# Patient Record
Sex: Female | Born: 1950 | Race: White | Hispanic: No | Marital: Married | Smoking: Former smoker
Health system: Southern US, Community
[De-identification: ages and names within clinical notes are randomized; demographics above are authoritative.]

## PROBLEM LIST (undated history)

## (undated) DIAGNOSIS — F32A Depression, unspecified: Secondary | ICD-10-CM

## (undated) DIAGNOSIS — F329 Major depressive disorder, single episode, unspecified: Secondary | ICD-10-CM

## (undated) DIAGNOSIS — I499 Cardiac arrhythmia, unspecified: Secondary | ICD-10-CM

## (undated) DIAGNOSIS — G20A1 Parkinson's disease without dyskinesia, without mention of fluctuations: Secondary | ICD-10-CM

## (undated) DIAGNOSIS — R519 Headache, unspecified: Secondary | ICD-10-CM

## (undated) DIAGNOSIS — R413 Other amnesia: Secondary | ICD-10-CM

## (undated) DIAGNOSIS — M1990 Unspecified osteoarthritis, unspecified site: Secondary | ICD-10-CM

## (undated) DIAGNOSIS — Z923 Personal history of irradiation: Secondary | ICD-10-CM

## (undated) DIAGNOSIS — Z8489 Family history of other specified conditions: Secondary | ICD-10-CM

## (undated) DIAGNOSIS — R51 Headache: Secondary | ICD-10-CM

## (undated) DIAGNOSIS — E039 Hypothyroidism, unspecified: Secondary | ICD-10-CM

## (undated) DIAGNOSIS — G20 Parkinson's disease: Secondary | ICD-10-CM

## (undated) DIAGNOSIS — I10 Essential (primary) hypertension: Secondary | ICD-10-CM

## (undated) DIAGNOSIS — C801 Malignant (primary) neoplasm, unspecified: Secondary | ICD-10-CM

## (undated) DIAGNOSIS — C50919 Malignant neoplasm of unspecified site of unspecified female breast: Secondary | ICD-10-CM

## (undated) HISTORY — DX: Major depressive disorder, single episode, unspecified: F32.9

## (undated) HISTORY — DX: Depression, unspecified: F32.A

## (undated) HISTORY — DX: Parkinson's disease without dyskinesia, without mention of fluctuations: G20.A1

## (undated) HISTORY — PX: OTHER SURGICAL HISTORY: SHX169

## (undated) HISTORY — DX: Parkinson's disease: G20

## (undated) HISTORY — PX: DILATION AND CURETTAGE OF UTERUS: SHX78

## (undated) HISTORY — DX: Unspecified osteoarthritis, unspecified site: M19.90

---

## 2004-02-22 ENCOUNTER — Ambulatory Visit: Payer: Self-pay | Admitting: Obstetrics and Gynecology

## 2005-04-02 ENCOUNTER — Ambulatory Visit: Payer: Self-pay | Admitting: Obstetrics and Gynecology

## 2006-04-08 ENCOUNTER — Ambulatory Visit: Payer: Self-pay | Admitting: Obstetrics and Gynecology

## 2007-06-02 ENCOUNTER — Ambulatory Visit: Payer: Self-pay | Admitting: Obstetrics and Gynecology

## 2008-06-04 ENCOUNTER — Ambulatory Visit: Payer: Self-pay | Admitting: Obstetrics and Gynecology

## 2009-07-12 ENCOUNTER — Ambulatory Visit: Payer: Self-pay | Admitting: Obstetrics and Gynecology

## 2009-07-15 ENCOUNTER — Emergency Department: Payer: Self-pay | Admitting: Emergency Medicine

## 2010-01-06 ENCOUNTER — Encounter: Payer: Self-pay | Admitting: Urology

## 2010-01-09 ENCOUNTER — Encounter: Payer: Self-pay | Admitting: Urology

## 2010-02-09 ENCOUNTER — Encounter: Payer: Self-pay | Admitting: Urology

## 2010-07-07 ENCOUNTER — Ambulatory Visit: Payer: Self-pay | Admitting: Obstetrics and Gynecology

## 2010-07-29 ENCOUNTER — Ambulatory Visit: Payer: Self-pay | Admitting: Obstetrics and Gynecology

## 2011-05-06 ENCOUNTER — Ambulatory Visit: Payer: Self-pay | Admitting: Obstetrics and Gynecology

## 2011-05-06 DIAGNOSIS — I10 Essential (primary) hypertension: Secondary | ICD-10-CM

## 2011-05-06 LAB — BASIC METABOLIC PANEL
Anion Gap: 11 (ref 7–16)
BUN: 19 mg/dL — ABNORMAL HIGH (ref 7–18)
Calcium, Total: 9.3 mg/dL (ref 8.5–10.1)
Co2: 26 mmol/L (ref 21–32)
Creatinine: 0.96 mg/dL (ref 0.60–1.30)
EGFR (Non-African Amer.): >60
Glucose: 85 mg/dL (ref 65–99)
Osmolality: 285 (ref 275–301)
Sodium: 142 mmol/L (ref 136–145)

## 2011-05-06 LAB — CBC
HCT: 39.5 % (ref 35.0–47.0)
HGB: 13.3 g/dL (ref 12.0–16.0)
MCH: 30.7 pg (ref 26.0–34.0)
MCHC: 33.7 g/dL (ref 32.0–36.0)
Platelet: 274 10*3/uL (ref 150–440)
WBC: 6.9 10*3/uL (ref 3.6–11.0)

## 2011-05-11 ENCOUNTER — Ambulatory Visit: Payer: Self-pay | Admitting: Obstetrics and Gynecology

## 2011-08-04 ENCOUNTER — Ambulatory Visit: Payer: Self-pay | Admitting: Obstetrics and Gynecology

## 2011-08-11 ENCOUNTER — Ambulatory Visit: Payer: Self-pay | Admitting: Obstetrics and Gynecology

## 2013-08-23 ENCOUNTER — Encounter: Payer: Self-pay | Admitting: Internal Medicine

## 2013-09-02 ENCOUNTER — Ambulatory Visit: Payer: Self-pay | Admitting: Internal Medicine

## 2013-09-07 DIAGNOSIS — M519 Unspecified thoracic, thoracolumbar and lumbosacral intervertebral disc disorder: Secondary | ICD-10-CM | POA: Insufficient documentation

## 2013-09-09 ENCOUNTER — Encounter: Payer: Self-pay | Admitting: Internal Medicine

## 2013-09-19 ENCOUNTER — Ambulatory Visit: Payer: Self-pay | Admitting: Internal Medicine

## 2013-10-02 DIAGNOSIS — E538 Deficiency of other specified B group vitamins: Secondary | ICD-10-CM | POA: Insufficient documentation

## 2013-10-11 DIAGNOSIS — M4310 Spondylolisthesis, site unspecified: Secondary | ICD-10-CM | POA: Insufficient documentation

## 2013-11-17 ENCOUNTER — Encounter: Payer: Self-pay | Admitting: Surgery

## 2013-12-10 ENCOUNTER — Encounter: Payer: Self-pay | Admitting: Surgery

## 2014-01-09 ENCOUNTER — Encounter: Payer: Self-pay | Admitting: Surgery

## 2014-02-09 DIAGNOSIS — C801 Malignant (primary) neoplasm, unspecified: Secondary | ICD-10-CM

## 2014-02-09 HISTORY — DX: Malignant (primary) neoplasm, unspecified: C80.1

## 2014-02-09 HISTORY — PX: BREAST LUMPECTOMY: SHX2

## 2014-04-02 ENCOUNTER — Encounter: Payer: Self-pay | Admitting: Neurology

## 2014-04-10 ENCOUNTER — Encounter
Admit: 2014-04-10 | Disposition: A | Discharge status: Home / Self Care | Payer: Self-pay | Attending: Neurology | Admitting: Neurology

## 2014-05-11 ENCOUNTER — Encounter
Admit: 2014-05-11 | Disposition: A | Discharge status: Home / Self Care | Payer: Self-pay | Attending: Neurology | Admitting: Neurology

## 2014-06-03 NOTE — H&P (Signed)
PATIENT NAME:  Kathy Bean, Kathy Bean MR#:  [id number] DATE OF BIRTH:  13-Mar-1950  DATE OF ADMISSION:  05/11/2011  PREOPERATIVE DIAGNOSIS: Postmenopausal bleeding.   HISTORY: Kathy Bean is a 64 year old married white female, para 2-0-0-2, using Femhrt daily for hormone replacement therapy, who presents for surgical evaluation of postmenopausal bleeding the end of February. Patient underwent ultrasound and endometrial biopsy in early March. Ultrasound on 04/20/2011 revealed uterus measuring 5.61 x 4.62 x 4.20 cm. The endometrial stripe was 5.4 mm in thickness with associated fluid within the endometrial cavity. Subsequent endometrial biopsy that had been performed on 04/08/2011 revealed benign fragments of weakly proliferative to atrophic endometrium with a pathology recommendation to have additional sampling to confirm definitive diagnosis. Patient has not had any further bleeding since that episode but she is counseled to undergo endometrial sampling through dilation and curettage.   PAST MEDICAL HISTORY:  1. Menopause.  2. Depression.  3. Hypertension.  4. Osteoarthritis of the hip.  5. Seasonal allergies.   PAST SURGICAL HISTORY: Hysteroscopy with dilation and curettage.   PAST OB HISTORY: Para 2-0-0-2, spontaneous vaginal delivery x2.   FAMILY HISTORY: Positive for depression. Positive for heart disease in mom. Positive for breast cancer in paternal aunt. No history of colon or ovary cancer in the family.   SOCIAL HISTORY: Patient does not smoke. She quit cigarettes in 2001. She does drink alcohol socially. She does not use drugs.   PAST GYN HISTORY: No history of abnormal Pap smears. No history of abnormal mammograms.   REVIEW OF SYSTEMS: Patient denies recent illness. She denies history of coagulopathy. She denies history of reactive airway disease.   MEDICATIONS: 1. Femhrt 1 tablet daily.  2. Tussionex 5 mL q.12 hours p.r.n.  3. Citalopram 20 mg daily. 4. Triamterene  hydrochlorothiazide 37.5/25, 1 tablet daily.   DRUG ALLERGIES: None.    PHYSICAL EXAMINATION:  VITAL SIGNS: Height 5 feet 8 inches, weight 183, blood pressure 128/93, heart rate 94, BMI 28.   GENERAL: Patient is a pleasant well-appearing white female with normal affect. She is alert and oriented.   HEENT: Oropharynx clear.   NECK: Supple. No thyromegaly or adenopathy.   LUNGS: Clear.   HEART: Regular rate and rhythm without murmur.   ABDOMEN: Soft, nontender. No hepatosplenomegaly. No pelvic masses. No hernias.   PELVIC: External genitalia normal. BUS normal. Vagina has decreased estrogen effect. Cervix is parous. Uterus is midplane and of normal size and shape. It is mobile. It is nontender. Adnexa are without palpable masses.   RECTAL: External rectal exam is normal.   SKIN: Without rash.   MUSCULOSKELETAL: Normal.   NEUROLOGIC: Normal.   IMPRESSION:  1. Postmenopausal bleeding.  2. Benign endometrial biopsy but limited with recommendation to obtain further sampling.  3. Slightly prominent endometrial stripe with fluid collection within the endometrial cavity of unclear significance.   PLAN: Hysteroscopy with dilation and curettage. Date of surgery 05/11/2011.    CONSENT NOTE: Kathy Bean is to undergo hysteroscopy with dilation and curettage for evaluation of postmenopausal bleeding on 05/11/2011. She is understanding of the planned procedure and is aware of and is accepting of all surgical risks which include but are not limited to bleeding, infection, uterine perforation with pelvic organ injury requiring repair, anesthesia risks, and death. All questions are answered. Informed consent is given. Patient is ready and willing to proceed with surgery as scheduled.  ____________________________ Kathy Slim Angelissa Supan, MD mad:cms D: 05/08/2011 11:20:19 ET T: 05/08/2011 12:41:07 ET JOB#: [id number]  cc: Kathy Done A.  Skyy Mcknight, MD, <Dictator> Kathy Slim Kerrick Miler  MD ELECTRONICALLY SIGNED 05/09/2011 9:23

## 2014-06-03 NOTE — Op Note (Signed)
PATIENT NAME:  Kathy Bean, BURBANO MR#:  [id number] DATE OF BIRTH:  Jul 06, 1950  DATE OF PROCEDURE:  05/11/2011  PREOPERATIVE DIAGNOSES:  1. Postmenopausal bleeding.  2. Abnormal fluid collection in the endometrial cavity on ultrasound.   POSTOPERATIVE DIAGNOSES: 1. Postmenopausal bleeding.  2. Abnormal fluid collection in the endometrial cavity on ultrasound.  3. Uterine prolapse.  4. Traction cystocele, second-degree.   OPERATIVE PROCEDURES:  1. Hysteroscopy.  2. Dilation and curettage of the endometrium.   SURGEON: Alanda Slim. Houa Nie, MD   FIRST ASSISTANT: None.   ANESTHESIA: General.   INDICATIONS: [name] is a 64 year old menopausal female who presents for surgical management of postmenopausal bleeding. Preoperative endometrial biopsy was benign but scant. Further tissue diagnosis is recommended. Ultrasound preoperatively revealed an endometrial cavity with fluid collection and an endometrial thickness of 5.4 mm.   FINDINGS AT SURGERY: The patient has a gynecoid pelvis. Uterus is prolapsed to within 2 cm of the introitus. There is a second degree traction cystocele present. The uterus sounded to 8 cm. The endometrial cavity appeared to have atrophic endometrial tissue.   DESCRIPTION OF PROCEDURE: The patient was brought to the operating room where she was placed in the supine position. General anesthesia was induced without difficulty. She was placed in the dorsal lithotomy position using the candy-cane stirrups. A Betadine perineal intravaginal prep and drape was performed in the standard fashion. Red Robinson catheter was used to drain 100 mL of urine from the bladder. A Sims retractor was placed into the vagina and a single-tooth tenaculum was placed on the anterior lip of the cervix. The cervix was sounded to 8 cm. The Hanks dilators were used up to a #20 [place] caliber to dilate the endocervical canal. The [facility] hysteroscope using lactated Ringer's as irrigant was used to  survey the endometrial cavity. Scant tissue was noted without any significant intrauterine lesions. Both smooth and serrated curettes were then used following hysteroscopy to curettage the endometrial cavity. Scant tissue was obtained. Repeat hysteroscopy revealed no significant residual tissue left behind. All instrumentation was removed from the vagina. The patient was then awakened, mobilized, and taken to the recovery room in satisfactory condition.   The patient is an excellent transvaginal hysterectomy, anterior colporrhaphy candidate if indicated.   ____________________________ Alanda Slim. Kamara Allan, MD mad:drc D: 05/11/2011 14:12:10 ET T: 05/11/2011 15:06:28 ET JOB#: [id number]  cc: Hassell Done A. Kristeena Meineke, MD, <Dictator> Alanda Slim Indi Willhite MD ELECTRONICALLY SIGNED 05/19/2011 13:32

## 2014-10-11 HISTORY — PX: BREAST BIOPSY: SHX20

## 2014-10-16 ENCOUNTER — Other Ambulatory Visit: Payer: Self-pay | Admitting: Internal Medicine

## 2014-10-16 DIAGNOSIS — Z1231 Encounter for screening mammogram for malignant neoplasm of breast: Secondary | ICD-10-CM

## 2014-10-23 ENCOUNTER — Ambulatory Visit
Admission: RE | Admit: 2014-10-23 | Discharge: 2014-10-23 | Disposition: A | Discharge status: Home / Self Care | Payer: Commercial Managed Care - PPO | Source: Ambulatory Visit | Attending: Internal Medicine | Admitting: Internal Medicine

## 2014-10-23 DIAGNOSIS — Z1231 Encounter for screening mammogram for malignant neoplasm of breast: Secondary | ICD-10-CM | POA: Insufficient documentation

## 2014-10-26 ENCOUNTER — Other Ambulatory Visit: Payer: Self-pay | Admitting: Internal Medicine

## 2014-10-26 DIAGNOSIS — R928 Other abnormal and inconclusive findings on diagnostic imaging of breast: Secondary | ICD-10-CM

## 2014-11-02 ENCOUNTER — Ambulatory Visit
Admission: RE | Admit: 2014-11-02 | Discharge: 2014-11-02 | Disposition: A | Discharge status: Home / Self Care | Payer: Commercial Managed Care - PPO | Source: Ambulatory Visit | Attending: Internal Medicine | Admitting: Internal Medicine

## 2014-11-02 DIAGNOSIS — R928 Other abnormal and inconclusive findings on diagnostic imaging of breast: Secondary | ICD-10-CM

## 2014-11-02 DIAGNOSIS — N63 Unspecified lump in breast: Secondary | ICD-10-CM | POA: Insufficient documentation

## 2014-11-06 ENCOUNTER — Other Ambulatory Visit: Payer: Self-pay | Admitting: Internal Medicine

## 2014-11-06 DIAGNOSIS — N63 Unspecified lump in unspecified breast: Secondary | ICD-10-CM

## 2014-11-08 ENCOUNTER — Ambulatory Visit
Admission: RE | Admit: 2014-11-08 | Discharge: 2014-11-08 | Disposition: A | Discharge status: Home / Self Care | Payer: Commercial Managed Care - PPO | Source: Ambulatory Visit | Attending: Internal Medicine | Admitting: Internal Medicine

## 2014-11-08 ENCOUNTER — Other Ambulatory Visit: Payer: Self-pay | Admitting: Diagnostic Radiology

## 2014-11-08 DIAGNOSIS — N63 Unspecified lump in unspecified breast: Secondary | ICD-10-CM

## 2014-11-10 DIAGNOSIS — C50919 Malignant neoplasm of unspecified site of unspecified female breast: Secondary | ICD-10-CM

## 2014-11-10 HISTORY — DX: Malignant neoplasm of unspecified site of unspecified female breast: C50.919

## 2014-11-15 LAB — SURGICAL PATHOLOGY

## 2014-11-20 ENCOUNTER — Other Ambulatory Visit: Payer: Self-pay | Admitting: Surgery

## 2014-11-20 ENCOUNTER — Inpatient Hospital Stay: Payer: Commercial Managed Care - PPO | Attending: Oncology | Admitting: Oncology

## 2014-11-20 ENCOUNTER — Encounter: Payer: Self-pay | Admitting: Oncology

## 2014-11-20 VITALS — BP 120/84 | HR 79 | Temp 96.8°F | Resp 18 | Ht 64.76 in | Wt 180.1 lb

## 2014-11-20 DIAGNOSIS — Z87891 Personal history of nicotine dependence: Secondary | ICD-10-CM | POA: Diagnosis not present

## 2014-11-20 DIAGNOSIS — N6320 Unspecified lump in the left breast, unspecified quadrant: Secondary | ICD-10-CM

## 2014-11-20 DIAGNOSIS — Z79899 Other long term (current) drug therapy: Secondary | ICD-10-CM | POA: Insufficient documentation

## 2014-11-20 DIAGNOSIS — F418 Other specified anxiety disorders: Secondary | ICD-10-CM | POA: Insufficient documentation

## 2014-11-20 DIAGNOSIS — C50312 Malignant neoplasm of lower-inner quadrant of left female breast: Secondary | ICD-10-CM | POA: Diagnosis not present

## 2014-11-20 DIAGNOSIS — M1990 Unspecified osteoarthritis, unspecified site: Secondary | ICD-10-CM | POA: Diagnosis not present

## 2014-11-20 DIAGNOSIS — C50912 Malignant neoplasm of unspecified site of left female breast: Secondary | ICD-10-CM

## 2014-11-20 DIAGNOSIS — Z803 Family history of malignant neoplasm of breast: Secondary | ICD-10-CM | POA: Insufficient documentation

## 2014-11-20 DIAGNOSIS — I10 Essential (primary) hypertension: Secondary | ICD-10-CM | POA: Insufficient documentation

## 2014-11-20 DIAGNOSIS — G20 Parkinson's disease: Secondary | ICD-10-CM

## 2014-11-20 NOTE — Progress Notes (Signed)
  Oncology Nurse Navigator Documentation    Navigator Encounter Type: Initial MedOnc;Treatment;Education (11/20/14 1600)   Treatment Phase: Treatment (11/20/14 1600) Barriers/Navigation Needs: Education (11/20/14 1600) Education: Newly Diagnosed Cancer Education (11/20/14 1600) Interventions: Education Method (11/20/14 1600)     Education Method: Teach-back;Verbal;Written (11/20/14 1600)  Support Groups/Services: Breast (11/20/14 1600)   Time Spent with Patient: 60 (11/20/14 1600)  Met with patient , husband at initial Med-Onc visit with Dr. Grayland Ormond  Coordinated pre-admit testing appointment.  Gave Breast Cancer Treatment Handbook/folder with hospital services.

## 2014-11-21 ENCOUNTER — Telehealth: Payer: Self-pay | Admitting: *Deleted

## 2014-11-21 NOTE — Telephone Encounter (Signed)
Call placed to patient to discuss Oncotype testing that was ordered yesterday. Patient states per insurance she would have high co-pay. Per discussion with Dr. Grayland Ormond test is not of necessity to guide treatment in this particular case, patient states she will place call to [facility] to let them know to halt processing.

## 2014-11-21 NOTE — Telephone Encounter (Signed)
Called to report that Dr Grayland Ormond ordered a lab test was ordered and the lab called her and told her how much it would cost her and asked if she wanted the test run. She is requesting that she be called back to explain what was ordere dand what is going on

## 2014-11-22 ENCOUNTER — Encounter
Admission: RE | Admit: 2014-11-22 | Discharge: 2014-11-22 | Disposition: A | Discharge status: Home / Self Care | Payer: Commercial Managed Care - PPO | Source: Ambulatory Visit | Attending: Surgery | Admitting: Surgery

## 2014-11-22 ENCOUNTER — Other Ambulatory Visit: Payer: Self-pay | Admitting: Surgery

## 2014-11-22 DIAGNOSIS — C50912 Malignant neoplasm of unspecified site of left female breast: Secondary | ICD-10-CM | POA: Insufficient documentation

## 2014-11-22 DIAGNOSIS — Z01818 Encounter for other preprocedural examination: Secondary | ICD-10-CM | POA: Diagnosis not present

## 2014-11-22 DIAGNOSIS — N6320 Unspecified lump in the left breast, unspecified quadrant: Secondary | ICD-10-CM

## 2014-11-22 HISTORY — DX: Cardiac arrhythmia, unspecified: I49.9

## 2014-11-22 HISTORY — DX: Family history of other specified conditions: Z84.89

## 2014-11-22 HISTORY — DX: Headache: R51

## 2014-11-22 HISTORY — DX: Essential (primary) hypertension: I10

## 2014-11-22 HISTORY — DX: Headache, unspecified: R51.9

## 2014-11-22 HISTORY — DX: Malignant (primary) neoplasm, unspecified: C80.1

## 2014-11-22 NOTE — Patient Instructions (Signed)
  Your procedure is scheduled on:  November 29 2014  Thursday Report to [facility]   Remember: Instructions that are not followed completely may result in serious medical risk, up to and including death, or upon the discretion of your surgeon and anesthesiologist your surgery may need to be rescheduled.    __x__ 1. Do not eat food or drink liquids after midnight. No gum chewing or hard candies.     ___x_ 2. No Alcohol for 24 hours before or after surgery.   ____ 3. Bring all medications with you on the day of surgery if instructed.    __x__ 4. Notify your doctor if there is any change in your medical condition     (cold, fever, infections).     Do not wear jewelry, make-up, hairpins, clips or nail polish.  Do not wear lotions, powders, or perfumes. You may wear deodorant.  Do not shave 48 hours prior to surgery. Men may shave face and neck.  Do not bring valuables to the hospital.    [facility] is not responsible for any belongings or valuables.               Contacts, dentures or bridgework may not be worn into surgery.  Leave your suitcase in the car. After surgery it may be brought to your room.  For patients admitted to the hospital, discharge time is determined by your                treatment team.   Patients discharged the day of surgery will not be allowed to drive home.     __x__ Take these medicines the morning of surgery with A SIP OF WATER:    1. sinemet    ____ Fleet Enema (as directed)   __x__ Use CHG Soap as directed  ____ Use inhalers on the day of surgery  ____ Stop metformin 2 days prior to surgery    ____ Take 1/2 of usual insulin dose the night before surgery and none on the morning of surgery.   ____ Stop Coumadin/Plavix/aspirin on  Not taking  _x__ Stop Anti-inflammatories on may take tylenol   ____ Stop supplements until after surgery.    ____ Bring C-Pap to the hospital.

## 2014-11-29 ENCOUNTER — Encounter: Payer: Self-pay | Admitting: *Deleted

## 2014-11-29 ENCOUNTER — Ambulatory Visit
Admission: RE | Admit: 2014-11-29 | Discharge: 2014-11-29 | Disposition: A | Discharge status: Home / Self Care | Payer: Commercial Managed Care - PPO | Source: Ambulatory Visit | Attending: Surgery | Admitting: Surgery

## 2014-11-29 ENCOUNTER — Ambulatory Visit: Payer: Commercial Managed Care - PPO | Admitting: Anesthesiology

## 2014-11-29 ENCOUNTER — Other Ambulatory Visit: Payer: Self-pay | Admitting: Internal Medicine

## 2014-11-29 ENCOUNTER — Ambulatory Visit
Admission: RE | Admit: 2014-11-29 | Discharge: 2014-11-29 | Disposition: A | Discharge status: Home / Self Care | Payer: Commercial Managed Care - PPO | Source: Ambulatory Visit | Attending: Internal Medicine | Admitting: Internal Medicine

## 2014-11-29 ENCOUNTER — Encounter: Payer: Self-pay | Admitting: Anesthesiology

## 2014-11-29 ENCOUNTER — Encounter
Admission: RE | Disposition: A | Discharge status: Home / Self Care | Payer: Self-pay | Source: Ambulatory Visit | Attending: Surgery

## 2014-11-29 ENCOUNTER — Encounter
Admission: RE | Admit: 2014-11-29 | Discharge: 2014-11-29 | Disposition: A | Discharge status: Home / Self Care | Payer: Commercial Managed Care - PPO | Source: Ambulatory Visit | Attending: Surgery | Admitting: Surgery

## 2014-11-29 DIAGNOSIS — Z8249 Family history of ischemic heart disease and other diseases of the circulatory system: Secondary | ICD-10-CM | POA: Insufficient documentation

## 2014-11-29 DIAGNOSIS — I10 Essential (primary) hypertension: Secondary | ICD-10-CM | POA: Insufficient documentation

## 2014-11-29 DIAGNOSIS — R51 Headache: Secondary | ICD-10-CM | POA: Diagnosis not present

## 2014-11-29 DIAGNOSIS — N6320 Unspecified lump in the left breast, unspecified quadrant: Secondary | ICD-10-CM

## 2014-11-29 DIAGNOSIS — C50312 Malignant neoplasm of lower-inner quadrant of left female breast: Secondary | ICD-10-CM | POA: Insufficient documentation

## 2014-11-29 DIAGNOSIS — Z79899 Other long term (current) drug therapy: Secondary | ICD-10-CM | POA: Diagnosis not present

## 2014-11-29 DIAGNOSIS — IMO0002 Reserved for concepts with insufficient information to code with codable children: Secondary | ICD-10-CM

## 2014-11-29 DIAGNOSIS — Z803 Family history of malignant neoplasm of breast: Secondary | ICD-10-CM | POA: Diagnosis not present

## 2014-11-29 DIAGNOSIS — F329 Major depressive disorder, single episode, unspecified: Secondary | ICD-10-CM | POA: Diagnosis not present

## 2014-11-29 DIAGNOSIS — R229 Localized swelling, mass and lump, unspecified: Principal | ICD-10-CM

## 2014-11-29 DIAGNOSIS — C50912 Malignant neoplasm of unspecified site of left female breast: Secondary | ICD-10-CM | POA: Diagnosis present

## 2014-11-29 DIAGNOSIS — M1990 Unspecified osteoarthritis, unspecified site: Secondary | ICD-10-CM | POA: Diagnosis not present

## 2014-11-29 DIAGNOSIS — G20 Parkinson's disease: Secondary | ICD-10-CM | POA: Diagnosis not present

## 2014-11-29 DIAGNOSIS — Z87891 Personal history of nicotine dependence: Secondary | ICD-10-CM | POA: Insufficient documentation

## 2014-11-29 DIAGNOSIS — R599 Enlarged lymph nodes, unspecified: Secondary | ICD-10-CM | POA: Insufficient documentation

## 2014-11-29 HISTORY — PX: SENTINEL NODE BIOPSY: SHX6608

## 2014-11-29 HISTORY — DX: Malignant neoplasm of unspecified site of unspecified female breast: C50.919

## 2014-11-29 HISTORY — PX: BREAST EXCISIONAL BIOPSY: SUR124

## 2014-11-29 HISTORY — PX: PARTIAL MASTECTOMY WITH NEEDLE LOCALIZATION: SHX6008

## 2014-11-29 SURGERY — PARTIAL MASTECTOMY WITH NEEDLE LOCALIZATION
Anesthesia: General | Laterality: Left | Wound class: Clean

## 2014-11-29 MED ORDER — OXYCODONE HCL 5 MG PO TABS: 5.0000 mg | ORAL_TABLET | Freq: Once | ORAL | Status: DC | PRN

## 2014-11-29 MED ORDER — ACETAMINOPHEN 10 MG/ML IV SOLN
INTRAVENOUS | Status: AC
  Filled 2014-11-29: qty 100

## 2014-11-29 MED ORDER — MIDAZOLAM HCL 5 MG/5ML IJ SOLN
INTRAMUSCULAR | Status: DC | PRN
  Administered 2014-11-29: 2 mg via INTRAVENOUS

## 2014-11-29 MED ORDER — FAMOTIDINE 20 MG PO TABS: 20.0000 mg | ORAL_TABLET | Freq: Once | ORAL | Status: DC

## 2014-11-29 MED ORDER — BUPIVACAINE-EPINEPHRINE 0.5% -1:200000 IJ SOLN
INTRAMUSCULAR | Status: DC | PRN
  Administered 2014-11-29: 25 mL

## 2014-11-29 MED ORDER — HYDROCODONE-ACETAMINOPHEN 5-325 MG PO TABS: 1.0000 | ORAL_TABLET | ORAL | Status: DC | PRN

## 2014-11-29 MED ORDER — EPHEDRINE SULFATE 50 MG/ML IJ SOLN
INTRAMUSCULAR | Status: DC | PRN
  Administered 2014-11-29 (×3): 10 mg via INTRAVENOUS

## 2014-11-29 MED ORDER — OXYCODONE HCL 5 MG/5ML PO SOLN: 5.0000 mg | Freq: Once | ORAL | Status: DC | PRN

## 2014-11-29 MED ORDER — LACTATED RINGERS IV SOLN: INTRAVENOUS | Status: DC

## 2014-11-29 MED ORDER — FENTANYL CITRATE (PF) 100 MCG/2ML IJ SOLN
INTRAMUSCULAR | Status: DC | PRN
  Administered 2014-11-29 (×2): 50 ug via INTRAVENOUS

## 2014-11-29 MED ORDER — TECHNETIUM TC 99M SULFUR COLLOID
1.0080 | Freq: Once | INTRAVENOUS | Status: DC | PRN
  Administered 2014-11-29: 1.008 via INTRAVENOUS
  Filled 2014-11-29: qty 1.01

## 2014-11-29 MED ORDER — BUPIVACAINE-EPINEPHRINE (PF) 0.5% -1:200000 IJ SOLN
INTRAMUSCULAR | Status: AC
  Filled 2014-11-29: qty 30

## 2014-11-29 MED ORDER — LIDOCAINE HCL (CARDIAC) 20 MG/ML IV SOLN
INTRAVENOUS | Status: DC | PRN
  Administered 2014-11-29: 80 mg via INTRAVENOUS

## 2014-11-29 MED ORDER — LACTATED RINGERS IV SOLN
INTRAVENOUS | Status: DC | PRN
  Administered 2014-11-29: 11:00:00 via INTRAVENOUS

## 2014-11-29 MED ORDER — DEXAMETHASONE SODIUM PHOSPHATE 10 MG/ML IJ SOLN
INTRAMUSCULAR | Status: DC | PRN
  Administered 2014-11-29: 8 mg via INTRAVENOUS

## 2014-11-29 MED ORDER — ACETAMINOPHEN 10 MG/ML IV SOLN
INTRAVENOUS | Status: DC | PRN
  Administered 2014-11-29: 1000 mg via INTRAVENOUS

## 2014-11-29 MED ORDER — ONDANSETRON HCL 4 MG/2ML IJ SOLN
INTRAMUSCULAR | Status: DC | PRN
  Administered 2014-11-29: 4 mg via INTRAVENOUS

## 2014-11-29 MED ORDER — FENTANYL CITRATE (PF) 100 MCG/2ML IJ SOLN: 25.0000 ug | INTRAMUSCULAR | Status: DC | PRN

## 2014-11-29 MED ORDER — PROPOFOL 10 MG/ML IV BOLUS
INTRAVENOUS | Status: DC | PRN
  Administered 2014-11-29: 150 mg via INTRAVENOUS

## 2014-11-29 SURGICAL SUPPLY — 31 items
BLADE SURG 15 STRL LF DISP TIS (BLADE) ×1 IMPLANT
BLADE SURG 15 STRL SS (BLADE) ×2
CANISTER SUCT 1200ML W/VALVE (MISCELLANEOUS) ×3 IMPLANT
CHLORAPREP W/TINT 26ML (MISCELLANEOUS) ×3 IMPLANT
CNTNR SPEC 2.5X3XGRAD LEK (MISCELLANEOUS) ×1
CONT SPEC 4OZ STER OR WHT (MISCELLANEOUS) ×2
CONTAINER SPEC 2.5X3XGRAD LEK (MISCELLANEOUS) ×1 IMPLANT
DEVICE DUBIN SPECIMEN MAMMOGRA (MISCELLANEOUS) ×3 IMPLANT
DRAPE PED LAPAROTOMY (DRAPES) ×3 IMPLANT
GLOVE BIO SURGEON STRL SZ7.5 (GLOVE) ×15 IMPLANT
GOWN STRL REUS W/ TWL LRG LVL3 (GOWN DISPOSABLE) ×3 IMPLANT
GOWN STRL REUS W/TWL LRG LVL3 (GOWN DISPOSABLE) ×6
KIT RM TURNOVER STRD PROC AR (KITS) ×3 IMPLANT
LABEL OR SOLS (LABEL) IMPLANT
LIQUID BAND (GAUZE/BANDAGES/DRESSINGS) ×3 IMPLANT
MARGIN MAP 10MM (MISCELLANEOUS) ×3 IMPLANT
NDL SAFETY 18GX1.5 (NEEDLE) ×3 IMPLANT
NDL SAFETY 22GX1.5 (NEEDLE) ×3 IMPLANT
NEEDLE HYPO 25X1 1.5 SAFETY (NEEDLE) ×3 IMPLANT
PACK BASIN MINOR ARMC (MISCELLANEOUS) ×3 IMPLANT
PAD GROUND ADULT SPLIT (MISCELLANEOUS) ×3 IMPLANT
SLEVE PROBE SENORX GAMMA FIND (MISCELLANEOUS) ×3 IMPLANT
SPONGE XRAY 4X4 16PLY STRL (MISCELLANEOUS) ×3 IMPLANT
SUT CHROMIC 4 0 RB 1X27 (SUTURE) ×3 IMPLANT
SUT ETHILON 3-0 FS-10 30 BLK (SUTURE) ×3
SUT MNCRL 4-0 (SUTURE) ×2
SUT MNCRL 4-0 27XMFL (SUTURE) ×1
SUTURE EHLN 3-0 FS-10 30 BLK (SUTURE) ×1 IMPLANT
SUTURE MNCRL 4-0 27XMF (SUTURE) ×1 IMPLANT
SYRINGE 10CC LL (SYRINGE) ×3 IMPLANT
WATER STERILE IRR 1000ML POUR (IV SOLUTION) ×3 IMPLANT

## 2014-11-29 NOTE — Transfer of Care (Signed)
Immediate Anesthesia Transfer of Care Note  Patient: Kathy Bean  Procedure(s) Performed: Procedure(s): PARTIAL MASTECTOMY WITH NEEDLE LOCALIZATION (Left) SENTINEL NODE BIOPSY (Left)  Patient Location: PACU  Anesthesia Type:General  Level of Consciousness: sedated  Airway & Oxygen Therapy: Patient Spontanous Breathing and Patient connected to face mask oxygen  Post-op Assessment: Report given to RN and Post -op Vital signs reviewed and stable  Post vital signs: Reviewed and stable  Last Vitals:  Filed Vitals:   11/29/14 1258  BP: 129/89  Pulse: 81  Temp: 36.8 C  Resp: 19    Complications: No apparent anesthesia complications

## 2014-11-29 NOTE — OR Nursing (Signed)
Dr. Smith visited. 

## 2014-11-29 NOTE — H&P (Signed)
  She reports no change in condition since the day of the office visit.  She has had preoperative injection of reactive technetium sulfur colloid. Insertion of Kopan's wire. Mammogram images reviewed.  I discussed plan for left partial mastectomy with axillary sentinel lymph node biopsy

## 2014-11-29 NOTE — Op Note (Signed)
OPERATIVE REPORT  PREOPERATIVE  DIAGNOSIS: . Carcinoma of the left breast  POSTOPERATIVE DIAGNOSIS: . Carcinoma of the left breast  PROCEDURE: . Left partial mastectomy with axillary sentinel lymph node biopsy  ANESTHESIA:  General  SURGEON: Rochel Brome  MD   INDICATIONS: . She had recent biopsy findings of a cancer of the 8:00 position of the left breast.   She had preoperative insertion of Kopan's wire. She had a injection of radioactive technetium sulfur colloid.  With the patient on the operating table in the supine position under general anesthesia of the left arm was placed on a lateral arm support. The dressing was removed from the left breast exposing the Kopan's wire which entered the breast at the 8:00 position 8 cm from the nipple. The Kopan's wire was cut 2 cm from the skin. The breast and surrounding chest wall were prepared with ChloraPrep and draped in a sterile manner.  A curvilinear incision was made from 7:00 to 10:00 position of the left breast  6 cm from the nipple. A narrow ellipse of skin was excised. The dissection was carried down using electrocautery for hemostasis and encountered the Kopan's wire. There was some firmness within the breast surrounding the Kopan's wire. A portion of tissue was excised which was approximately 4 x 4 x 3 cm. It was labeled with margin maps suturing markers to the specimen to indicate the medial lateral cranial caudal and deep margins. The Kopan's wire was left in the specimen. The specimen was submitted for specimen mammogram and pathology. The pathologist did later called back to report that the margins appeared to be satisfactory.  The gamma counter was used to probe the axilla demonstrating location of radioactivity in the inferior aspect of the left axilla. An oblique incision was made in the inferior aspect of the axilla and carried down through subcutaneous tissues. Numerous small bleeding points were cauterized. Dissection was  carried down through superficial fascia deep within the breast adjacent to the rib cage demonstrated a focal point of radioactivity. There was a palpable lymph node which was removed with some surrounding fatty tissue. The ex vivo count was in the range of 1200-1600 counts per second. The background count was less than 100 counts per second with no focal area of radioactivity. There was no remaining palpable mass. Hemostasis was intact. The axillary wound was closed with interrupted 4-0 chromic subcutaneous sutures and 4-0 Monocryl subcuticular suture.  The partial mastectomy wound was reinspected and determined hemostasis was intact. Subcutaneous tissues were approximated with 4-0 chromic and the skin was closed with running 4-0 Monocryl subcutaneous suture. Both wounds were treated with LiquiBand.  The patient tolerated the procedure satisfactorily and was prepared for transfer to the recovery room  [facility].D.

## 2014-11-29 NOTE — Anesthesia Postprocedure Evaluation (Signed)
  Anesthesia Post-op Note  Patient: Kathy Bean  Procedure(s) Performed: Procedure(s): PARTIAL MASTECTOMY WITH NEEDLE LOCALIZATION (Left) SENTINEL NODE BIOPSY (Left)  Anesthesia type:General  Patient location: PACU  Post pain: Pain level controlled  Post assessment: Post-op Vital signs reviewed, Patient's Cardiovascular Status Stable, Respiratory Function Stable, Patent Airway and No signs of Nausea or vomiting  Post vital signs: Reviewed and stable  Last Vitals:  Filed Vitals:   11/29/14 1343  BP: 126/81  Pulse: 93  Temp: 36.9 C  Resp: 12    Level of consciousness: awake, alert  and patient cooperative  Complications: No apparent anesthesia complications

## 2014-11-29 NOTE — Anesthesia Procedure Notes (Signed)
Procedure Name: LMA Insertion Date/Time: 11/29/2014 11:02 AM Performed by: Delaney Meigs Pre-anesthesia Checklist: Patient identified, Emergency Drugs available, Suction available, Patient being monitored and Timeout performed Patient Re-evaluated:Patient Re-evaluated prior to inductionOxygen Delivery Method: Circle system utilized and Simple face mask Preoxygenation: Pre-oxygenation with 100% oxygen Intubation Type: IV induction Ventilation: Mask ventilation without difficulty LMA: LMA inserted LMA Size: 3.5 Number of attempts: 1 Placement Confirmation: breath sounds checked- equal and bilateral and positive ETCO2 Tube secured with: Tape

## 2014-11-29 NOTE — Anesthesia Preprocedure Evaluation (Addendum)
Anesthesia Evaluation  Patient identified by MRN, date of birth, ID band Patient awake    Reviewed: Allergy & Precautions, H&P , NPO status , Patient's Chart, lab work & pertinent test results  History of Anesthesia Complications (+) Family history of anesthesia reaction and history of anesthetic complications  Airway Mallampati: II  TM Distance: >3 FB Neck ROM: full    Dental  (+) Poor Dentition, Chipped   Pulmonary neg shortness of breath, former smoker,    Pulmonary exam normal breath sounds clear to auscultation       Cardiovascular Exercise Tolerance: Good hypertension, (-) angina(-) Past MI and (-) DOE Normal cardiovascular exam+ dysrhythmias  Rhythm:regular Rate:Normal     Neuro/Psych  Headaches, PSYCHIATRIC DISORDERS Depression    GI/Hepatic negative GI ROS, Neg liver ROS,   Endo/Other  negative endocrine ROS  Renal/GU negative Renal ROS  negative genitourinary   Musculoskeletal negative musculoskeletal ROS (+) Arthritis ,   Abdominal   Peds negative pediatric ROS (+)  Hematology negative hematology ROS (+)   Anesthesia Other Findings Past Medical History:   Osteoarthritis                                               Depression                                                   Parkinson disease (HCC)                                      Family history of adverse reaction to anesthes*                Comment:brother had difficulty going under anesthseia   Hypertension                                                 Dysrhythmia                                                    Comment:heart racing   Headache                                                       Comment:hx of migraines   period induced   Cancer ([place])                                    2016           Comment:left breast   Breast cancer ([place])  11/2014        Comment:left  Past Surgical History:   DILATION AND  CURETTAGE OF UTERUS                              Laminectomy Posterior Lumbar Facetectomy & For*                 Comment:BILATERAL LUMBAR DECOMPRESSION L2-3, L3-4,               L4-5; Surgeon: Loni Dolly, MD;               Location: [facility]; Service: Orthopedics;               Laterality: Bilateral;   Laminectomy Posterior Cervicle Decomp W/Facete*                 Comment:[id number] x 3...Marland KitchenMarland KitchenEXCISION CENTRAL LEFT [place]               HERNIATION L 2-3; Surgeon: Loni Dolly,              MD; Location: [facility]; Service: Orthopedics;               Laterality: Left   BREAST BIOPSY                                   Left 10/2014         Comment:positive  BMI    Body Mass Index   27.45 kg/m 2    Signs and symptoms suggestive of sleep apnea    Reproductive/Obstetrics negative OB ROS                            Anesthesia Physical Anesthesia Plan  ASA: III  Anesthesia Plan: General   Post-op Pain Management:    Induction:   Airway Management Planned:   Additional Equipment:   Intra-op Plan:   Post-operative Plan:   Informed Consent: I have reviewed the patients History and Physical, chart, labs and discussed the procedure including the risks, benefits and alternatives for the proposed anesthesia with the patient or authorized representative who has indicated his/her understanding and acceptance.   Dental Advisory Given  Plan Discussed with: Anesthesiologist, CRNA and Surgeon  Anesthesia Plan Comments:         Anesthesia Quick Evaluation

## 2014-11-29 NOTE — Discharge Instructions (Signed)
AMBULATORY SURGERY  DISCHARGE INSTRUCTIONS   1) The drugs that you were given will stay in your system until tomorrow so for the next 24 hours you should not:  A) Drive an automobile B) Make any legal decisions C) Drink any alcoholic beverage   2) You may resume regular meals tomorrow.  Today it is better to start with liquids and gradually work up to solid foods.  You may eat anything you prefer, but it is better to start with liquids, then soup and crackers, and gradually work up to solid foods.   3) Please notify your doctor immediately if you have any unusual bleeding, trouble breathing, redness and pain at the surgery site, drainage, fever, or pain not relieved by medication.    4) Additional Instructions:    Take Tylenol or Vicodin if needed for pain.  May shower.  Wear brought for support.    Please contact your physician with any problems or Same Day Surgery at [phone], Monday through Friday 6 am to 4 pm, or  at [facility] number at [phone].

## 2014-12-03 LAB — SURGICAL PATHOLOGY

## 2014-12-04 ENCOUNTER — Encounter: Payer: Self-pay | Admitting: *Deleted

## 2014-12-04 DIAGNOSIS — C50312 Malignant neoplasm of lower-inner quadrant of left female breast: Secondary | ICD-10-CM | POA: Insufficient documentation

## 2014-12-04 NOTE — Progress Notes (Signed)
  Oncology Nurse Navigator Documentation    Navigator Encounter Type: Telephone (12/04/14 1100) Patient Visit Type: Follow-up (12/04/14 1100) Treatment Phase: Other (post surgery call) (12/04/14 1100) Barriers/Navigation Needs: Education (12/04/14 1100) Education: Other (wants final path results) (12/04/14 1100) Interventions: Coordination of Care (to call Dr. Tamala Julian for results) (12/04/14 1100)            Time Spent with Patient: 15 (12/04/14 1100)

## 2014-12-04 NOTE — Progress Notes (Signed)
Kathy Bean  [phone] [phone]  ID: Kathy Bean OB: [birth year]  MR#: [id number]  HAL#:[id number]  Patient Care Team: Rusty Aus, MD as PCP - General (Internal Medicine)  CHIEF COMPLAINT:  Chief Complaint  Patient presents with  . New Evaluation    breast cancer    INTERVAL HISTORY: Patient is a 64 year old female recently had an abnormal mammogram and subsequent biopsy revealed invasive breast cancer.  Currently, she is anxious but otherwise feels well. She has no neurologic complaints. She denies any recent fevers or illnesses. She denies any pain. She has good appetite and denies weight loss. She has no chest pain or shortness of breath. She denies any tenderness at her biopsy site. She has no nausea, vomiting, constipation, or diarrhea. She has no urinary complaints. Patient feels at her baseline and offers no specific complaints today.  REVIEW OF SYSTEMS:   Review of Systems  Constitutional: Negative.   Respiratory: Negative.   Cardiovascular: Negative.   Gastrointestinal: Negative.   Musculoskeletal: Negative.   Neurological: Negative.     As per HPI. Otherwise, a complete review of systems is negatve.  PAST MEDICAL HISTORY: Past Medical History  Diagnosis Date  . Osteoarthritis   . Depression   . Parkinson disease ([place])   . Family history of adverse reaction to anesthesia     brother had difficulty going under anesthseia  . Hypertension   . Dysrhythmia     heart racing  . Headache     hx of migraines   period induced  . Cancer [facility] 2016    left breast  . Breast cancer ([place]) 11/2014    left    PAST SURGICAL HISTORY: Past Surgical History  Procedure Laterality Date  . Dilation and curettage of uterus    . Laminectomy posterior lumbar facetectomy & foraminotomy w/decomp      BILATERAL LUMBAR DECOMPRESSION L2-3, L3-4, L4-5; Surgeon: Loni Dolly, MD; Location: [place]; Service: Orthopedics; Laterality:  Bilateral;  . Laminectomy posterior cervicle decomp w/facetectomy & foraminotomy      [id number] x 3...Marland KitchenMarland KitchenEXCISION CENTRAL LEFT [place] HERNIATION L 2-3; Surgeon: Loni Dolly, MD; Location: [place]; Service: Orthopedics; Laterality: Left  . Breast biopsy Left 10/2014    positive  . Partial mastectomy with needle localization Left 11/29/2014    Procedure: PARTIAL MASTECTOMY WITH NEEDLE LOCALIZATION;  Surgeon: Leonie Green, MD;  Location: [facility];  Service: General;  Laterality: Left;  . Sentinel node biopsy Left 11/29/2014    Procedure: SENTINEL NODE BIOPSY;  Surgeon: Leonie Green, MD;  Location: [facility];  Service: General;  Laterality: Left;    FAMILY HISTORY Family History  Problem Relation Age of Onset  . Breast cancer Maternal Aunt   . Breast cancer Paternal Aunt   . Coronary artery disease Mother   . Heart attack Mother   . Coronary artery disease Paternal Grandmother        ADVANCED DIRECTIVES:    HEALTH MAINTENANCE: Social History  Substance Use Topics  . Smoking status: Former Smoker    Quit date: 11/22/1999  . Smokeless tobacco: Not on file  . Alcohol Use: 8.4 oz/week    14 Glasses of wine per week     Colonoscopy:  PAP:  Bone density:  Lipid panel:  No Known Allergies  Current Outpatient Prescriptions  Medication Sig Dispense Refill  . carbidopa-levodopa (SINEMET IR) 10-100 MG tablet Take 1 tablet by mouth 3 (three) times daily.     Marland Kitchen  citalopram (CELEXA) 20 MG tablet TAKE ONE TABLET BY MOUTH DAILY. (DEPRESSION) *NO CHILDPROOF CAPS!* take evening    . Cyanocobalamin (RA VITAMIN B-12 TR) 1000 MCG TBCR Take 1,000 mcg by mouth every morning.     Marland Kitchen dextromethorphan (DELSYM) 30 MG/5ML liquid Take by mouth at bedtime as needed.     . diphenhydrAMINE (BENADRYL) 25 mg capsule Take 50 mg by mouth at bedtime.     . fexofenadine (ALLEGRA) 180 MG tablet Take 180 mg by mouth daily.     Marland Kitchen HYDROcodone-acetaminophen (NORCO/VICODIN) 5-325 MG tablet Take 1 tablet  by mouth every 4 (four) hours as needed (as needed).     Marland Kitchen ibuprofen (ADVIL,MOTRIN) 200 MG tablet Take 400 mg by mouth as needed.     Marland Kitchen oxymetazoline (AFRIN) 0.05 % nasal spray Place 1 spray into both nostrils at bedtime.     . potassium chloride (K-DUR) 10 MEQ tablet Take 10 mEq by mouth daily.     Marland Kitchen triamterene-hydrochlorothiazide (MAXZIDE-25) 37.5-25 MG tablet Take 1 tablet by mouth daily.     Marland Kitchen HYDROcodone-acetaminophen (NORCO) 5-325 MG tablet Take 1-2 tablets by mouth every 4 (four) hours as needed for moderate pain. 12 tablet 0   No current facility-administered medications for this visit.    OBJECTIVE: Filed Vitals:   11/20/14 1048  BP: 120/84  Pulse: 79  Temp: 96.8 F (36 C)  Resp: 18     Body mass index is 30.19 kg/(m^2).    ECOG FS:0 - Asymptomatic  General: Well-developed, well-nourished, no acute distress. Eyes: Pink conjunctiva, anicteric sclera. HEENT: Normocephalic, moist mucous membranes, clear oropharnyx. Breasts: Bilateral breast and axilla without lumps or masses. Lungs: Clear to auscultation bilaterally. Heart: Regular rate and rhythm. No rubs, murmurs, or gallops. Abdomen: Soft, nontender, nondistended. No organomegaly noted, normoactive bowel sounds. Musculoskeletal: No edema, cyanosis, or clubbing. Neuro: Alert, answering all questions appropriately. Cranial nerves grossly intact. Skin: No rashes or petechiae noted. Psych: Normal affect. Lymphatics: No cervical, calvicular, axillary or inguinal LAD.   LAB RESULTS:  Lab Results  Component Value Date   NA 142 05/06/2011   K 3.6 05/06/2011   CL 105 05/06/2011   CO2 26 05/06/2011   GLUCOSE 85 05/06/2011   BUN 19* 05/06/2011   CREATININE 0.96 05/06/2011   CALCIUM 9.3 05/06/2011   GFRNONAA >60 05/06/2011   GFRAA >60 05/06/2011    Lab Results  Component Value Date   WBC 6.9 05/06/2011   HGB 13.3 05/06/2011   HCT 39.5 05/06/2011   MCV 91 05/06/2011   PLT 274 05/06/2011     STUDIES: Nm  Sentinel Node Inj-no Rpt (breast)  11/29/2014  CLINICAL DATA: Left Breast Mass Sulfur colloid was injected intradermally by the nuclear medicine technologist for breast cancer sentinel node localization.   Mm Breast Surgical Specimen  11/29/2014  CLINICAL DATA:  Status post lumpectomy after needle localization. EXAM: SPECIMEN RADIOGRAPH OF THE LEFT BREAST COMPARISON:  Previous exam(s). FINDINGS: Status post excision of the left breast. The wire tip and biopsy marker clip are present and are marked for pathology. Findings discussed with the surgeon during the procedure. IMPRESSION: Specimen radiograph of the left breast. Electronically Signed   By: Franki Cabot M.D.   On: 11/29/2014 12:07   Mm Digital Diagnostic Unilat L  11/08/2014  CLINICAL DATA:  Evaluate clip placement following ultrasound-guided left breast biopsy. EXAM: DIAGNOSTIC LEFT MAMMOGRAM POST ULTRASOUND BIOPSY COMPARISON:  Previous exam(s). FINDINGS: Mammographic images were obtained following ultrasound guided biopsy of 8 mm mass at  the 8 o'clock position of the left breast. The coil shaped clip is in satisfactory position. A small amount of post biopsy hemorrhage is noted. IMPRESSION: Satisfactory clip position following ultrasound-guided left breast biopsy. Small amount of post biopsy hemorrhage. Final Assessment: Post Procedure Mammograms for Marker Placement Electronically Signed   By: Margarette Canada M.D.   On: 11/08/2014 11:34   [place] Lt Breast Bx W Loc Dev 1st Lesion Img Bx Spec US Guide  11/19/2014  ADDENDUM REPORT: 11/15/2014 17:59 ADDENDUM: Pathology of the left breast biopsy revealed INVASIVE MAMMARY CARCINOMA, NO SPECIAL TYPE. PRELIMINARY GRADE: 1 These findings were communicated to Dr. Sabra Heck on 11/13/2014. Read back procedure was performed. (by pathologist). The patient was contacted on 11/09/14 for a post biopsy check by Jetta Lout, [facility]). She stated she had done well following the biopsy with no bleeding,  bruising, or palpable hematoma. The patient was contacted again on 11/15/14. She had been given the results and referred to Dr. Rochel Brome. She is scheduled for surgery with pre-operative needle localization on Thursday, November 29, 2014 (per patient). She also has an appointment pre surgery with an oncologist. Dictation by Jetta Lout, [place] on 11/15/14. Electronically Signed   By: Margarette Canada M.D.   On: 11/15/2014 17:59  11/19/2014  CLINICAL DATA:  64 year old female for tissue sampling of 8 mm mass in the lower inner left breast. EXAM: ULTRASOUND GUIDED LEFT BREAST CORE NEEDLE BIOPSY COMPARISON:  Previous exam(s). PROCEDURE: I met with the patient and we discussed the procedure of ultrasound-guided biopsy, including benefits and alternatives. We discussed the high likelihood of a successful procedure. We discussed the risks of the procedure including infection, bleeding, tissue injury, clip migration, and inadequate sampling. Informed written consent was given. The usual time-out protocol was performed immediately prior to the procedure. Using sterile technique and 2% Lidocaine as local anesthetic, under direct ultrasound visualization, a 12 gauge vacuum-assisted device was used to perform biopsy of the 8 mm hypoechoic mass at the 8 o'clock position of the left breast 8 cm from the nipple using a medial approach. At the conclusion of the procedure, a coil shaped tissue marker clip was deployed into the biopsy cavity. Follow-up 2-view mammogram was performed and dictated separately. IMPRESSION: Ultrasound-guided biopsy of lower inner left breast mass. No apparent complications. Pathology will be followed. Electronically Signed: By: Margarette Canada M.D. On: 11/08/2014 11:36   Mm Lt Plc Breast Loc Dev   1st Lesion  Inc Mammo Guide  11/29/2014  CLINICAL DATA:  Biopsy-proven invasive mammary carcinoma within the left breast. Patient presents today for needle localization for surgical excision. EXAM: NEEDLE  LOCALIZATION OF THE LEFT BREAST WITH MAMMO GUIDANCE COMPARISON:  Previous exams. FINDINGS: Patient presents for needle localization prior to surgical excision. I met with the patient and we discussed the procedure of needle localization including benefits and alternatives. We discussed the high likelihood of a successful procedure. We discussed the risks of the procedure, including infection, bleeding, tissue injury, and further surgery. Informed, written consent was given. The usual time-out protocol was performed immediately prior to the procedure. Using mammographic guidance, sterile technique, 2% lidocaine and a 7 cm modified Kopans needle, the coil shaped biopsy clip was localized using medial approach. The images were marked for surgery. IMPRESSION: Needle localization of the left breast. No apparent complications. Electronically Signed   By: Franki Cabot M.D.   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  PLAN:    1. Breast cancer: Patient completed her lumpectomy on November 29, 2014. Final pathology as above. Oncotype was ordered, but this procedure was denied by insurance. Given the size of patient's tumor, she is likely low risk and we have not recommended adjuvant chemotherapy. Patient will return to clinic in approximately 3-4 weeks to discuss her final pathology as well as consultation with radiation oncology. Once patient completes her XRT, she will benefit from an aromatase inhibitor for 5 years.   Approximately 45 minutes was spent in discussion and consultation.  Patient expressed understanding and was in agreement with this plan. She also understands that She can call clinic at any time with any questions, concerns, or complaints.   Breast cancer, left [facility])   Staging form: Breast, AJCC 7th Edition     Pathologic stage from 12/04/2014: Stage IA (T1b, N0, cM0) - Signed by Lloyd Huger, MD on  12/04/2014   Lloyd Huger, MD   12/04/2014 10:26 PM

## 2014-12-13 ENCOUNTER — Encounter: Payer: Self-pay | Admitting: Radiation Oncology

## 2014-12-13 ENCOUNTER — Ambulatory Visit
Admission: RE | Admit: 2014-12-13 | Discharge: 2014-12-13 | Disposition: A | Discharge status: Home / Self Care | Payer: Commercial Managed Care - PPO | Source: Ambulatory Visit | Attending: Radiation Oncology | Admitting: Radiation Oncology

## 2014-12-13 ENCOUNTER — Inpatient Hospital Stay: Payer: Commercial Managed Care - PPO | Attending: Oncology | Admitting: Oncology

## 2014-12-13 VITALS — BP 105/73 | HR 97 | Temp 99.0°F | Resp 18 | Wt 179.0 lb

## 2014-12-13 DIAGNOSIS — C50512 Malignant neoplasm of lower-outer quadrant of left female breast: Secondary | ICD-10-CM | POA: Insufficient documentation

## 2014-12-13 DIAGNOSIS — C50312 Malignant neoplasm of lower-inner quadrant of left female breast: Secondary | ICD-10-CM | POA: Diagnosis not present

## 2014-12-13 DIAGNOSIS — Z87891 Personal history of nicotine dependence: Secondary | ICD-10-CM | POA: Insufficient documentation

## 2014-12-13 DIAGNOSIS — I499 Cardiac arrhythmia, unspecified: Secondary | ICD-10-CM | POA: Insufficient documentation

## 2014-12-13 DIAGNOSIS — I10 Essential (primary) hypertension: Secondary | ICD-10-CM | POA: Insufficient documentation

## 2014-12-13 DIAGNOSIS — I898 Other specified noninfective disorders of lymphatic vessels and lymph nodes: Secondary | ICD-10-CM | POA: Insufficient documentation

## 2014-12-13 DIAGNOSIS — Z79899 Other long term (current) drug therapy: Secondary | ICD-10-CM | POA: Insufficient documentation

## 2014-12-13 DIAGNOSIS — G20 Parkinson's disease: Secondary | ICD-10-CM | POA: Diagnosis not present

## 2014-12-13 DIAGNOSIS — C50912 Malignant neoplasm of unspecified site of left female breast: Secondary | ICD-10-CM

## 2014-12-13 DIAGNOSIS — M1990 Unspecified osteoarthritis, unspecified site: Secondary | ICD-10-CM | POA: Insufficient documentation

## 2014-12-13 DIAGNOSIS — F17210 Nicotine dependence, cigarettes, uncomplicated: Secondary | ICD-10-CM | POA: Insufficient documentation

## 2014-12-13 DIAGNOSIS — Z510 Encounter for antineoplastic radiation therapy: Secondary | ICD-10-CM | POA: Insufficient documentation

## 2014-12-13 DIAGNOSIS — Z803 Family history of malignant neoplasm of breast: Secondary | ICD-10-CM | POA: Insufficient documentation

## 2014-12-13 DIAGNOSIS — Z170 Estrogen receptor positive status [ER+]: Secondary | ICD-10-CM | POA: Insufficient documentation

## 2014-12-13 NOTE — Consult Note (Signed)
Except an outstanding is perfect of Radiation Oncology NEW PATIENT EVALUATION  Name: Kathy Bean  MRN: [id number]  Date:   12/13/2014     DOB: Jul 18, 1950   This 64 y.o. female patient presents to the clinic for initial evaluation of breast cancer stage IA (T1 BN 0 M0) invasive mammary carcinoma status post wide local excision and sentinel node biopsy tumor is ER/PR positive HER-2/neu not overexpressed.  REFERRING PHYSICIAN: Rusty Aus, MD  CHIEF COMPLAINT:  Chief Complaint  Patient presents with  . Breast Cancer    Pt is here for initial consultation of breast cancer.      DIAGNOSIS: The encounter diagnosis was Malignant neoplasm of left female breast, unspecified site of breast ([place]).   PREVIOUS INVESTIGATIONS:  Pathology reports reviewed Mammograms and ultrasound reviewed Clinical notes reviewed  HPI: Patient is a 64 year old female who presented with an 8 mm lesion at the 8:00 position in the left breast new from prior examination suspicious for malignancy. Ultrasound-guided biopsy was performed showing invasive mammary carcinoma ER/PR positive HER-2/neu not overexpressed. She went on to have a wide local excision showing 6 no meters of residual disease as well as 2 sentinel lymph nodes negative for metastatic disease. Margins were clear but close at 1.5 mm. No lymphovascular invasion was noted. She's been seen by medical oncology on Oncotype DX was not authorized by her insurance carrier. Medical oncology declined systemic chemotherapy. 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 She otherwise specifically denies breast tenderness cough or bone pain. She is seen today for consideration of radiation therapy.  PLANNED TREATMENT REGIMEN: Whole breast radiation  PAST MEDICAL HISTORY:  has a past medical history of Osteoarthritis; Depression; Parkinson disease ([place]); Family history of adverse reaction to anesthesia; Hypertension;  Dysrhythmia; Headache; Cancer ([place]) (2016); and Breast cancer ([place]) (11/2014).    PAST SURGICAL HISTORY:  Past Surgical History  Procedure Laterality Date  . Dilation and curettage of uterus    . Laminectomy posterior lumbar facetectomy & foraminotomy w/decomp      BILATERAL LUMBAR DECOMPRESSION L2-3, L3-4, L4-5; Surgeon: Loni Dolly, MD; Location: [place]; Service: Orthopedics; Laterality: Bilateral;  . Laminectomy posterior cervicle decomp w/facetectomy & foraminotomy      [id number] x 3...Marland KitchenMarland KitchenEXCISION CENTRAL LEFT [place] HERNIATION L 2-3; Surgeon: Loni Dolly, MD; Location: [place]; Service: Orthopedics; Laterality: Left  . Breast biopsy Left 10/2014    positive  . Partial mastectomy with needle localization Left 11/29/2014    Procedure: PARTIAL MASTECTOMY WITH NEEDLE LOCALIZATION;  Surgeon: Leonie Green, MD;  Location: [facility];  Service: General;  Laterality: Left;  . Sentinel node biopsy Left 11/29/2014    Procedure: SENTINEL NODE BIOPSY;  Surgeon: Leonie Green, MD;  Location: [facility];  Service: General;  Laterality: Left;    FAMILY HISTORY: family history includes Breast cancer in her maternal aunt and paternal aunt; Coronary artery disease in her mother and paternal grandmother; Heart attack in her mother.  SOCIAL HISTORY:  reports that she quit smoking about 15 years ago. She does not have any smokeless tobacco history on file. She reports that she drinks about 8.4 oz of alcohol per week. She reports that she does not use illicit drugs.  ALLERGIES: Review of patient's allergies indicates no known allergies.  MEDICATIONS:  Current Outpatient Prescriptions  Medication Sig Dispense Refill  . carbidopa-levodopa (SINEMET IR) 10-100 MG tablet Take 1 tablet by mouth 3 (three) times daily.     Marland Kitchen  citalopram (CELEXA) 20 MG tablet TAKE ONE TABLET BY MOUTH DAILY. (DEPRESSION) *NO CHILDPROOF CAPS!* take evening    . Cyanocobalamin (RA VITAMIN B-12 TR) 1000 MCG TBCR Take  1,000 mcg by mouth every morning.     Marland Kitchen dextromethorphan (DELSYM) 30 MG/5ML liquid Take by mouth at bedtime as needed.     . diphenhydrAMINE (BENADRYL) 25 mg capsule Take 50 mg by mouth at bedtime.     . fexofenadine (ALLEGRA) 180 MG tablet Take 180 mg by mouth daily.     Marland Kitchen HYDROcodone-acetaminophen (NORCO) 5-325 MG tablet Take 1-2 tablets by mouth every 4 (four) hours as needed for moderate pain. 12 tablet 0  . ibuprofen (ADVIL,MOTRIN) 200 MG tablet Take 400 mg by mouth as needed.     Marland Kitchen oxymetazoline (AFRIN) 0.05 % nasal spray Place 1 spray into both nostrils at bedtime.     . potassium chloride (K-DUR) 10 MEQ tablet Take 10 mEq by mouth daily.     Marland Kitchen triamterene-hydrochlorothiazide (MAXZIDE-25) 37.5-25 MG tablet Take 1 tablet by mouth daily.      No current facility-administered medications for this encounter.    ECOG PERFORMANCE STATUS:  0 - Asymptomatic  REVIEW OF SYSTEMS:  Patient denies any weight loss, fatigue, weakness, fever, chills or night sweats. Patient denies any loss of vision, blurred vision. Patient denies any ringing  of the ears or hearing loss. No irregular heartbeat. Patient denies heart murmur or history of fainting. Patient denies any chest pain or pain radiating to her upper extremities. Patient denies any shortness of breath, difficulty breathing at night, cough or hemoptysis. Patient denies any swelling in the lower legs. Patient denies any nausea vomiting, vomiting of blood, or coffee ground material in the vomitus. Patient denies any stomach pain. Patient states has had normal bowel movements no significant constipation or diarrhea. Patient denies any dysuria, hematuria or significant nocturia. Patient denies any problems walking, swelling in the joints or loss of balance. Patient denies any skin changes, loss of hair or loss of weight. Patient denies any excessive worrying or anxiety or significant depression. Patient denies any problems with insomnia. Patient denies  excessive thirst, polyuria, polydipsia. Patient denies any swollen glands, patient denies easy bruising or easy bleeding. Patient denies any recent infections, allergies or URI. Patient "s visual fields have not changed significantly in recent time.    PHYSICAL EXAM: There were no vitals taken for this visit. Well-developed female in NAD. She status was wide local excision of the left breast with incision healing well. She is a small lymphocele about 2 cm in the left axilla. No other dominant mass or nodularity is noted in either breast in 2 positions examined. Well-developed well-nourished patient in NAD. HEENT reveals PERLA, EOMI, discs not visualized.  Oral cavity is clear. No oral mucosal lesions are identified. Neck is clear without evidence of cervical or supraclavicular adenopathy. Lungs are clear to A&P. Cardiac examination is essentially unremarkable with regular rate and rhythm without murmur rub or thrill. Abdomen is benign with no organomegaly or masses noted. Motor sensory and DTR levels are equal and symmetric in the upper and lower extremities. Cranial nerves II through XII are grossly intact. Proprioception is intact. No peripheral adenopathy or edema is identified. No motor or sensory levels are noted. Crude visual fields are within normal range.  LABORATORY DATA: Pathology reports reviewed    RADIOLOGY RESULTS: Mammograms and ultrasound are reviewed   IMPRESSION: Stage I a invasive mammary carcinoma the left breast as was wide local  excision and sentinel node biopsy ER/PR positive HER-2/neu negative in 64 year old female for adjuvant whole breast radiation  PLAN: At this time I to go ahead with whole breast radiation to 5000 cGy over 5 weeks. She is rather large breasted which would make hypofractionated regiment problematic. I would also boost her scar another 1600 cGy using electrons based on the close but clear margin. Risks and benefits of treatment including skin reaction  fatigue possible inclusion of superficial lung alteration of blood counts all were discussed in detail with the patient. I have told her we can observe her lymphocele at this time should be a problem we can always have surgeon drained that down the road. Patient also would be a candidate for aromatase inhibitor therapy after completion of radiation. I have set up and ordered CT simulation on her for next week.  I would like to take this opportunity for allowing me to participate in the care of your patient.Armstead Peaks., MD

## 2014-12-19 ENCOUNTER — Ambulatory Visit: Payer: Commercial Managed Care - PPO

## 2014-12-19 DIAGNOSIS — Z79899 Other long term (current) drug therapy: Secondary | ICD-10-CM | POA: Diagnosis not present

## 2014-12-19 DIAGNOSIS — I499 Cardiac arrhythmia, unspecified: Secondary | ICD-10-CM | POA: Diagnosis not present

## 2014-12-19 DIAGNOSIS — C50512 Malignant neoplasm of lower-outer quadrant of left female breast: Secondary | ICD-10-CM | POA: Diagnosis present

## 2014-12-19 DIAGNOSIS — I10 Essential (primary) hypertension: Secondary | ICD-10-CM | POA: Diagnosis not present

## 2014-12-19 DIAGNOSIS — F17210 Nicotine dependence, cigarettes, uncomplicated: Secondary | ICD-10-CM | POA: Diagnosis not present

## 2014-12-19 DIAGNOSIS — G20 Parkinson's disease: Secondary | ICD-10-CM | POA: Diagnosis not present

## 2014-12-19 DIAGNOSIS — Z803 Family history of malignant neoplasm of breast: Secondary | ICD-10-CM | POA: Diagnosis not present

## 2014-12-19 DIAGNOSIS — I898 Other specified noninfective disorders of lymphatic vessels and lymph nodes: Secondary | ICD-10-CM | POA: Diagnosis not present

## 2014-12-19 DIAGNOSIS — Z510 Encounter for antineoplastic radiation therapy: Secondary | ICD-10-CM | POA: Diagnosis not present

## 2014-12-19 DIAGNOSIS — Z170 Estrogen receptor positive status [ER+]: Secondary | ICD-10-CM | POA: Diagnosis not present

## 2014-12-21 NOTE — Progress Notes (Signed)
[place]  [phone] [phone]  ID: Kathy Bean OB: Aug 25, 1950  MR#: [id number]  MGN#:[id number]  Patient Care Team: Rusty Aus, MD as PCP - General (Internal Medicine)  CHIEF COMPLAINT:  Chief Complaint  Patient presents with  . Breast Cancer    INTERVAL HISTORY: Patient returns to clinic today for further evaluation,, discussion of her pathology results, and treatment planning. Tolerated her surgery well without significant side effects. She currently feels well and is asymptomatic. She has no neurologic complaints. She denies any recent fevers or illnesses. She denies any pain. She has good appetite and denies weight loss. She has no chest pain or shortness of breath. She has no nausea, vomiting, constipation, or diarrhea. She has no urinary complaints. Patient offers no specific complaints today.  REVIEW OF SYSTEMS:   Review of Systems  Constitutional: Negative.   Respiratory: Negative.   Cardiovascular: Negative.   Gastrointestinal: Negative.   Musculoskeletal: Negative.   Neurological: Negative.   Psychiatric/Behavioral: The patient is nervous/anxious.     As per HPI. Otherwise, a complete review of systems is negatve.  PAST MEDICAL HISTORY: Past Medical History  Diagnosis Date  . Osteoarthritis   . Depression   . Parkinson disease ([place])   . Family history of adverse reaction to anesthesia     brother had difficulty going under anesthseia  . Hypertension   . Dysrhythmia     heart racing  . Headache     hx of migraines   period induced  . Cancer [facility]) 2016    left breast  . Breast cancer ([place]) 11/2014    left    PAST SURGICAL HISTORY: Past Surgical History  Procedure Laterality Date  . Dilation and curettage of uterus    . Laminectomy posterior lumbar facetectomy & foraminotomy w/decomp      BILATERAL LUMBAR DECOMPRESSION L2-3, L3-4, L4-5; Surgeon: Loni Dolly, MD; Location: [facility]; Service:  Orthopedics; Laterality: Bilateral;  . Laminectomy posterior cervicle decomp w/facetectomy & foraminotomy      [id number] x 3...Marland KitchenMarland KitchenEXCISION CENTRAL LEFT [place] HERNIATION L 2-3; Surgeon: Loni Dolly, MD; Location: [place]; Service: Orthopedics; Laterality: Left  . Breast biopsy Left 10/2014    positive  . Partial mastectomy with needle localization Left 11/29/2014    Procedure: PARTIAL MASTECTOMY WITH NEEDLE LOCALIZATION;  Surgeon: Leonie Green, MD;  Location: [facility];  Service: General;  Laterality: Left;  . Sentinel node biopsy Left 11/29/2014    Procedure: SENTINEL NODE BIOPSY;  Surgeon: Leonie Green, MD;  Location: [facility];  Service: General;  Laterality: Left;    FAMILY HISTORY Family History  Problem Relation Age of Onset  . Breast cancer Maternal Aunt   . Breast cancer Paternal Aunt   . Coronary artery disease Mother   . Heart attack Mother   . Coronary artery disease Paternal Grandmother        ADVANCED DIRECTIVES:    HEALTH MAINTENANCE: Social History  Substance Use Topics  . Smoking status: Former Smoker    Quit date: 11/22/1999  . Smokeless tobacco: Not on file  . Alcohol Use: 8.4 oz/week    14 Glasses of wine per week     Colonoscopy:  PAP:  Bone density:  Lipid panel:  No Known Allergies  Current Outpatient Prescriptions  Medication Sig Dispense Refill  . carbidopa-levodopa (SINEMET IR) 10-100 MG tablet Take 1 tablet by mouth 3 (three) times daily.     . citalopram (CELEXA) 20 MG tablet TAKE  ONE TABLET BY MOUTH DAILY. (DEPRESSION) *NO CHILDPROOF CAPS!* take evening    . Cyanocobalamin (RA VITAMIN B-12 TR) 1000 MCG TBCR Take 1,000 mcg by mouth every morning.     Marland Kitchen dextromethorphan (DELSYM) 30 MG/5ML liquid Take by mouth at bedtime as needed.     . diphenhydrAMINE (BENADRYL) 25 mg capsule Take 50 mg by mouth at bedtime.     . fexofenadine (ALLEGRA) 180 MG tablet Take 180 mg by mouth daily.     Marland Kitchen HYDROcodone-acetaminophen (NORCO) 5-325 MG  tablet Take 1-2 tablets by mouth every 4 (four) hours as needed for moderate pain. 12 tablet 0  . ibuprofen (ADVIL,MOTRIN) 200 MG tablet Take 400 mg by mouth as needed.     Marland Kitchen oxymetazoline (AFRIN) 0.05 % nasal spray Place 1 spray into both nostrils at bedtime.     . potassium chloride (K-DUR) 10 MEQ tablet Take 10 mEq by mouth daily.     Marland Kitchen triamterene-hydrochlorothiazide (MAXZIDE-25) 37.5-25 MG tablet Take 1 tablet by mouth daily.      No current facility-administered medications for this visit.    OBJECTIVE: Filed Vitals:   12/13/14 1051  BP: 105/73  Pulse: 97  Temp: 99 F (37.2 C)  Resp: 18     Body mass index is 27.61 kg/(m^2).    ECOG FS:0 - Asymptomatic  General: Well-developed, well-nourished, no acute distress. Eyes: Pink conjunctiva, anicteric sclera. Breasts: Bilateral breast and axilla without lumps or masses. Exam deferred today.  Lungs: Clear to auscultation bilaterally. Heart: Regular rate and rhythm. No rubs, murmurs, or gallops. Abdomen: Soft, nontender, nondistended. No organomegaly noted, normoactive bowel sounds. Musculoskeletal: No edema, cyanosis, or clubbing. Neuro: Alert, answering all questions appropriately. Cranial nerves grossly intact. Skin: No rashes or petechiae noted. Psych: Normal affect.   LAB RESULTS:  Lab Results  Component Value Date   NA 142 05/06/2011   K 3.6 05/06/2011   CL 105 05/06/2011   CO2 26 05/06/2011   GLUCOSE 85 05/06/2011   BUN 19* 05/06/2011   CREATININE 0.96 05/06/2011   CALCIUM 9.3 05/06/2011   GFRNONAA >60 05/06/2011   GFRAA >60 05/06/2011    Lab Results  Component Value Date   WBC 6.9 05/06/2011   HGB 13.3 05/06/2011   HCT 39.5 05/06/2011   MCV 91 05/06/2011   PLT 274 05/06/2011     STUDIES: Nm Sentinel Node Inj-no Rpt (breast)  11/29/2014  CLINICAL DATA: Left Breast Mass Sulfur colloid was injected intradermally by the nuclear medicine technologist for breast cancer sentinel node localization.   Mm  Breast Surgical Specimen  11/29/2014  CLINICAL DATA:  Status post lumpectomy after needle localization. EXAM: SPECIMEN RADIOGRAPH OF THE LEFT BREAST COMPARISON:  Previous exam(s). FINDINGS: Status post excision of the left breast. The wire tip and biopsy marker clip are present and are marked for pathology. Findings discussed with the surgeon during the procedure. IMPRESSION: Specimen radiograph of the left breast. Electronically Signed   By: Franki Cabot M.D.   On: 11/29/2014 12:07   Mm Lt Plc Breast Loc Dev   1st Lesion  Inc Mammo Guide  11/29/2014  CLINICAL DATA:  Biopsy-proven invasive mammary carcinoma within the left breast. Patient presents today for needle localization for surgical excision. EXAM: NEEDLE LOCALIZATION OF THE LEFT BREAST WITH MAMMO GUIDANCE COMPARISON:  Previous exams. FINDINGS: Patient presents for needle localization prior to surgical excision. I met with the patient and we discussed the procedure of needle localization including benefits and alternatives. We discussed the high likelihood of  a successful procedure. We discussed the risks of the procedure, including infection, bleeding, tissue injury, and further surgery. Informed, written consent was given. The usual time-out protocol was performed immediately prior to the procedure. Using mammographic guidance, sterile technique, 2% lidocaine and a 7 cm modified Kopans needle, the coil shaped biopsy clip was localized using medial approach. The images were marked for surgery. IMPRESSION: Needle localization of the left breast. No apparent complications. Electronically Signed   By: Franki Cabot M.D.   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  PLAN:    1. Breast cancer: Patient completed her lumpectomy on November 29, 2014. Final pathology as above. Oncotype was ordered, but this procedure was denied by insurance. Given the size of patient's tumor,  she is likely low risk and we have not recommended adjuvant chemotherapy.  Patient has an appointment with radiation oncology today and will proceed with adjuvant XRT. Return to clinic at the end of her radiation treatments to initiate an aromatase inhibitor for 5 years.    Patient expressed understanding and was in agreement with this plan. She also understands that She can call clinic at any time with any questions, concerns, or complaints.   Breast cancer, left [facility])   Staging form: Breast, AJCC 7th Edition     Pathologic stage from 12/04/2014: Stage IA (T1b, N0, cM0) - Signed by Lloyd Huger, MD on 12/04/2014   Lloyd Huger, MD   12/21/2014 1:11 PM

## 2014-12-24 ENCOUNTER — Other Ambulatory Visit: Payer: Self-pay | Admitting: *Deleted

## 2014-12-24 DIAGNOSIS — C50512 Malignant neoplasm of lower-outer quadrant of left female breast: Secondary | ICD-10-CM | POA: Diagnosis not present

## 2014-12-26 DIAGNOSIS — C50512 Malignant neoplasm of lower-outer quadrant of left female breast: Secondary | ICD-10-CM | POA: Diagnosis not present

## 2014-12-27 DIAGNOSIS — C50512 Malignant neoplasm of lower-outer quadrant of left female breast: Secondary | ICD-10-CM | POA: Diagnosis not present

## 2014-12-28 DIAGNOSIS — C50512 Malignant neoplasm of lower-outer quadrant of left female breast: Secondary | ICD-10-CM | POA: Diagnosis not present

## 2014-12-31 ENCOUNTER — Ambulatory Visit
Admission: RE | Admit: 2014-12-31 | Discharge: 2014-12-31 | Disposition: A | Discharge status: Home / Self Care | Payer: Commercial Managed Care - PPO | Source: Ambulatory Visit | Attending: Radiation Oncology | Admitting: Radiation Oncology

## 2014-12-31 DIAGNOSIS — C50512 Malignant neoplasm of lower-outer quadrant of left female breast: Secondary | ICD-10-CM | POA: Diagnosis not present

## 2015-01-01 ENCOUNTER — Ambulatory Visit
Admission: RE | Admit: 2015-01-01 | Discharge: 2015-01-01 | Disposition: A | Discharge status: Home / Self Care | Payer: Commercial Managed Care - PPO | Source: Ambulatory Visit | Attending: Radiation Oncology | Admitting: Radiation Oncology

## 2015-01-01 DIAGNOSIS — C50512 Malignant neoplasm of lower-outer quadrant of left female breast: Secondary | ICD-10-CM | POA: Diagnosis not present

## 2015-01-02 ENCOUNTER — Ambulatory Visit
Admission: RE | Admit: 2015-01-02 | Discharge: 2015-01-02 | Disposition: A | Discharge status: Home / Self Care | Payer: Commercial Managed Care - PPO | Source: Ambulatory Visit | Attending: Radiation Oncology | Admitting: Radiation Oncology

## 2015-01-02 DIAGNOSIS — C50512 Malignant neoplasm of lower-outer quadrant of left female breast: Secondary | ICD-10-CM | POA: Diagnosis not present

## 2015-01-07 ENCOUNTER — Ambulatory Visit
Admission: RE | Admit: 2015-01-07 | Discharge: 2015-01-07 | Disposition: A | Discharge status: Home / Self Care | Payer: Commercial Managed Care - PPO | Source: Ambulatory Visit | Attending: Radiation Oncology | Admitting: Radiation Oncology

## 2015-01-07 DIAGNOSIS — C50512 Malignant neoplasm of lower-outer quadrant of left female breast: Secondary | ICD-10-CM | POA: Diagnosis not present

## 2015-01-08 ENCOUNTER — Ambulatory Visit
Admission: RE | Admit: 2015-01-08 | Discharge: 2015-01-08 | Disposition: A | Discharge status: Home / Self Care | Payer: Commercial Managed Care - PPO | Source: Ambulatory Visit | Attending: Radiation Oncology | Admitting: Radiation Oncology

## 2015-01-08 DIAGNOSIS — C50512 Malignant neoplasm of lower-outer quadrant of left female breast: Secondary | ICD-10-CM | POA: Diagnosis not present

## 2015-01-09 ENCOUNTER — Ambulatory Visit
Admission: RE | Admit: 2015-01-09 | Discharge: 2015-01-09 | Disposition: A | Discharge status: Home / Self Care | Payer: Commercial Managed Care - PPO | Source: Ambulatory Visit | Attending: Radiation Oncology | Admitting: Radiation Oncology

## 2015-01-09 DIAGNOSIS — C50512 Malignant neoplasm of lower-outer quadrant of left female breast: Secondary | ICD-10-CM | POA: Diagnosis not present

## 2015-01-10 ENCOUNTER — Ambulatory Visit
Admission: RE | Admit: 2015-01-10 | Discharge: 2015-01-10 | Disposition: A | Discharge status: Home / Self Care | Payer: Commercial Managed Care - PPO | Source: Ambulatory Visit | Attending: Radiation Oncology | Admitting: Radiation Oncology

## 2015-01-10 ENCOUNTER — Inpatient Hospital Stay: Payer: Commercial Managed Care - PPO | Attending: Oncology

## 2015-01-10 DIAGNOSIS — C50512 Malignant neoplasm of lower-outer quadrant of left female breast: Secondary | ICD-10-CM | POA: Diagnosis not present

## 2015-01-10 DIAGNOSIS — C50312 Malignant neoplasm of lower-inner quadrant of left female breast: Secondary | ICD-10-CM | POA: Insufficient documentation

## 2015-01-11 ENCOUNTER — Ambulatory Visit
Admission: RE | Admit: 2015-01-11 | Discharge: 2015-01-11 | Disposition: A | Discharge status: Home / Self Care | Payer: Commercial Managed Care - PPO | Source: Ambulatory Visit | Attending: Radiation Oncology | Admitting: Radiation Oncology

## 2015-01-11 ENCOUNTER — Inpatient Hospital Stay: Payer: Commercial Managed Care - PPO

## 2015-01-11 DIAGNOSIS — C50512 Malignant neoplasm of lower-outer quadrant of left female breast: Secondary | ICD-10-CM | POA: Diagnosis not present

## 2015-01-11 DIAGNOSIS — C50312 Malignant neoplasm of lower-inner quadrant of left female breast: Secondary | ICD-10-CM | POA: Diagnosis present

## 2015-01-11 LAB — CBC
HEMATOCRIT: 44.3 % (ref 35.0–47.0)
HEMOGLOBIN: 14.9 g/dL (ref 12.0–16.0)
MCH: 31 pg (ref 26.0–34.0)
MCHC: 33.6 g/dL (ref 32.0–36.0)
MCV: 92.2 fL (ref 80.0–100.0)
Platelets: 211 10*3/uL (ref 150–440)
RBC: 4.8 MIL/uL (ref 3.80–5.20)
RDW: 13.1 % (ref 11.5–14.5)
WBC: 4.8 10*3/uL (ref 3.6–11.0)

## 2015-01-14 ENCOUNTER — Ambulatory Visit
Admission: RE | Admit: 2015-01-14 | Discharge: 2015-01-14 | Disposition: A | Discharge status: Home / Self Care | Payer: Commercial Managed Care - PPO | Source: Ambulatory Visit | Attending: Radiation Oncology | Admitting: Radiation Oncology

## 2015-01-14 DIAGNOSIS — C50512 Malignant neoplasm of lower-outer quadrant of left female breast: Secondary | ICD-10-CM | POA: Diagnosis not present

## 2015-01-15 ENCOUNTER — Ambulatory Visit
Admission: RE | Admit: 2015-01-15 | Discharge: 2015-01-15 | Disposition: A | Discharge status: Home / Self Care | Payer: Commercial Managed Care - PPO | Source: Ambulatory Visit | Attending: Radiation Oncology | Admitting: Radiation Oncology

## 2015-01-15 DIAGNOSIS — C50512 Malignant neoplasm of lower-outer quadrant of left female breast: Secondary | ICD-10-CM | POA: Diagnosis not present

## 2015-01-16 ENCOUNTER — Ambulatory Visit
Admission: RE | Admit: 2015-01-16 | Discharge: 2015-01-16 | Disposition: A | Discharge status: Home / Self Care | Payer: Commercial Managed Care - PPO | Source: Ambulatory Visit | Attending: Radiation Oncology | Admitting: Radiation Oncology

## 2015-01-16 DIAGNOSIS — C50512 Malignant neoplasm of lower-outer quadrant of left female breast: Secondary | ICD-10-CM | POA: Diagnosis not present

## 2015-01-17 ENCOUNTER — Ambulatory Visit
Admission: RE | Admit: 2015-01-17 | Discharge: 2015-01-17 | Disposition: A | Discharge status: Home / Self Care | Payer: Commercial Managed Care - PPO | Source: Ambulatory Visit | Attending: Radiation Oncology | Admitting: Radiation Oncology

## 2015-01-17 DIAGNOSIS — C50512 Malignant neoplasm of lower-outer quadrant of left female breast: Secondary | ICD-10-CM | POA: Diagnosis not present

## 2015-01-18 ENCOUNTER — Ambulatory Visit
Admission: RE | Admit: 2015-01-18 | Discharge: 2015-01-18 | Disposition: A | Discharge status: Home / Self Care | Payer: Commercial Managed Care - PPO | Source: Ambulatory Visit | Attending: Radiation Oncology | Admitting: Radiation Oncology

## 2015-01-18 DIAGNOSIS — C50512 Malignant neoplasm of lower-outer quadrant of left female breast: Secondary | ICD-10-CM | POA: Diagnosis not present

## 2015-01-21 ENCOUNTER — Ambulatory Visit
Admission: RE | Admit: 2015-01-21 | Discharge: 2015-01-21 | Disposition: A | Discharge status: Home / Self Care | Payer: Commercial Managed Care - PPO | Source: Ambulatory Visit | Attending: Radiation Oncology | Admitting: Radiation Oncology

## 2015-01-21 DIAGNOSIS — C50512 Malignant neoplasm of lower-outer quadrant of left female breast: Secondary | ICD-10-CM | POA: Diagnosis not present

## 2015-01-22 ENCOUNTER — Ambulatory Visit
Admission: RE | Admit: 2015-01-22 | Discharge: 2015-01-22 | Disposition: A | Discharge status: Home / Self Care | Payer: Commercial Managed Care - PPO | Source: Ambulatory Visit | Attending: Radiation Oncology | Admitting: Radiation Oncology

## 2015-01-22 DIAGNOSIS — C50512 Malignant neoplasm of lower-outer quadrant of left female breast: Secondary | ICD-10-CM | POA: Diagnosis not present

## 2015-01-23 ENCOUNTER — Ambulatory Visit
Admission: RE | Admit: 2015-01-23 | Discharge: 2015-01-23 | Disposition: A | Discharge status: Home / Self Care | Payer: Commercial Managed Care - PPO | Source: Ambulatory Visit | Attending: Radiation Oncology | Admitting: Radiation Oncology

## 2015-01-23 DIAGNOSIS — C50512 Malignant neoplasm of lower-outer quadrant of left female breast: Secondary | ICD-10-CM | POA: Diagnosis not present

## 2015-01-24 ENCOUNTER — Ambulatory Visit
Admission: RE | Admit: 2015-01-24 | Discharge: 2015-01-24 | Disposition: A | Discharge status: Home / Self Care | Payer: Commercial Managed Care - PPO | Source: Ambulatory Visit | Attending: Radiation Oncology | Admitting: Radiation Oncology

## 2015-01-24 ENCOUNTER — Inpatient Hospital Stay: Payer: Commercial Managed Care - PPO

## 2015-01-24 DIAGNOSIS — C50512 Malignant neoplasm of lower-outer quadrant of left female breast: Secondary | ICD-10-CM | POA: Diagnosis not present

## 2015-01-25 ENCOUNTER — Ambulatory Visit
Admission: RE | Admit: 2015-01-25 | Discharge: 2015-01-25 | Disposition: A | Discharge status: Home / Self Care | Payer: Commercial Managed Care - PPO | Source: Ambulatory Visit | Attending: Radiation Oncology | Admitting: Radiation Oncology

## 2015-01-25 DIAGNOSIS — C50512 Malignant neoplasm of lower-outer quadrant of left female breast: Secondary | ICD-10-CM | POA: Diagnosis not present

## 2015-01-28 ENCOUNTER — Ambulatory Visit
Admission: RE | Admit: 2015-01-28 | Discharge: 2015-01-28 | Disposition: A | Discharge status: Home / Self Care | Payer: Commercial Managed Care - PPO | Source: Ambulatory Visit | Attending: Radiation Oncology | Admitting: Radiation Oncology

## 2015-01-28 DIAGNOSIS — C50512 Malignant neoplasm of lower-outer quadrant of left female breast: Secondary | ICD-10-CM | POA: Diagnosis not present

## 2015-01-29 ENCOUNTER — Ambulatory Visit
Admission: RE | Admit: 2015-01-29 | Discharge: 2015-01-29 | Disposition: A | Discharge status: Home / Self Care | Payer: Commercial Managed Care - PPO | Source: Ambulatory Visit | Attending: Radiation Oncology | Admitting: Radiation Oncology

## 2015-01-29 ENCOUNTER — Ambulatory Visit: Payer: Commercial Managed Care - PPO

## 2015-01-29 DIAGNOSIS — C50512 Malignant neoplasm of lower-outer quadrant of left female breast: Secondary | ICD-10-CM | POA: Diagnosis not present

## 2015-01-30 ENCOUNTER — Ambulatory Visit
Admission: RE | Admit: 2015-01-30 | Discharge: 2015-01-30 | Disposition: A | Discharge status: Home / Self Care | Payer: Commercial Managed Care - PPO | Source: Ambulatory Visit | Attending: Radiation Oncology | Admitting: Radiation Oncology

## 2015-01-30 DIAGNOSIS — C50512 Malignant neoplasm of lower-outer quadrant of left female breast: Secondary | ICD-10-CM | POA: Diagnosis not present

## 2015-01-31 ENCOUNTER — Ambulatory Visit
Admission: RE | Admit: 2015-01-31 | Discharge: 2015-01-31 | Disposition: A | Discharge status: Home / Self Care | Payer: Commercial Managed Care - PPO | Source: Ambulatory Visit | Attending: Radiation Oncology | Admitting: Radiation Oncology

## 2015-01-31 DIAGNOSIS — C50512 Malignant neoplasm of lower-outer quadrant of left female breast: Secondary | ICD-10-CM | POA: Diagnosis not present

## 2015-02-01 ENCOUNTER — Ambulatory Visit
Admission: RE | Admit: 2015-02-01 | Discharge: 2015-02-01 | Disposition: A | Discharge status: Home / Self Care | Payer: Commercial Managed Care - PPO | Source: Ambulatory Visit | Attending: Radiation Oncology | Admitting: Radiation Oncology

## 2015-02-01 DIAGNOSIS — C50512 Malignant neoplasm of lower-outer quadrant of left female breast: Secondary | ICD-10-CM | POA: Diagnosis not present

## 2015-02-05 ENCOUNTER — Ambulatory Visit
Admission: RE | Admit: 2015-02-05 | Discharge: 2015-02-05 | Disposition: A | Discharge status: Home / Self Care | Payer: Commercial Managed Care - PPO | Source: Ambulatory Visit | Attending: Radiation Oncology | Admitting: Radiation Oncology

## 2015-02-05 DIAGNOSIS — C50512 Malignant neoplasm of lower-outer quadrant of left female breast: Secondary | ICD-10-CM | POA: Diagnosis not present

## 2015-02-06 ENCOUNTER — Ambulatory Visit
Admission: RE | Admit: 2015-02-06 | Discharge: 2015-02-06 | Disposition: A | Discharge status: Home / Self Care | Payer: Commercial Managed Care - PPO | Source: Ambulatory Visit | Attending: Radiation Oncology | Admitting: Radiation Oncology

## 2015-02-06 DIAGNOSIS — C50512 Malignant neoplasm of lower-outer quadrant of left female breast: Secondary | ICD-10-CM | POA: Diagnosis not present

## 2015-02-07 ENCOUNTER — Inpatient Hospital Stay: Payer: Commercial Managed Care - PPO

## 2015-02-07 ENCOUNTER — Ambulatory Visit
Admission: RE | Admit: 2015-02-07 | Discharge: 2015-02-07 | Disposition: A | Discharge status: Home / Self Care | Payer: Commercial Managed Care - PPO | Source: Ambulatory Visit | Attending: Radiation Oncology | Admitting: Radiation Oncology

## 2015-02-07 DIAGNOSIS — C50512 Malignant neoplasm of lower-outer quadrant of left female breast: Secondary | ICD-10-CM | POA: Diagnosis not present

## 2015-02-08 ENCOUNTER — Ambulatory Visit
Admission: RE | Admit: 2015-02-08 | Discharge: 2015-02-08 | Disposition: A | Discharge status: Home / Self Care | Payer: Commercial Managed Care - PPO | Source: Ambulatory Visit | Attending: Radiation Oncology | Admitting: Radiation Oncology

## 2015-02-08 DIAGNOSIS — C50512 Malignant neoplasm of lower-outer quadrant of left female breast: Secondary | ICD-10-CM | POA: Diagnosis not present

## 2015-02-12 ENCOUNTER — Ambulatory Visit
Admission: RE | Admit: 2015-02-12 | Discharge: 2015-02-12 | Disposition: A | Discharge status: Home / Self Care | Payer: Commercial Managed Care - PPO | Source: Ambulatory Visit | Attending: Radiation Oncology | Admitting: Radiation Oncology

## 2015-02-12 DIAGNOSIS — C50512 Malignant neoplasm of lower-outer quadrant of left female breast: Secondary | ICD-10-CM | POA: Diagnosis not present

## 2015-02-13 ENCOUNTER — Ambulatory Visit
Admission: RE | Admit: 2015-02-13 | Discharge: 2015-02-13 | Disposition: A | Discharge status: Home / Self Care | Payer: Commercial Managed Care - PPO | Source: Ambulatory Visit | Attending: Radiation Oncology | Admitting: Radiation Oncology

## 2015-02-13 DIAGNOSIS — C50512 Malignant neoplasm of lower-outer quadrant of left female breast: Secondary | ICD-10-CM | POA: Diagnosis not present

## 2015-02-14 ENCOUNTER — Ambulatory Visit
Admission: RE | Admit: 2015-02-14 | Discharge: 2015-02-14 | Disposition: A | Discharge status: Home / Self Care | Payer: Commercial Managed Care - PPO | Source: Ambulatory Visit | Attending: Radiation Oncology | Admitting: Radiation Oncology

## 2015-02-14 DIAGNOSIS — C50512 Malignant neoplasm of lower-outer quadrant of left female breast: Secondary | ICD-10-CM | POA: Diagnosis not present

## 2015-02-15 ENCOUNTER — Ambulatory Visit
Admission: RE | Admit: 2015-02-15 | Discharge: 2015-02-15 | Disposition: A | Discharge status: Home / Self Care | Payer: Commercial Managed Care - PPO | Source: Ambulatory Visit | Attending: Radiation Oncology | Admitting: Radiation Oncology

## 2015-02-15 DIAGNOSIS — C50512 Malignant neoplasm of lower-outer quadrant of left female breast: Secondary | ICD-10-CM | POA: Diagnosis not present

## 2015-02-18 ENCOUNTER — Ambulatory Visit
Admission: RE | Admit: 2015-02-18 | Discharge: 2015-02-18 | Disposition: A | Discharge status: Home / Self Care | Payer: Commercial Managed Care - PPO | Source: Ambulatory Visit | Attending: Radiation Oncology | Admitting: Radiation Oncology

## 2015-02-18 ENCOUNTER — Ambulatory Visit: Payer: Commercial Managed Care - PPO

## 2015-02-19 ENCOUNTER — Ambulatory Visit
Admission: RE | Admit: 2015-02-19 | Discharge: 2015-02-19 | Disposition: A | Discharge status: Home / Self Care | Payer: Commercial Managed Care - PPO | Source: Ambulatory Visit | Attending: Radiation Oncology | Admitting: Radiation Oncology

## 2015-02-19 DIAGNOSIS — C50512 Malignant neoplasm of lower-outer quadrant of left female breast: Secondary | ICD-10-CM | POA: Diagnosis not present

## 2015-02-20 ENCOUNTER — Ambulatory Visit: Payer: Commercial Managed Care - PPO

## 2015-02-20 DIAGNOSIS — C50512 Malignant neoplasm of lower-outer quadrant of left female breast: Secondary | ICD-10-CM | POA: Diagnosis not present

## 2015-02-21 ENCOUNTER — Ambulatory Visit: Payer: Commercial Managed Care - PPO

## 2015-02-21 DIAGNOSIS — C50512 Malignant neoplasm of lower-outer quadrant of left female breast: Secondary | ICD-10-CM | POA: Diagnosis not present

## 2015-03-05 ENCOUNTER — Ambulatory Visit: Payer: Commercial Managed Care - PPO | Admitting: Oncology

## 2015-03-05 ENCOUNTER — Inpatient Hospital Stay: Payer: Commercial Managed Care - PPO | Attending: Oncology | Admitting: Oncology

## 2015-03-05 VITALS — BP 112/70 | HR 72 | Temp 98.1°F | Resp 16 | Wt 177.0 lb

## 2015-03-05 DIAGNOSIS — G20 Parkinson's disease: Secondary | ICD-10-CM | POA: Insufficient documentation

## 2015-03-05 DIAGNOSIS — G620 Drug-induced polyneuropathy: Secondary | ICD-10-CM

## 2015-03-05 DIAGNOSIS — Z87891 Personal history of nicotine dependence: Secondary | ICD-10-CM | POA: Diagnosis not present

## 2015-03-05 DIAGNOSIS — I10 Essential (primary) hypertension: Secondary | ICD-10-CM | POA: Insufficient documentation

## 2015-03-05 DIAGNOSIS — C50912 Malignant neoplasm of unspecified site of left female breast: Secondary | ICD-10-CM

## 2015-03-05 DIAGNOSIS — Z923 Personal history of irradiation: Secondary | ICD-10-CM | POA: Insufficient documentation

## 2015-03-05 DIAGNOSIS — C50312 Malignant neoplasm of lower-inner quadrant of left female breast: Secondary | ICD-10-CM | POA: Diagnosis present

## 2015-03-05 DIAGNOSIS — Z79811 Long term (current) use of aromatase inhibitors: Secondary | ICD-10-CM | POA: Diagnosis not present

## 2015-03-05 DIAGNOSIS — Z79899 Other long term (current) drug therapy: Secondary | ICD-10-CM | POA: Diagnosis not present

## 2015-03-05 DIAGNOSIS — Z170 Estrogen receptor positive status [ER+]: Secondary | ICD-10-CM | POA: Insufficient documentation

## 2015-03-05 NOTE — Progress Notes (Signed)
Patient has finished XRT.

## 2015-03-05 NOTE — Progress Notes (Signed)
[place]  [phone]) [phone] [phone]) [phone]  ID: Kathy Bean OB: 01-16-1951  MR#: [id number]  AOZ#:[id number]  Patient Care Team: Rusty Aus, MD as PCP - General (Internal Medicine)  CHIEF COMPLAINT:  Chief Complaint  Patient presents with  . Breast Cancer    INTERVAL HISTORY: Patient returns to clinic today for further evaluation and to start aromatase inhibitor. She has now completed her XRT and tolerated it well.  She currently feels well and is asymptomatic. She has no neurologic complaints. She denies any recent fevers or illnesses. She denies any pain. She has good appetite and denies weight loss. She has no chest pain or shortness of breath. She has no nausea, vomiting, constipation, or diarrhea. She has no urinary complaints. Patient offers no specific complaints today.  REVIEW OF SYSTEMS:   Review of Systems  Constitutional: Negative.   Respiratory: Negative.   Cardiovascular: Negative.   Gastrointestinal: Negative.   Musculoskeletal: Negative.   Neurological: Negative.     As per HPI. Otherwise, a complete review of systems is negatve.  PAST MEDICAL HISTORY: Past Medical History  Diagnosis Date  . Osteoarthritis   . Depression   . Parkinson disease ([place])   . Family history of adverse reaction to anesthesia     brother had difficulty going under anesthseia  . Hypertension   . Dysrhythmia     heart racing  . Headache     hx of migraines   period induced  . Cancer [facility]) 2016    left breast  . Breast cancer ([place]) 11/2014    left    PAST SURGICAL HISTORY: Past Surgical History  Procedure Laterality Date  . Dilation and curettage of uterus    . Laminectomy posterior lumbar facetectomy & foraminotomy w/decomp      BILATERAL LUMBAR DECOMPRESSION L2-3, L3-4, L4-5; Surgeon: Loni Dolly, MD; Location: [place]; Service: Orthopedics; Laterality: Bilateral;  . Laminectomy posterior cervicle decomp w/facetectomy &  foraminotomy      [id number] x 3...Marland KitchenMarland KitchenEXCISION CENTRAL LEFT [place] HERNIATION L 2-3; Surgeon: Loni Dolly, MD; Location: [place]; Service: Orthopedics; Laterality: Left  . Breast biopsy Left 10/2014    positive  . Partial mastectomy with needle localization Left 11/29/2014    Procedure: PARTIAL MASTECTOMY WITH NEEDLE LOCALIZATION;  Surgeon: Leonie Green, MD;  Location: [facility];  Service: General;  Laterality: Left;  . Sentinel node biopsy Left 11/29/2014    Procedure: SENTINEL NODE BIOPSY;  Surgeon: Leonie Green, MD;  Location: [facility];  Service: General;  Laterality: Left;    FAMILY HISTORY Family History  Problem Relation Age of Onset  . Breast cancer Maternal Aunt   . Breast cancer Paternal Aunt   . Coronary artery disease Mother   . Heart attack Mother   . Coronary artery disease Paternal Grandmother        ADVANCED DIRECTIVES:    HEALTH MAINTENANCE: Social History  Substance Use Topics  . Smoking status: Former Smoker    Quit date: 11/22/1999  . Smokeless tobacco: Not on file  . Alcohol Use: 8.4 oz/week    14 Glasses of wine per week     Colonoscopy:  PAP:  Bone density:  Lipid panel:  No Known Allergies  Current Outpatient Prescriptions  Medication Sig Dispense Refill  . carbidopa-levodopa (SINEMET IR) 10-100 MG tablet Take 1 tablet by mouth 3 (three) times daily.     . citalopram (CELEXA) 20 MG tablet TAKE ONE TABLET BY MOUTH DAILY. (DEPRESSION) *NO  CHILDPROOF CAPS!* take evening    . Cyanocobalamin (RA VITAMIN B-12 TR) 1000 MCG TBCR Take 1,000 mcg by mouth every morning.     Marland Kitchen dextromethorphan (DELSYM) 30 MG/5ML liquid Take by mouth at bedtime as needed.     . diphenhydrAMINE (BENADRYL) 25 mg capsule Take 50 mg by mouth at bedtime.     . fexofenadine (ALLEGRA) 180 MG tablet Take 180 mg by mouth daily.     Marland Kitchen HYDROcodone-acetaminophen (NORCO) 5-325 MG tablet Take 1-2 tablets by mouth every 4 (four) hours as needed for moderate pain. 12 tablet 0  .  ibuprofen (ADVIL,MOTRIN) 200 MG tablet Take 400 mg by mouth as needed.     Marland Kitchen oxymetazoline (AFRIN) 0.05 % nasal spray Place 1 spray into both nostrils at bedtime.     . potassium chloride (K-DUR) 10 MEQ tablet Take 10 mEq by mouth daily.     Marland Kitchen triamterene-hydrochlorothiazide (MAXZIDE-25) 37.5-25 MG tablet Take 1 tablet by mouth daily.      No current facility-administered medications for this visit.    OBJECTIVE: Filed Vitals:   03/05/15 1514  BP: 112/70  Pulse: 72  Temp: 98.1 F (36.7 C)  Resp: 16     Body mass index is 27.3 kg/(m^2).    ECOG FS:0 - Asymptomatic  General: Well-developed, well-nourished, no acute distress. Eyes: Pink conjunctiva, anicteric sclera. Breasts: Exam deferred today.  Lungs: Clear to auscultation bilaterally. Heart: Regular rate and rhythm. No rubs, murmurs, or gallops. Abdomen: Soft, nontender, nondistended. No organomegaly noted, normoactive bowel sounds. Musculoskeletal: No edema, cyanosis, or clubbing. Neuro: Alert, answering all questions appropriately. Cranial nerves grossly intact. Skin: No rashes or petechiae noted. Psych: Normal affect.   LAB RESULTS:  Lab Results  Component Value Date   NA 142 05/06/2011   K 3.6 05/06/2011   CL 105 05/06/2011   CO2 26 05/06/2011   GLUCOSE 85 05/06/2011   BUN 19* 05/06/2011   CREATININE 0.96 05/06/2011   CALCIUM 9.3 05/06/2011   GFRNONAA >60 05/06/2011   GFRAA >60 05/06/2011    Lab Results  Component Value Date   WBC 4.8 01/11/2015   HGB 14.9 01/11/2015   HCT 44.3 01/11/2015   MCV 92.2 01/11/2015   PLT 211 01/11/2015     STUDIES: No results found.  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  PLAN:    1. Breast cancer: Patient completed her lumpectomy on November 29, 2014. Final pathology as above. Oncotype was ordered, but this procedure was denied by insurance. Given the size of patient's tumor, she is likely low risk and we have  not recommended adjuvant chemotherapy.  Patient finished with adjuvant XRT. She will start taking letrozole and continue for 5 years finishing January 2022. Return to clinic in 3 months for further evaluation. She will have a baseline bone density in the next week or two.    Patient expressed understanding and was in agreement with this plan. She also understands that She can call clinic at any time with any questions, concerns, or complaints.   Breast cancer, left [facility])   Staging form: Breast, AJCC 7th Edition     Pathologic stage from 12/04/2014: Stage IA (T1b, N0, cM0) - Signed by Lloyd Huger, MD on 12/04/2014  Dr. Grayland Ormond was available for consultation and review of plan of care for this patient.  Mayra Reel, NP   03/05/2015 3:42 PM   Patient seen and evaluated independently and I agree with the assessment and  plan as dictated above.  Lloyd Huger, MD 03/06/2015 2:01 PM

## 2015-03-06 MED ORDER — LETROZOLE 2.5 MG PO TABS: 2.5000 mg | ORAL_TABLET | Freq: Every day | ORAL | Status: DC

## 2015-03-19 ENCOUNTER — Ambulatory Visit
Admission: RE | Admit: 2015-03-19 | Discharge: 2015-03-19 | Disposition: A | Discharge status: Home / Self Care | Payer: Commercial Managed Care - PPO | Source: Ambulatory Visit | Attending: Oncology | Admitting: Oncology

## 2015-03-19 DIAGNOSIS — C50912 Malignant neoplasm of unspecified site of left female breast: Secondary | ICD-10-CM

## 2015-03-19 DIAGNOSIS — M8580 Other specified disorders of bone density and structure, unspecified site: Secondary | ICD-10-CM | POA: Diagnosis not present

## 2015-03-19 DIAGNOSIS — Z13820 Encounter for screening for osteoporosis: Secondary | ICD-10-CM | POA: Diagnosis present

## 2015-03-27 ENCOUNTER — Ambulatory Visit
Admission: RE | Admit: 2015-03-27 | Discharge: 2015-03-27 | Disposition: A | Discharge status: Home / Self Care | Payer: Commercial Managed Care - PPO | Source: Ambulatory Visit | Attending: Radiation Oncology | Admitting: Radiation Oncology

## 2015-03-27 ENCOUNTER — Encounter: Payer: Self-pay | Admitting: Radiation Oncology

## 2015-03-27 VITALS — BP 126/83 | HR 86 | Temp 97.9°F | Resp 20 | Wt 177.5 lb

## 2015-03-27 DIAGNOSIS — C50912 Malignant neoplasm of unspecified site of left female breast: Secondary | ICD-10-CM

## 2015-03-27 NOTE — Progress Notes (Signed)
Radiation Oncology Follow up Note  Name: Kathy Bean   Date:   03/27/2015 MRN:  [id number] DOB: [birth year]    This 65 y.o. female presents to the clinic today for follow-up for stage IA invasive mammary carcinoma of the left breast status post wide local excision and sentinel node biopsy and adjuvant whole breast radiation now out 1 month.  REFERRING PROVIDER: Rusty Aus, MD  HPI: Patient is a 65 year old female now out 1 month having completed whole breast radiation to her left breast for a T1 be N0 M0 invasive mammary carcinoma ER/PR positive HER-2/neu negative. She is seen today in routine follow-up and is doing well.. She seen today in routine follow-up and is doing well. She started letrozole tolerating that without side effect. She specifically denies breast tenderness cough or bone pain.  COMPLICATIONS OF TREATMENT: none  FOLLOW UP COMPLIANCE: keeps appointments   PHYSICAL EXAM:  BP 126/83 mmHg  Pulse 86  Temp(Src) 97.9 F (36.6 C)  Resp 20  Wt 177 lb 7.5 oz (80.5 kg) Lungs are clear to A&P cardiac examination essentially unremarkable with regular rate and rhythm. No dominant mass or nodularity is noted in either breast in 2 positions examined. Incision is well-healed. No axillary or supraclavicular adenopathy is appreciated. Cosmetic result is excellent. Well-developed well-nourished patient in NAD. HEENT reveals PERLA, EOMI, discs not visualized.  Oral cavity is clear. No oral mucosal lesions are identified. Neck is clear without evidence of cervical or supraclavicular adenopathy. Lungs are clear to A&P. Cardiac examination is essentially unremarkable with regular rate and rhythm without murmur rub or thrill. Abdomen is benign with no organomegaly or masses noted. Motor sensory and DTR levels are equal and symmetric in the upper and lower extremities. Cranial nerves II through XII are grossly intact. Proprioception is intact. No peripheral adenopathy or edema is identified.  No motor or sensory levels are noted. Crude visual fields are within normal range.  RADIOLOGY RESULTS: No current films for review  PLAN: Present time she is doing well with no evidence of disease 1 month out. I am please were overall progress. She continues on letrozole without side effect. I have asked to see her back in 4-5 months for follow-up. Patient knows to call sooner with any concerns.  I would like to take this opportunity for allowing me to participate in the care of your patient.Armstead Peaks., MD

## 2015-04-16 DIAGNOSIS — C50112 Malignant neoplasm of central portion of left female breast: Secondary | ICD-10-CM | POA: Insufficient documentation

## 2015-06-03 ENCOUNTER — Inpatient Hospital Stay: Payer: Commercial Managed Care - PPO | Attending: Oncology | Admitting: Oncology

## 2015-06-03 VITALS — BP 98/66 | HR 82 | Temp 98.3°F | Wt 173.9 lb

## 2015-06-03 DIAGNOSIS — Z79899 Other long term (current) drug therapy: Secondary | ICD-10-CM | POA: Diagnosis not present

## 2015-06-03 DIAGNOSIS — G620 Drug-induced polyneuropathy: Secondary | ICD-10-CM

## 2015-06-03 DIAGNOSIS — Z923 Personal history of irradiation: Secondary | ICD-10-CM | POA: Diagnosis not present

## 2015-06-03 DIAGNOSIS — G20 Parkinson's disease: Secondary | ICD-10-CM | POA: Diagnosis not present

## 2015-06-03 DIAGNOSIS — Z803 Family history of malignant neoplasm of breast: Secondary | ICD-10-CM | POA: Diagnosis not present

## 2015-06-03 DIAGNOSIS — Z87891 Personal history of nicotine dependence: Secondary | ICD-10-CM | POA: Diagnosis not present

## 2015-06-03 DIAGNOSIS — Z79811 Long term (current) use of aromatase inhibitors: Secondary | ICD-10-CM | POA: Diagnosis not present

## 2015-06-03 DIAGNOSIS — M1990 Unspecified osteoarthritis, unspecified site: Secondary | ICD-10-CM | POA: Insufficient documentation

## 2015-06-03 DIAGNOSIS — C50312 Malignant neoplasm of lower-inner quadrant of left female breast: Secondary | ICD-10-CM | POA: Diagnosis not present

## 2015-06-03 DIAGNOSIS — Z170 Estrogen receptor positive status [ER+]: Secondary | ICD-10-CM | POA: Insufficient documentation

## 2015-06-03 DIAGNOSIS — M8580 Other specified disorders of bone density and structure, unspecified site: Secondary | ICD-10-CM | POA: Diagnosis not present

## 2015-06-03 DIAGNOSIS — C50912 Malignant neoplasm of unspecified site of left female breast: Secondary | ICD-10-CM

## 2015-06-03 DIAGNOSIS — I10 Essential (primary) hypertension: Secondary | ICD-10-CM | POA: Diagnosis not present

## 2015-06-03 NOTE — Progress Notes (Signed)
Patient is tolerating Letrozole. 

## 2015-06-03 NOTE — Progress Notes (Signed)
[place]  [phone] Fax:(336) [phone]  ID: Kathy Bean OB: 04/29/1950  MR#: [id number]  TOI#:[id number]  Patient Care Team: Rusty Aus, MD as PCP - General (Internal Medicine)  CHIEF COMPLAINT:  Chief Complaint  Patient presents with  . Breast Cancer    INTERVAL HISTORY: Patient returns to clinic today for further evaluation. She continues to tolerate letrozole well without side effects.  She currently feels well and is asymptomatic. She has no neurologic complaints. She denies any recent fevers or illnesses. She denies any pain. She has good appetite and denies weight loss. She has no chest pain or shortness of breath. She has no nausea, vomiting, constipation, or diarrhea. She has no urinary complaints. Patient offers no specific complaints today.  REVIEW OF SYSTEMS:   Review of Systems  Constitutional: Negative.   Respiratory: Negative.   Cardiovascular: Negative.   Gastrointestinal: Negative.   Musculoskeletal: Negative.   Neurological: Negative.     As per HPI. Otherwise, a complete review of systems is negatve.  PAST MEDICAL HISTORY: Past Medical History  Diagnosis Date  . Osteoarthritis   . Depression   . Parkinson disease ([place])   . Family history of adverse reaction to anesthesia     brother had difficulty going under anesthseia  . Hypertension   . Dysrhythmia     heart racing  . Headache     hx of migraines   period induced  . Cancer [facility]) 2016    left breast  . Breast cancer ([place]) 11/2014    left    PAST SURGICAL HISTORY: Past Surgical History  Procedure Laterality Date  . Dilation and curettage of uterus    . Laminectomy posterior lumbar facetectomy & foraminotomy w/decomp      BILATERAL LUMBAR DECOMPRESSION L2-3, L3-4, L4-5; Surgeon: Loni Dolly, MD; Location: [place]; Service: Orthopedics; Laterality: Bilateral;  . Laminectomy posterior cervicle decomp w/facetectomy & foraminotomy      [id number] x  3...Marland KitchenMarland KitchenEXCISION CENTRAL LEFT [place] HERNIATION L 2-3; Surgeon: Loni Dolly, MD; Location: [place]; Service: Orthopedics; Laterality: Left  . Breast biopsy Left 10/2014    positive  . Partial mastectomy with needle localization Left 11/29/2014    Procedure: PARTIAL MASTECTOMY WITH NEEDLE LOCALIZATION;  Surgeon: Leonie Green, MD;  Location: [facility];  Service: General;  Laterality: Left;  . Sentinel node biopsy Left 11/29/2014    Procedure: SENTINEL NODE BIOPSY;  Surgeon: Leonie Green, MD;  Location: [facility];  Service: General;  Laterality: Left;    FAMILY HISTORY Family History  Problem Relation Age of Onset  . Breast cancer Maternal Aunt   . Breast cancer Paternal Aunt   . Coronary artery disease Mother   . Heart attack Mother   . Coronary artery disease Paternal Grandmother        ADVANCED DIRECTIVES:    HEALTH MAINTENANCE: Social History  Substance Use Topics  . Smoking status: Former Smoker    Quit date: 11/22/1999  . Smokeless tobacco: Not on file  . Alcohol Use: 8.4 oz/week    14 Glasses of wine per week     No Known Allergies  Current Outpatient Prescriptions  Medication Sig Dispense Refill  . carbidopa-levodopa (SINEMET IR) 10-100 MG tablet Take 1 tablet by mouth 3 (three) times daily.     . citalopram (CELEXA) 20 MG tablet TAKE ONE TABLET BY MOUTH DAILY. (DEPRESSION) *NO CHILDPROOF CAPS!* take evening    . Cyanocobalamin (RA VITAMIN B-12 TR) 1000 MCG TBCR  Take 1,000 mcg by mouth every morning.     Marland Kitchen dextromethorphan (DELSYM) 30 MG/5ML liquid Take by mouth at bedtime as needed.     . diphenhydrAMINE (BENADRYL) 25 mg capsule Take 50 mg by mouth at bedtime.     . fexofenadine (ALLEGRA) 180 MG tablet Take 180 mg by mouth daily.     Marland Kitchen HYDROcodone-acetaminophen (NORCO) 5-325 MG tablet Take 1-2 tablets by mouth every 4 (four) hours as needed for moderate pain. 12 tablet 0  . ibuprofen (ADVIL,MOTRIN) 200 MG tablet Take 400 mg by mouth as needed.     Marland Kitchen  letrozole (FEMARA) 2.5 MG tablet Take 1 tablet (2.5 mg total) by mouth daily. 30 tablet 11  . oxymetazoline (AFRIN) 0.05 % nasal spray Place 1 spray into both nostrils at bedtime.     . potassium chloride (K-DUR) 10 MEQ tablet Take 10 mEq by mouth daily.     Marland Kitchen triamterene-hydrochlorothiazide (MAXZIDE-25) 37.5-25 MG tablet Take 1 tablet by mouth daily.      No current facility-administered medications for this visit.    OBJECTIVE: Filed Vitals:   06/03/15 1518  BP: 98/66  Pulse: 82  Temp: 98.3 F (36.8 C)     Body mass index is 26.83 kg/(m^2).    ECOG FS:0 - Asymptomatic  General: Well-developed, well-nourished, no acute distress. Eyes: Pink conjunctiva, anicteric sclera. Breasts: Exam deferred today.  Lungs: Clear to auscultation bilaterally. Heart: Regular rate and rhythm. No rubs, murmurs, or gallops. Abdomen: Soft, nontender, nondistended. No organomegaly noted, normoactive bowel sounds. Musculoskeletal: No edema, cyanosis, or clubbing. Neuro: Alert, answering all questions appropriately. Cranial nerves grossly intact. Skin: No rashes or petechiae noted. Psych: Normal affect.   LAB RESULTS:  Lab Results  Component Value Date   NA 142 05/06/2011   K 3.6 05/06/2011   CL 105 05/06/2011   CO2 26 05/06/2011   GLUCOSE 85 05/06/2011   BUN 19* 05/06/2011   CREATININE 0.96 05/06/2011   CALCIUM 9.3 05/06/2011   GFRNONAA >60 05/06/2011   GFRAA >60 05/06/2011    Lab Results  Component Value Date   WBC 4.8 01/11/2015   HGB 14.9 01/11/2015   HCT 44.3 01/11/2015   MCV 92.2 01/11/2015   PLT 211 01/11/2015     STUDIES: No results found.  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  PLAN:    1. Breast cancer: Patient completed her lumpectomy on November 29, 2014. Oncotype was ordered, but this procedure was denied by insurance. Given the size of patient's tumor, she is likely low risk and we have not recommended  adjuvant chemotherapy.  Patient finished with adjuvant XRT. Continue letrozole for 5 years finishing January 2022. Mammogram due October 2017. Return to clinic in 6 months for further evaluation.  2. Osteopenia: Bone density was done 03/19/15 with results -1.2, repeat in one year. Patient will start Calcium/Vit D twice daily.   Patient expressed understanding and was in agreement with this plan. She also understands that She can call clinic at any time with any questions, concerns, or complaints.   Breast cancer, left [facility])   Staging form: Breast, AJCC 7th Edition     Pathologic stage from 12/04/2014: Stage IA (T1b, N0, cM0) - Signed by Lloyd Huger, MD on 12/04/2014  Dr. Grayland Ormond was available for consultation and review of plan of care for this patient.  Mayra Reel, NP   06/03/2015 3:35 PM   Patient was seen and evaluated independently and I agree  with the assessment and plan as dictated above.  Lloyd Huger, MD 06/04/2015 10:04 AM

## 2015-08-28 ENCOUNTER — Ambulatory Visit
Admission: RE | Admit: 2015-08-28 | Discharge: 2015-08-28 | Disposition: A | Discharge status: Home / Self Care | Payer: Commercial Managed Care - PPO | Source: Ambulatory Visit | Attending: Radiation Oncology | Admitting: Radiation Oncology

## 2015-08-28 ENCOUNTER — Other Ambulatory Visit: Payer: Self-pay | Admitting: *Deleted

## 2015-08-28 ENCOUNTER — Encounter: Payer: Self-pay | Admitting: Radiation Oncology

## 2015-08-28 VITALS — BP 128/82 | HR 87 | Temp 97.7°F | Resp 18 | Wt 171.3 lb

## 2015-08-28 DIAGNOSIS — C50912 Malignant neoplasm of unspecified site of left female breast: Secondary | ICD-10-CM | POA: Diagnosis present

## 2015-08-28 DIAGNOSIS — Z79811 Long term (current) use of aromatase inhibitors: Secondary | ICD-10-CM | POA: Insufficient documentation

## 2015-08-28 DIAGNOSIS — Z923 Personal history of irradiation: Secondary | ICD-10-CM | POA: Diagnosis not present

## 2015-08-28 DIAGNOSIS — Z170 Estrogen receptor positive status [ER+]: Secondary | ICD-10-CM | POA: Insufficient documentation

## 2015-08-28 NOTE — Progress Notes (Signed)
Radiation Oncology Follow up Note  Name: Kathy Bean   Date:   08/28/2015 MRN:  [id number] DOB: 07/01/1950    This 65 y.o. female presents to the clinic today for six-month follow-up for stage I invasive mammary carcinoma of the left breast status post whole breast radiation.  REFERRING PROVIDER: Rusty Aus, MD  HPI: Patient is a 65 year old female now out 6 months having completed radiation therapy to her left breast for stage TI N0 M0 invasive carcinoma ER/PR positive HER-2/neu not overexpressed.. She seen today in routine follow-up is doing well. She specifically denies breast tenderness cough or bone pain. Currently on letrozole tolerating that well without side effect has not yet had a follow-up mammogram.  COMPLICATIONS OF TREATMENT: none  FOLLOW UP COMPLIANCE: keeps appointments   PHYSICAL EXAM:  BP 128/82 mmHg  Pulse 87  Temp(Src) 97.7 F (36.5 C) (Tympanic)  Resp 18  Wt 171 lb 4.8 oz (77.7 kg)  SpO2 96% Lungs are clear to A&P cardiac examination essentially unremarkable with regular rate and rhythm. No dominant mass or nodularity is noted in either breast in 2 positions examined. Incision is well-healed. No axillary or supraclavicular adenopathy is appreciated. Cosmetic result is excellent. Well-developed well-nourished patient in NAD. HEENT reveals PERLA, EOMI, discs not visualized.  Oral cavity is clear. No oral mucosal lesions are identified. Neck is clear without evidence of cervical or supraclavicular adenopathy. Lungs are clear to A&P. Cardiac examination is essentially unremarkable with regular rate and rhythm without murmur rub or thrill. Abdomen is benign with no organomegaly or masses noted. Motor sensory and DTR levels are equal and symmetric in the upper and lower extremities. Cranial nerves II through XII are grossly intact. Proprioception is intact. No peripheral adenopathy or edema is identified. No motor or sensory levels are noted. Crude visual fields are  within normal range.  RADIOLOGY RESULTS: Bilateral mammograms ordered  PLAN: Present time she continues to do well with no evidence of disease. I have ordered bilateral diagnostic mammograms. I've asked to see her back in 6 months for follow-up. She continues on letrozole without side effect. Patient knows to call sooner with any concerns. I've also asked her to make a follow-up appointment with her surgeon.  I would like to take this opportunity to thank you for allowing me to participate in the care of your patient.Armstead Peaks., MD

## 2015-09-03 ENCOUNTER — Other Ambulatory Visit: Payer: Self-pay | Admitting: *Deleted

## 2015-09-09 ENCOUNTER — Telehealth: Payer: Self-pay

## 2015-10-24 ENCOUNTER — Ambulatory Visit
Admission: RE | Admit: 2015-10-24 | Discharge: 2015-10-24 | Disposition: A | Discharge status: Home / Self Care | Payer: Commercial Managed Care - PPO | Source: Ambulatory Visit | Attending: Radiation Oncology | Admitting: Radiation Oncology

## 2015-10-24 ENCOUNTER — Other Ambulatory Visit: Payer: Self-pay | Admitting: Radiation Oncology

## 2015-10-24 DIAGNOSIS — C50912 Malignant neoplasm of unspecified site of left female breast: Secondary | ICD-10-CM

## 2015-12-02 NOTE — Progress Notes (Signed)
[place]  Telephone:(336) [phone] Fax:(336) [phone]  ID: Kathy Bean OB: 30-Mar-1950  MR#: [id number]  CZY#:[id number]  Patient Care Team: Rusty Aus, MD as PCP - General (Internal Medicine)  CHIEF COMPLAINT: Pathologic stage Ia ER/PR+, HER-2 not overexpressing adenocarcinoma of the lower inner quadrant of the left breast.  INTERVAL HISTORY: Patient returns to clinic today for routine 6 month evaluation. She continues to tolerate letrozole well without side effects.  She currently feels well and is asymptomatic. She has no neurologic complaints. She denies any recent fevers or illnesses. She denies any pain. She has a good appetite and denies weight loss. She has no chest pain or shortness of breath. She has no nausea, vomiting, constipation, or diarrhea. She has no urinary complaints. Patient offers no specific complaints today.  REVIEW OF SYSTEMS:   Review of Systems  Constitutional: Negative.  Negative for fever, malaise/fatigue and weight loss.  Respiratory: Negative.  Negative for cough and shortness of breath.   Cardiovascular: Negative.  Negative for chest pain and leg swelling.  Gastrointestinal: Negative.  Negative for abdominal pain.  Genitourinary: Negative.   Musculoskeletal: Negative.   Neurological: Negative.  Negative for weakness.  Psychiatric/Behavioral: Negative.  The patient is not nervous/anxious.    As per HPI. Otherwise, a complete review of systems is negative.   PAST MEDICAL HISTORY: Past Medical History:  Diagnosis Date  . Breast cancer ([place]) 11/2014   left  . Cancer [facility]) 2016   left breast  . Depression   . Dysrhythmia    heart racing  . Family history of adverse reaction to anesthesia    brother had difficulty going under anesthseia  . Headache    hx of migraines   period induced  . Hypertension   . Osteoarthritis   . Parkinson disease ([place])     PAST SURGICAL HISTORY: Past Surgical History:  Procedure Laterality  Date  . BREAST BIOPSY Left 10/2014   positive  . BREAST EXCISIONAL BIOPSY Left 11/29/2014   invasive mam ca +  . DILATION AND CURETTAGE OF UTERUS    . Laminectomy Posterior Cervicle Decomp W/Facetectomy & Foraminotomy     [id number] x 3...Marland KitchenMarland KitchenEXCISION CENTRAL LEFT [place] HERNIATION L 2-3; Surgeon: Loni Dolly, MD; Location: [place]; Service: Orthopedics; Laterality: Left  . Laminectomy Posterior Lumbar Facetectomy & Foraminotomy W/Decomp     BILATERAL LUMBAR DECOMPRESSION L2-3, L3-4, L4-5; Surgeon: Loni Dolly, MD; Location: [place]; Service: Orthopedics; Laterality: Bilateral;  . PARTIAL MASTECTOMY WITH NEEDLE LOCALIZATION Left 11/29/2014   Procedure: PARTIAL MASTECTOMY WITH NEEDLE LOCALIZATION;  Surgeon: Leonie Green, MD;  Location: [facility];  Service: General;  Laterality: Left;  . SENTINEL NODE BIOPSY Left 11/29/2014   Procedure: SENTINEL NODE BIOPSY;  Surgeon: Leonie Green, MD;  Location: [facility];  Service: General;  Laterality: Left;    FAMILY HISTORY Family History  Problem Relation Age of Onset  . Breast cancer Maternal Aunt   . Breast cancer Paternal Aunt   . Coronary artery disease Mother   . Heart attack Mother   . Coronary artery disease Paternal Grandmother        ADVANCED DIRECTIVES:    HEALTH MAINTENANCE: Social History  Substance Use Topics  . Smoking status: Former Smoker    Quit date: 11/22/1999  . Smokeless tobacco: Not on file  . Alcohol use 8.4 oz/week    14 Glasses of wine per week     No Known Allergies  Current Outpatient Prescriptions  Medication Sig  Dispense Refill  . carbidopa-levodopa (SINEMET IR) 10-100 MG tablet Take 1 tablet by mouth 3 (three) times daily.     . citalopram (CELEXA) 20 MG tablet TAKE ONE TABLET BY MOUTH DAILY. (DEPRESSION) *NO CHILDPROOF CAPS!* take evening    . Cyanocobalamin (RA VITAMIN B-12 TR) 1000 MCG TBCR Take 1,000 mcg by mouth every morning.     Marland Kitchen dextromethorphan (DELSYM) 30 MG/5ML liquid Take  by mouth at bedtime as needed.     . diphenhydrAMINE (BENADRYL) 25 mg capsule Take 50 mg by mouth at bedtime.     . fexofenadine (ALLEGRA) 180 MG tablet Take 180 mg by mouth daily.     Marland Kitchen ibuprofen (ADVIL,MOTRIN) 200 MG tablet Take 400 mg by mouth as needed.     Marland Kitchen letrozole (FEMARA) 2.5 MG tablet Take 1 tablet (2.5 mg total) by mouth daily. 30 tablet 11  . oxymetazoline (AFRIN) 0.05 % nasal spray Place 1 spray into both nostrils at bedtime.     . potassium chloride (K-DUR) 10 MEQ tablet Take 10 mEq by mouth daily.     Marland Kitchen triamterene-hydrochlorothiazide (MAXZIDE-25) 37.5-25 MG tablet Take 1 tablet by mouth daily.      No current facility-administered medications for this visit.     OBJECTIVE: Vitals:   12/03/15 1457  BP: (!) 128/93  Pulse: 82  Resp: 18  Temp: 98.3 F (36.8 C)     Body mass index is 26.79 kg/m.    ECOG FS:0 - Asymptomatic  General: Well-developed, well-nourished, no acute distress. Eyes: Pink conjunctiva, anicteric sclera. Breasts: Bilateral breast and axilla without lumps or masses.  Lungs: Clear to auscultation bilaterally. Heart: Regular rate and rhythm. No rubs, murmurs, or gallops. Abdomen: Soft, nontender, nondistended. No organomegaly noted, normoactive bowel sounds. Musculoskeletal: No edema, cyanosis, or clubbing. Neuro: Alert, answering all questions appropriately. Cranial nerves grossly intact. Skin: No rashes or petechiae noted. Psych: Normal affect.   LAB RESULTS:  Lab Results  Component Value Date   NA 142 05/06/2011   K 3.6 05/06/2011   CL 105 05/06/2011   CO2 26 05/06/2011   GLUCOSE 85 05/06/2011   BUN 19 (H) 05/06/2011   CREATININE 0.96 05/06/2011   CALCIUM 9.3 05/06/2011   GFRNONAA >60 05/06/2011   GFRAA >60 05/06/2011    Lab Results  Component Value Date   WBC 4.8 01/11/2015   HGB 14.9 01/11/2015   HCT 44.3 01/11/2015   MCV 92.2 01/11/2015   PLT 211 01/11/2015     STUDIES: No results found.  ASSESSMENT: Pathologic stage  Ia ER/PR+, HER-2 not overexpressing adenocarcinoma of the lower inner quadrant of the left breast.  PLAN:    1. 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 Oncotype was ordered, but this procedure was denied by insurance. Given the size of patient's tumor, she is likely low risk and she did not receive adjuvant chemotherapy.  Patient finished with adjuvant XRT. Continue letrozole for 5 years finishing January 2022. Her most recent mammogram on October 24, 2015 was reported as BI-RADS 2, repeat in September 2018. Return to clinic in 6 months for further evaluation.  2. Osteopenia: Bone density was done 03/19/15 with results -1.2, repeat in there were 2018. Patient has been instructed to continue with calcium and vitamin D supplementation.   Patient expressed understanding and was in agreement with this plan. She also understands that She can call clinic at any time  with any questions, concerns, or complaints.   Breast cancer, left [facility])   Staging form: Breast, AJCC 7th Edition     Pathologic stage from 12/04/2014: Stage IA (T1b, N0, cM0) - Signed by Lloyd Huger, MD on 12/04/2014   Lloyd Huger, MD   12/08/2015 9:10 AM

## 2015-12-03 ENCOUNTER — Inpatient Hospital Stay: Payer: Commercial Managed Care - PPO | Attending: Oncology | Admitting: Oncology

## 2015-12-03 VITALS — BP 128/93 | HR 82 | Temp 98.3°F | Resp 18 | Wt 173.6 lb

## 2015-12-03 DIAGNOSIS — C50312 Malignant neoplasm of lower-inner quadrant of left female breast: Secondary | ICD-10-CM | POA: Diagnosis present

## 2015-12-03 DIAGNOSIS — Z79899 Other long term (current) drug therapy: Secondary | ICD-10-CM | POA: Diagnosis not present

## 2015-12-03 DIAGNOSIS — F329 Major depressive disorder, single episode, unspecified: Secondary | ICD-10-CM | POA: Diagnosis not present

## 2015-12-03 DIAGNOSIS — Z170 Estrogen receptor positive status [ER+]: Secondary | ICD-10-CM | POA: Diagnosis not present

## 2015-12-03 DIAGNOSIS — Z8669 Personal history of other diseases of the nervous system and sense organs: Secondary | ICD-10-CM

## 2015-12-03 DIAGNOSIS — Z87891 Personal history of nicotine dependence: Secondary | ICD-10-CM | POA: Diagnosis not present

## 2015-12-03 DIAGNOSIS — I499 Cardiac arrhythmia, unspecified: Secondary | ICD-10-CM | POA: Insufficient documentation

## 2015-12-03 DIAGNOSIS — M1990 Unspecified osteoarthritis, unspecified site: Secondary | ICD-10-CM | POA: Diagnosis not present

## 2015-12-03 DIAGNOSIS — Z79811 Long term (current) use of aromatase inhibitors: Secondary | ICD-10-CM | POA: Insufficient documentation

## 2015-12-03 DIAGNOSIS — Z803 Family history of malignant neoplasm of breast: Secondary | ICD-10-CM | POA: Diagnosis not present

## 2015-12-03 DIAGNOSIS — G20 Parkinson's disease: Secondary | ICD-10-CM

## 2015-12-03 DIAGNOSIS — I10 Essential (primary) hypertension: Secondary | ICD-10-CM | POA: Insufficient documentation

## 2015-12-03 DIAGNOSIS — M8580 Other specified disorders of bone density and structure, unspecified site: Secondary | ICD-10-CM | POA: Insufficient documentation

## 2015-12-03 NOTE — Progress Notes (Signed)
States is feeling well. Offers no complaints. Questions if can cancel appts with surgeon and Dr. Baruch Gouty since is keeping routine appts with Dr. Grayland Ormond.

## 2016-02-12 ENCOUNTER — Other Ambulatory Visit: Payer: Self-pay | Admitting: *Deleted

## 2016-02-12 MED ORDER — LETROZOLE 2.5 MG PO TABS: 2.5000 mg | ORAL_TABLET | Freq: Every day | ORAL | 3 refills | Status: DC

## 2016-02-20 ENCOUNTER — Ambulatory Visit: Payer: Commercial Managed Care - PPO | Admitting: Radiation Oncology

## 2016-03-18 ENCOUNTER — Ambulatory Visit
Admission: RE | Admit: 2016-03-18 | Discharge: 2016-03-18 | Disposition: A | Discharge status: Home / Self Care | Payer: Commercial Managed Care - PPO | Source: Ambulatory Visit | Attending: Oncology | Admitting: Oncology

## 2016-03-18 DIAGNOSIS — M85851 Other specified disorders of bone density and structure, right thigh: Secondary | ICD-10-CM | POA: Diagnosis not present

## 2016-03-18 DIAGNOSIS — Z780 Asymptomatic menopausal state: Secondary | ICD-10-CM | POA: Insufficient documentation

## 2016-03-18 DIAGNOSIS — C50312 Malignant neoplasm of lower-inner quadrant of left female breast: Secondary | ICD-10-CM | POA: Diagnosis not present

## 2016-06-01 NOTE — Progress Notes (Signed)
[place]  Telephone:(336) [phone] Fax:(336) [phone]  ID: Kathy Bean OB: 04-Oct-1950  MR#: [id number]  DHR#:[id number]  Patient Care Team: Rusty Aus, MD as PCP - General (Internal Medicine)  CHIEF COMPLAINT: Pathologic stage Ia ER/PR+, HER-2 not overexpressing adenocarcinoma of the lower inner quadrant of the left breast.  INTERVAL HISTORY: Patient returns to clinic today for routine 6 month evaluation. She continues to tolerate letrozole well without side effects.  She currently feels well and is asymptomatic. She has no neurologic complaints. She denies any recent fevers or illnesses. She denies any pain. She has a good appetite and denies weight loss. She has no chest pain or shortness of breath. She has no nausea, vomiting, constipation, or diarrhea. She has no urinary complaints. Patient offers no specific complaints today.  REVIEW OF SYSTEMS:   Review of Systems  Constitutional: Negative.  Negative for fever, malaise/fatigue and weight loss.  Respiratory: Negative.  Negative for cough and shortness of breath.   Cardiovascular: Negative.  Negative for chest pain and leg swelling.  Gastrointestinal: Negative.  Negative for abdominal pain.  Genitourinary: Negative.   Musculoskeletal: Negative.   Skin: Negative.  Negative for rash.  Neurological: Negative.  Negative for sensory change and weakness.  Psychiatric/Behavioral: Negative.  The patient is not nervous/anxious.    As per HPI. Otherwise, a complete review of systems is negative.   PAST MEDICAL HISTORY: Past Medical History:  Diagnosis Date  . Breast cancer ([place]) 11/2014   left  . Cancer [facility]) 2016   left breast  . Depression   . Dysrhythmia    heart racing  . Family history of adverse reaction to anesthesia    brother had difficulty going under anesthseia  . Headache    hx of migraines   period induced  . Hypertension   . Osteoarthritis   . Parkinson disease ([place])     PAST  SURGICAL HISTORY: Past Surgical History:  Procedure Laterality Date  . BREAST BIOPSY Left 10/2014   positive  . BREAST EXCISIONAL BIOPSY Left 11/29/2014   invasive mam ca +  . DILATION AND CURETTAGE OF UTERUS    . Laminectomy Posterior Cervicle Decomp W/Facetectomy & Foraminotomy     [id number] x 3...Marland KitchenMarland KitchenEXCISION CENTRAL LEFT [place] HERNIATION L 2-3; Surgeon: Loni Dolly, MD; Location: [place]; Service: Orthopedics; Laterality: Left  . Laminectomy Posterior Lumbar Facetectomy & Foraminotomy W/Decomp     BILATERAL LUMBAR DECOMPRESSION L2-3, L3-4, L4-5; Surgeon: Loni Dolly, MD; Location: [place]; Service: Orthopedics; Laterality: Bilateral;  . PARTIAL MASTECTOMY WITH NEEDLE LOCALIZATION Left 11/29/2014   Procedure: PARTIAL MASTECTOMY WITH NEEDLE LOCALIZATION;  Surgeon: Leonie Green, MD;  Location: [facility];  Service: General;  Laterality: Left;  . SENTINEL NODE BIOPSY Left 11/29/2014   Procedure: SENTINEL NODE BIOPSY;  Surgeon: Leonie Green, MD;  Location: [facility];  Service: General;  Laterality: Left;    FAMILY HISTORY Family History  Problem Relation Age of Onset  . Breast cancer Maternal Aunt   . Breast cancer Paternal Aunt   . Coronary artery disease Mother   . Heart attack Mother   . Coronary artery disease Paternal Grandmother        ADVANCED DIRECTIVES:    HEALTH MAINTENANCE: Social History  Substance Use Topics  . Smoking status: Former Smoker    Quit date: 11/22/1999  . Smokeless tobacco: Not on file  . Alcohol use 8.4 oz/week    14 Glasses of wine per week  No Known Allergies  Current Outpatient Prescriptions  Medication Sig Dispense Refill  . carbidopa-levodopa (SINEMET IR) 10-100 MG tablet Take 1 tablet by mouth 3 (three) times daily.     . citalopram (CELEXA) 20 MG tablet TAKE ONE TABLET BY MOUTH DAILY. (DEPRESSION) *NO CHILDPROOF CAPS!* take evening    . Cyanocobalamin (RA VITAMIN B-12 TR) 1000 MCG TBCR Take 1,000 mcg by mouth every  morning.     . fexofenadine (ALLEGRA) 180 MG tablet Take 180 mg by mouth daily.     Marland Kitchen ibuprofen (ADVIL,MOTRIN) 200 MG tablet Take 600 mg by mouth daily.     Marland Kitchen letrozole (FEMARA) 2.5 MG tablet Take 1 tablet (2.5 mg total) by mouth daily. 90 tablet 3  . oxymetazoline (AFRIN) 0.05 % nasal spray Place 1 spray into both nostrils at bedtime.     . potassium chloride (K-DUR) 10 MEQ tablet Take 10 mEq by mouth daily.     Marland Kitchen triamterene-hydrochlorothiazide (MAXZIDE-25) 37.5-25 MG tablet Take 1 tablet by mouth daily.     . Biotin 1000 MCG tablet Take by mouth.    . dextromethorphan (DELSYM) 30 MG/5ML liquid Take by mouth at bedtime as needed.     . diphenhydrAMINE (BENADRYL) 25 mg capsule Take 50 mg by mouth at bedtime.      No current facility-administered medications for this visit.     OBJECTIVE: Vitals:   06/02/16 1458  BP: 108/74  Pulse: 80  Resp: 18  Temp: 98.5 F (36.9 C)     Body mass index is 26.82 kg/m.    ECOG FS:0 - Asymptomatic  General: Well-developed, well-nourished, no acute distress. Eyes: Pink conjunctiva, anicteric sclera. Breasts: Bilateral breast and axilla without lumps or masses.  Lungs: Clear to auscultation bilaterally. Heart: Regular rate and rhythm. No rubs, murmurs, or gallops. Abdomen: Soft, nontender, nondistended. No organomegaly noted, normoactive bowel sounds. Musculoskeletal: No edema, cyanosis, or clubbing. Neuro: Alert, answering all questions appropriately. Cranial nerves grossly intact. Skin: No rashes or petechiae noted. Psych: Normal affect.   LAB RESULTS:  Lab Results  Component Value Date   NA 142 05/06/2011   K 3.6 05/06/2011   CL 105 05/06/2011   CO2 26 05/06/2011   GLUCOSE 85 05/06/2011   BUN 19 (H) 05/06/2011   CREATININE 0.96 05/06/2011   CALCIUM 9.3 05/06/2011   GFRNONAA >60 05/06/2011   GFRAA >60 05/06/2011    Lab Results  Component Value Date   WBC 4.8 01/11/2015   HGB 14.9 01/11/2015   HCT 44.3 01/11/2015   MCV 92.2  01/11/2015   PLT 211 01/11/2015     STUDIES: No results found.  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  PLAN:    1. 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 Oncotype was ordered, but was denied by insurance. Given the size of patient's tumor, she is likely low risk and she did not receive adjuvant chemotherapy.  Patient finished with adjuvant XRT. Continue letrozole for 5 years completing in January 2022. Her most recent mammogram on October 24, 2015 was reported as BI-RADS 2, repeat in September 2018. Return to clinic in 6 months for further evaluation.  2. Osteopenia: Bone density completed on March 18, 2016 reported a T score of -1.1. This is essentially unchanged from one year prior.  Patient has been instructed to continue with calcium  and vitamin D supplementation.   Patient expressed understanding and was in agreement with this plan. She also understands that She can call clinic at any time with any questions, concerns, or complaints.   Breast cancer, left [facility])   Staging form: Breast, AJCC 7th Edition     Pathologic stage from 12/04/2014: Stage IA (T1b, N0, cM0) - Signed by Lloyd Huger, MD on 12/04/2014   Lloyd Huger, MD   06/05/2016 3:26 PM

## 2016-06-02 ENCOUNTER — Inpatient Hospital Stay: Payer: Medicare Other | Attending: Oncology | Admitting: Oncology

## 2016-06-02 VITALS — BP 108/74 | HR 80 | Temp 98.5°F | Resp 18 | Wt 173.8 lb

## 2016-06-02 DIAGNOSIS — Z87891 Personal history of nicotine dependence: Secondary | ICD-10-CM | POA: Insufficient documentation

## 2016-06-02 DIAGNOSIS — M1990 Unspecified osteoarthritis, unspecified site: Secondary | ICD-10-CM | POA: Insufficient documentation

## 2016-06-02 DIAGNOSIS — F329 Major depressive disorder, single episode, unspecified: Secondary | ICD-10-CM | POA: Insufficient documentation

## 2016-06-02 DIAGNOSIS — M8580 Other specified disorders of bone density and structure, unspecified site: Secondary | ICD-10-CM | POA: Diagnosis not present

## 2016-06-02 DIAGNOSIS — I499 Cardiac arrhythmia, unspecified: Secondary | ICD-10-CM | POA: Diagnosis not present

## 2016-06-02 DIAGNOSIS — C50312 Malignant neoplasm of lower-inner quadrant of left female breast: Secondary | ICD-10-CM

## 2016-06-02 DIAGNOSIS — G20 Parkinson's disease: Secondary | ICD-10-CM | POA: Insufficient documentation

## 2016-06-02 DIAGNOSIS — Z170 Estrogen receptor positive status [ER+]: Secondary | ICD-10-CM | POA: Diagnosis not present

## 2016-06-02 DIAGNOSIS — Z79899 Other long term (current) drug therapy: Secondary | ICD-10-CM | POA: Diagnosis not present

## 2016-06-02 DIAGNOSIS — I10 Essential (primary) hypertension: Secondary | ICD-10-CM | POA: Diagnosis not present

## 2016-06-02 DIAGNOSIS — Z79811 Long term (current) use of aromatase inhibitors: Secondary | ICD-10-CM | POA: Diagnosis not present

## 2016-06-02 DIAGNOSIS — Z803 Family history of malignant neoplasm of breast: Secondary | ICD-10-CM | POA: Insufficient documentation

## 2016-06-02 DIAGNOSIS — Z8669 Personal history of other diseases of the nervous system and sense organs: Secondary | ICD-10-CM | POA: Insufficient documentation

## 2016-06-11 DIAGNOSIS — I10 Essential (primary) hypertension: Secondary | ICD-10-CM | POA: Insufficient documentation

## 2016-10-27 ENCOUNTER — Ambulatory Visit: Payer: Medicare Other

## 2016-10-27 DIAGNOSIS — E782 Mixed hyperlipidemia: Secondary | ICD-10-CM | POA: Insufficient documentation

## 2016-10-30 ENCOUNTER — Ambulatory Visit
Admission: RE | Admit: 2016-10-30 | Discharge: 2016-10-30 | Disposition: A | Discharge status: Home / Self Care | Payer: Medicare Other | Source: Ambulatory Visit | Attending: Oncology | Admitting: Oncology

## 2016-10-30 DIAGNOSIS — C50312 Malignant neoplasm of lower-inner quadrant of left female breast: Secondary | ICD-10-CM | POA: Diagnosis not present

## 2016-12-02 NOTE — Progress Notes (Signed)
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  INTERVAL HISTORY: Patient returns to clinic today for routine 6 month evaluation. She continues to tolerate letrozole well without significant side effects. She currently feels well and is asymptomatic. She has no neurologic complaints. She denies any recent fevers or illnesses. She denies any pain. She has a good appetite and denies weight loss. She has no chest pain or shortness of breath. She has no nausea, vomiting, constipation, or diarrhea. She has no urinary complaints. Patient offers no specific complaints today.  REVIEW OF SYSTEMS:   Review of Systems  Constitutional: Negative.  Negative for fever, malaise/fatigue and weight loss.  Respiratory: Negative.  Negative for cough and shortness of breath.   Cardiovascular: Negative.  Negative for chest pain and leg swelling.  Gastrointestinal: Negative.  Negative for abdominal pain.  Genitourinary: Negative.   Musculoskeletal: Negative.   Skin: Negative.  Negative for rash.  Neurological: Negative.  Negative for sensory change and weakness.  Psychiatric/Behavioral: Negative.  The patient is not nervous/anxious.    As per HPI. Otherwise, a complete review of systems is negative.   PAST MEDICAL HISTORY: Past Medical History:  Diagnosis Date  . Breast cancer ([place]) 11/2014   left  . Cancer [facility]) 2016   left breast  . Depression   . Dysrhythmia    heart racing  . Family history of adverse reaction to anesthesia    brother had difficulty going under anesthseia  . Headache    hx of migraines   period induced  . Hypertension   . Osteoarthritis   . Parkinson disease ([place])      PAST SURGICAL HISTORY: Past Surgical History:  Procedure Laterality Date  . BREAST BIOPSY Left 10/2014   positive  . BREAST EXCISIONAL BIOPSY Left 11/29/2014   invasive mam ca +  . DILATION AND CURETTAGE OF UTERUS    . Laminectomy Posterior Cervicle Decomp W/Facetectomy & Foraminotomy     [id number] x 3...Marland KitchenMarland KitchenEXCISION CENTRAL LEFT [place] HERNIATION L 2-3; Surgeon: Loni Dolly, MD; Location: [place]; Service: Orthopedics; Laterality: Left  . Laminectomy Posterior Lumbar Facetectomy & Foraminotomy W/Decomp     BILATERAL LUMBAR DECOMPRESSION L2-3, L3-4, L4-5; Surgeon: Loni Dolly, MD; Location: [place]; Service: Orthopedics; Laterality: Bilateral;  . PARTIAL MASTECTOMY WITH NEEDLE LOCALIZATION Left 11/29/2014   Procedure: PARTIAL MASTECTOMY WITH NEEDLE LOCALIZATION;  Surgeon: Leonie Green, MD;  Location: [facility];  Service: General;  Laterality: Left;  . SENTINEL NODE BIOPSY Left 11/29/2014   Procedure: SENTINEL NODE BIOPSY;  Surgeon: Leonie Green, MD;  Location: [facility];  Service: General;  Laterality: Left;    FAMILY HISTORY Family History  Problem Relation Age of Onset  . Breast cancer Maternal Aunt   . Breast cancer Paternal Aunt   . Coronary artery disease Mother   . Heart attack Mother   . Coronary artery disease Paternal Grandmother        ADVANCED DIRECTIVES:    HEALTH MAINTENANCE: Social History  Substance Use Topics  . Smoking status: Former Smoker    Quit date: 11/22/1999  . Smokeless tobacco: Not on file  . Alcohol use 8.4 oz/week    14 Glasses of wine per week  No Known Allergies  Current Outpatient Prescriptions  Medication Sig Dispense Refill  . Biotin 1000 MCG tablet Take by mouth.    . carbidopa-levodopa (SINEMET IR) 10-100 MG tablet Take 1 tablet by mouth 3 (three) times daily.     . citalopram (CELEXA) 20 MG tablet TAKE ONE TABLET BY MOUTH DAILY. (DEPRESSION) *NO CHILDPROOF CAPS!* take evening    . Cyanocobalamin (RA  VITAMIN B-12 TR) 1000 MCG TBCR Take 1,000 mcg by mouth every morning.     Marland Kitchen dextromethorphan (DELSYM) 30 MG/5ML liquid Take by mouth at bedtime as needed.     . diphenhydrAMINE (BENADRYL) 25 mg capsule Take 50 mg by mouth at bedtime.     . fexofenadine (ALLEGRA) 180 MG tablet Take 180 mg by mouth daily.     Marland Kitchen HYDROcodone-acetaminophen (NORCO/VICODIN) 5-325 MG tablet Take by mouth.    Marland Kitchen ibuprofen (ADVIL,MOTRIN) 200 MG tablet Take 600 mg by mouth daily.     Marland Kitchen letrozole (FEMARA) 2.5 MG tablet Take 1 tablet (2.5 mg total) by mouth daily. 90 tablet 3  . oxymetazoline (AFRIN) 0.05 % nasal spray Place 1 spray into both nostrils at bedtime.     . potassium chloride (K-DUR) 10 MEQ tablet Take 10 mEq by mouth daily.     Marland Kitchen triamterene-hydrochlorothiazide (MAXZIDE-25) 37.5-25 MG tablet Take 1 tablet by mouth daily.      No current facility-administered medications for this visit.     OBJECTIVE: Vitals:   12/03/16 1424  BP: 117/85  Pulse: 79  Resp: 20  Temp: 97.8 F (36.6 C)     Body mass index is 26.79 kg/m.    ECOG FS:0 - Asymptomatic  General: Well-developed, well-nourished, no acute distress. Eyes: Pink conjunctiva, anicteric sclera. Breasts: Bilateral breast and axilla without lumps or masses.  Lungs: Clear to auscultation bilaterally. Heart: Regular rate and rhythm. No rubs, murmurs, or gallops. Abdomen: Soft, nontender, nondistended. No organomegaly noted, normoactive bowel sounds. Musculoskeletal: No edema, cyanosis, or clubbing. Neuro: Alert, answering all questions appropriately. Cranial nerves grossly intact. Skin: No rashes or petechiae noted. Psych: Normal affect.   LAB RESULTS:  Lab Results  Component Value Date   NA 142 05/06/2011   K 3.6 05/06/2011   CL 105 05/06/2011   CO2 26 05/06/2011   GLUCOSE 85 05/06/2011   BUN 19 (H) 05/06/2011   CREATININE 0.96 05/06/2011   CALCIUM 9.3 05/06/2011   GFRNONAA >60 05/06/2011   GFRAA >60 05/06/2011    Lab Results   Component Value Date   WBC 4.8 01/11/2015   HGB 14.9 01/11/2015   HCT 44.3 01/11/2015   MCV 92.2 01/11/2015   PLT 211 01/11/2015     STUDIES: No results found.  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  PLAN:    1. 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 Oncotype was ordered, but was denied by insurance. Given the size of patient's tumor, she is likely low risk and she did not receive adjuvant chemotherapy. Patient completed adjuvant XRT. Continue letrozole for 5 years completing in January 2022. Her most recent mammogram on October 30, 2016 was reported as BI-RADS 2, repeat in September 2019. Return to clinic in 6 months for further evaluation.  2. Osteopenia: Bone density completed on March 18, 2016 reported a T score of -1.1. This is essentially unchanged from one year  prior.  Patient has been instructed to continue with calcium and vitamin D supplementation. Repeat in February 2019.  Patient expressed understanding and was in agreement with this plan. She also understands that She can call clinic at any time with any questions, concerns, or complaints.   Breast cancer, left [facility])   Staging form: Breast, AJCC 7th Edition     Pathologic stage from 12/04/2014: Stage IA (T1b, N0, cM0) - Signed by Lloyd Huger, MD on 12/04/2014   Lloyd Huger, MD   12/04/2016 10:28 AM

## 2016-12-03 ENCOUNTER — Inpatient Hospital Stay: Payer: Medicare Other | Attending: Oncology | Admitting: Oncology

## 2016-12-03 VITALS — BP 117/85 | HR 79 | Temp 97.8°F | Resp 20 | Wt 173.6 lb

## 2016-12-03 DIAGNOSIS — Z79899 Other long term (current) drug therapy: Secondary | ICD-10-CM | POA: Diagnosis not present

## 2016-12-03 DIAGNOSIS — Z923 Personal history of irradiation: Secondary | ICD-10-CM | POA: Diagnosis not present

## 2016-12-03 DIAGNOSIS — M1990 Unspecified osteoarthritis, unspecified site: Secondary | ICD-10-CM | POA: Diagnosis not present

## 2016-12-03 DIAGNOSIS — I10 Essential (primary) hypertension: Secondary | ICD-10-CM | POA: Diagnosis not present

## 2016-12-03 DIAGNOSIS — Z87891 Personal history of nicotine dependence: Secondary | ICD-10-CM | POA: Insufficient documentation

## 2016-12-03 DIAGNOSIS — C50312 Malignant neoplasm of lower-inner quadrant of left female breast: Secondary | ICD-10-CM | POA: Diagnosis not present

## 2016-12-03 DIAGNOSIS — G20 Parkinson's disease: Secondary | ICD-10-CM | POA: Diagnosis not present

## 2016-12-03 DIAGNOSIS — Z170 Estrogen receptor positive status [ER+]: Secondary | ICD-10-CM | POA: Insufficient documentation

## 2016-12-03 DIAGNOSIS — Z803 Family history of malignant neoplasm of breast: Secondary | ICD-10-CM | POA: Diagnosis not present

## 2016-12-03 DIAGNOSIS — Z79811 Long term (current) use of aromatase inhibitors: Secondary | ICD-10-CM | POA: Insufficient documentation

## 2016-12-03 DIAGNOSIS — M8580 Other specified disorders of bone density and structure, unspecified site: Secondary | ICD-10-CM | POA: Insufficient documentation

## 2016-12-03 NOTE — Progress Notes (Signed)
Patient denies any concerns today.  

## 2017-01-06 ENCOUNTER — Encounter: Payer: Self-pay | Admitting: Oncology

## 2017-02-04 ENCOUNTER — Encounter: Payer: Self-pay | Admitting: Oncology

## 2017-02-04 ENCOUNTER — Other Ambulatory Visit: Payer: Self-pay | Admitting: *Deleted

## 2017-02-04 MED ORDER — ANASTROZOLE 1 MG PO TABS
1.0000 mg | ORAL_TABLET | Freq: Every day | ORAL | 3 refills | Status: DC
Start: 2017-02-04 — End: 2017-05-03

## 2017-02-04 NOTE — Progress Notes (Signed)
NA

## 2017-02-27 IMAGING — MG MM DIGITAL DIAGNOSTIC UNILAT*L*
2 series · 2 of 2 positions shown · non-contrast
Comparison: Previous exam(s).

CLINICAL DATA: Evaluate clip placement following ultrasound-guided
left breast biopsy.

EXAM:
DIAGNOSTIC LEFT MAMMOGRAM POST ULTRASOUND BIOPSY

[L ML]
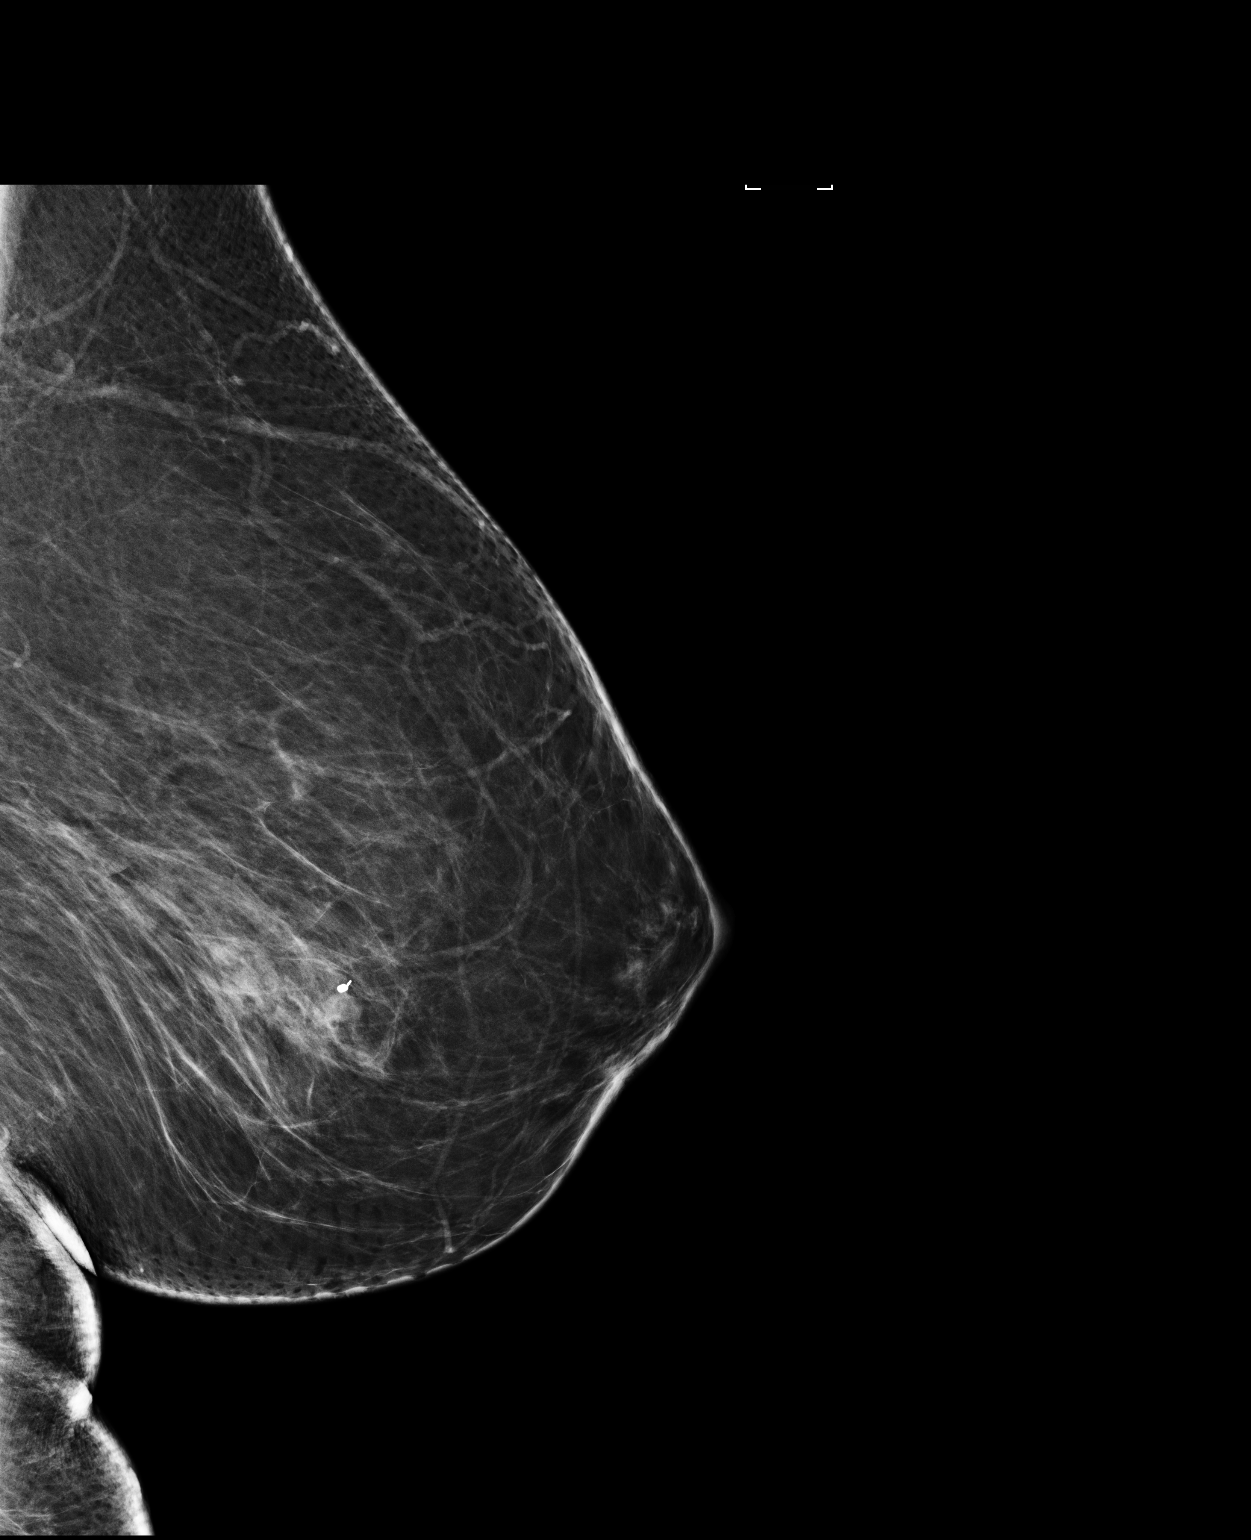

[L CC]
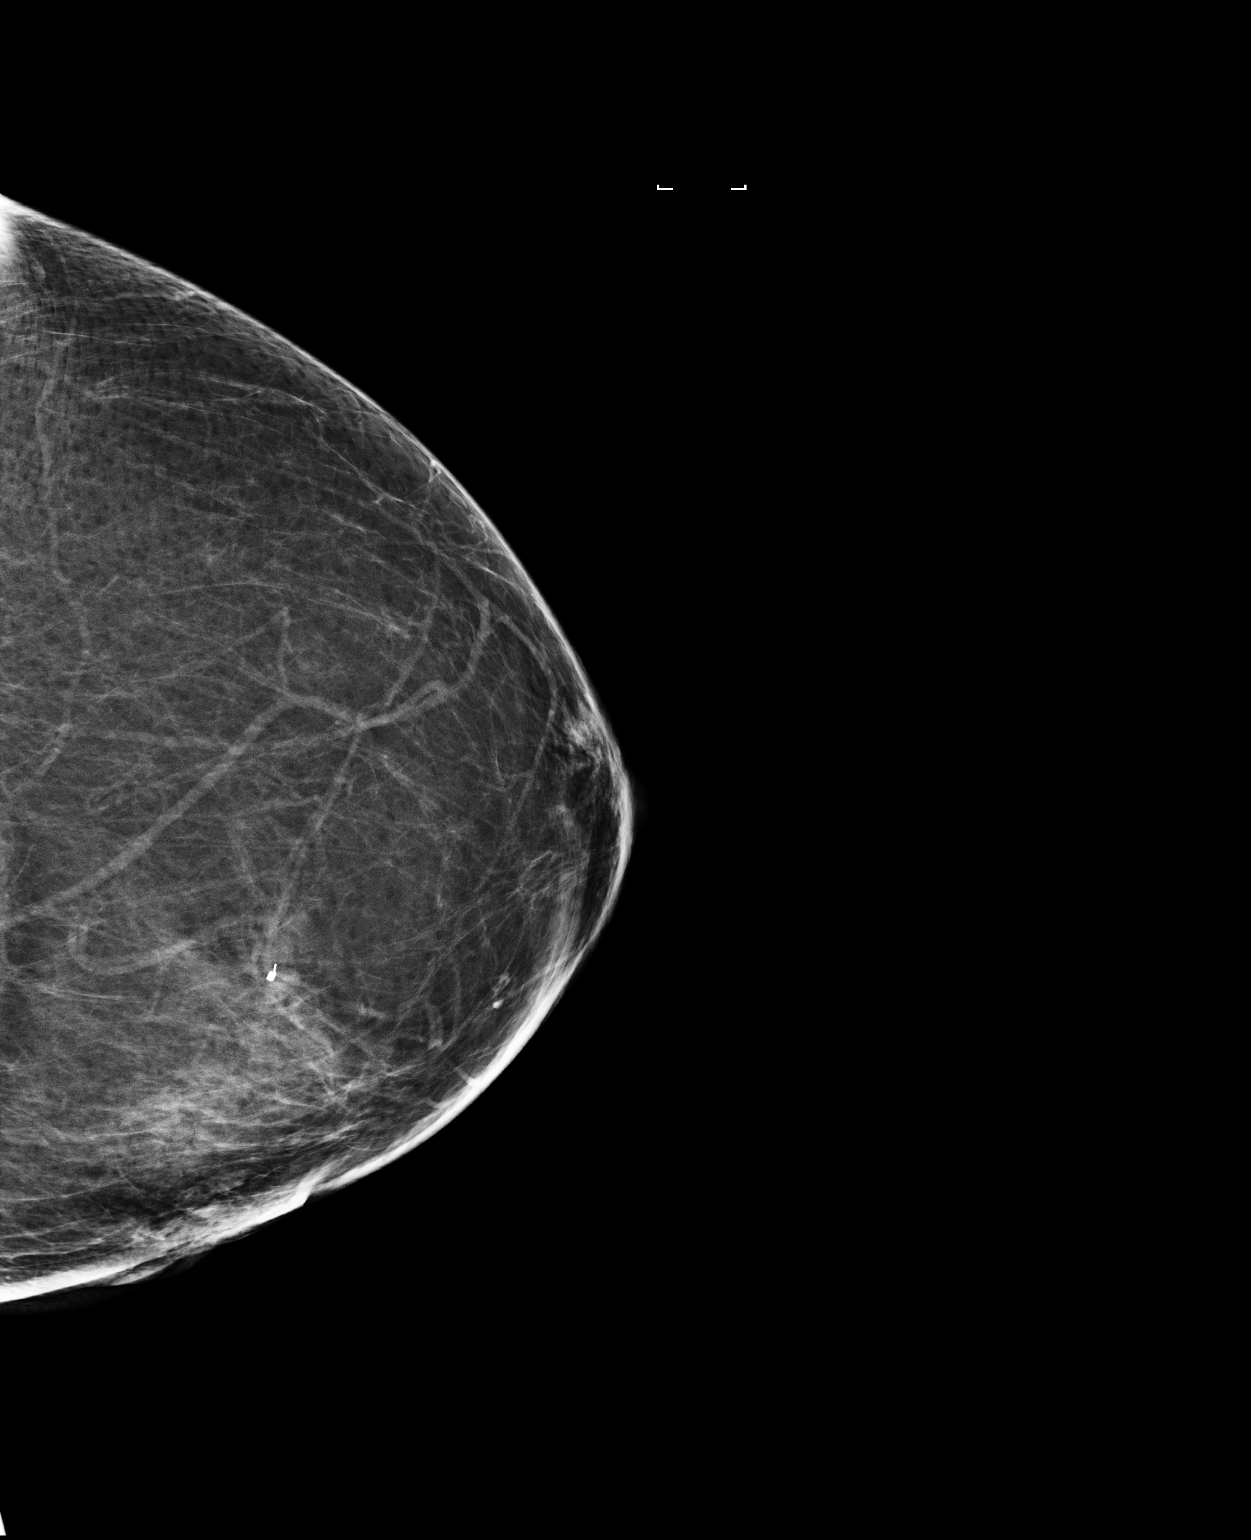

[2 of 2 positions shown; findings below may reference images not displayed]

FINDINGS: Mammographic images were obtained following ultrasound guided biopsy
of 8 mm mass at the 8 o'clock position of the left breast..

The coil shaped clip is in satisfactory position. A small amount of
post biopsy hemorrhage is noted.
IMPRESSION: Satisfactory clip position following ultrasound-guided left breast
biopsy.

Small amount of post biopsy hemorrhage.

Final Assessment: Post Procedure Mammograms for Marker Placement

## 2017-02-27 IMAGING — US US BREAST BX W LOC DEV 1ST LESION IMG BX SPEC US GUIDE*L*
1 series · 10 of 10 positions shown · non-contrast
Comparison: Previous exam(s).

ADDENDUM:
Pathology of the left breast biopsy revealed INVASIVE MAMMARY
CARCINOMA, NO SPECIAL TYPE. PRELIMINARY GRADE: 1 These findings were
communicated to Dr. Sabatha on 11/13/2014. Read back procedure was
performed. (by pathologist).

The patient was contacted on 11/09/14 for a post biopsy check by
Peredniene, Dfghfjftj ([REDACTED]). She stated she had done
well following the biopsy with no bleeding, bruising, or palpable
hematoma. The patient was contacted again on 11/15/14. She had been
given the results and referred to Dr. Jabok Gasing. She is scheduled
for surgery with pre-operative needle localization on [REDACTED],
November 29, 2014 (per patient). She also has an appointment pre
surgery with an oncologist.
Dictation by Peredniene, Dfghfjftj on 11/15/14.
CLINICAL DATA: 63-year-old female for tissue sampling of 8 mm mass
in the lower inner left breast.
EXAM:
ULTRASOUND GUIDED LEFT BREAST CORE NEEDLE BIOPSY

[Series 1: us breast bx w loc dev 1st lesion img bx spec us g · 0.07mm/px · 10 of 10 slices shown]
[im 1/10]
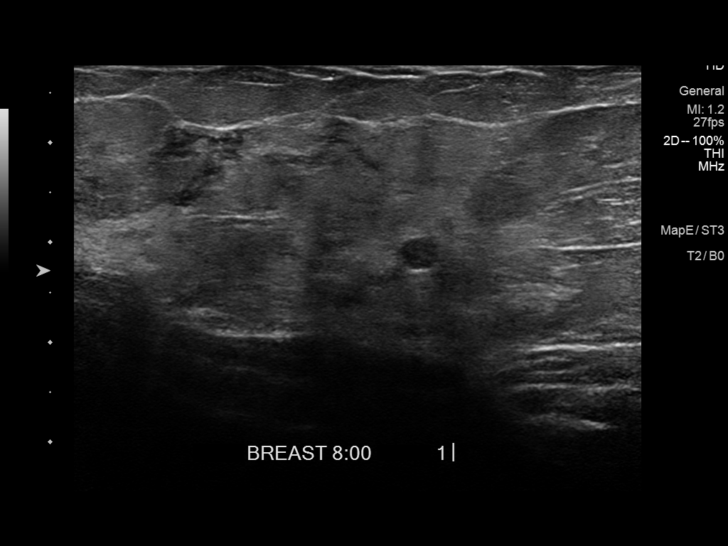
[im 2/10]
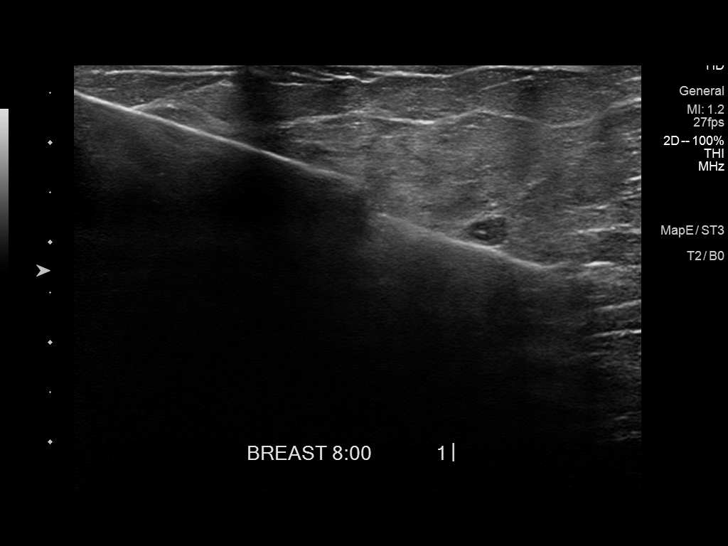
[im 3/10]
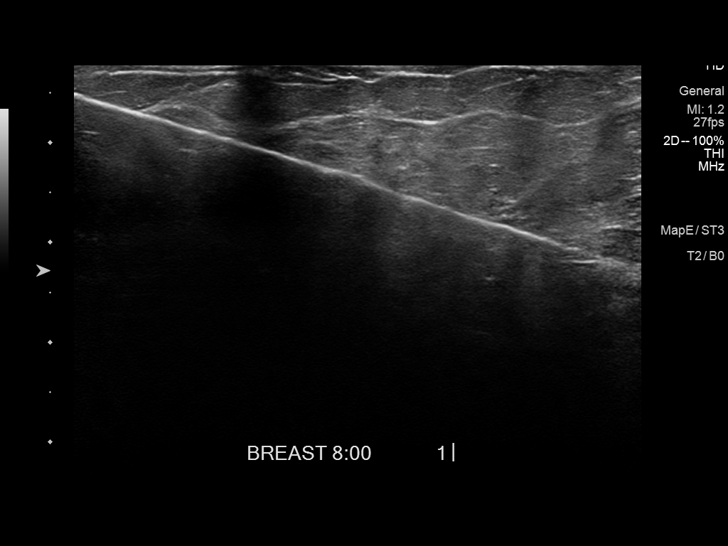
[im 4/10]
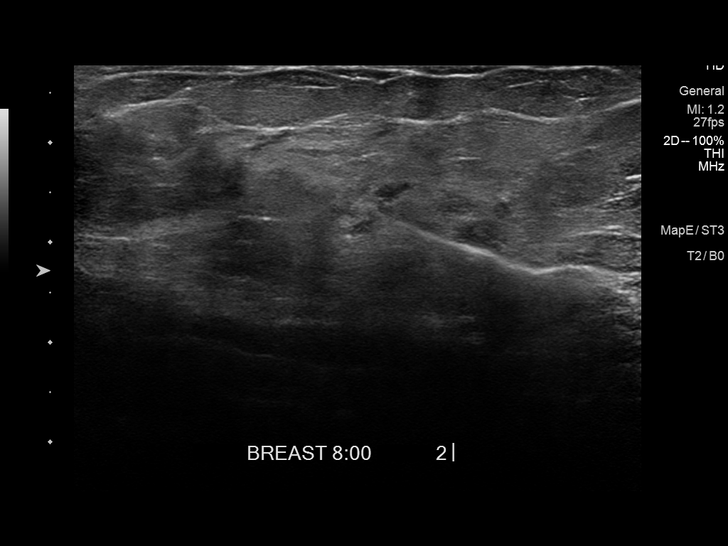
[im 5/10]
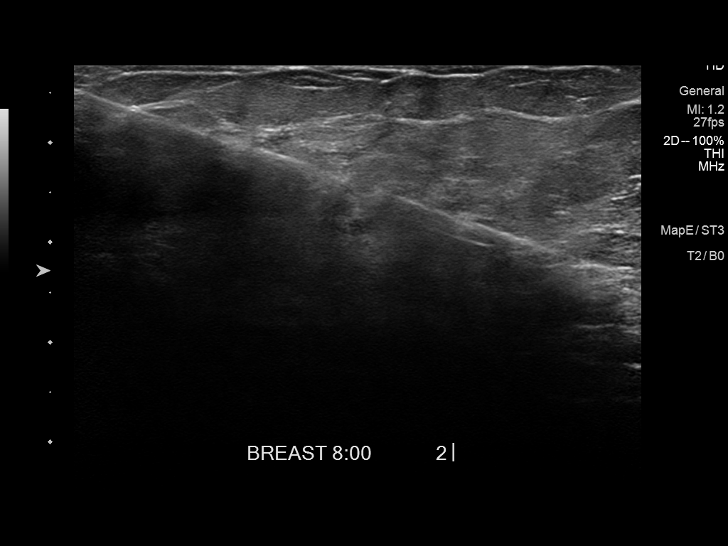
[im 6/10]
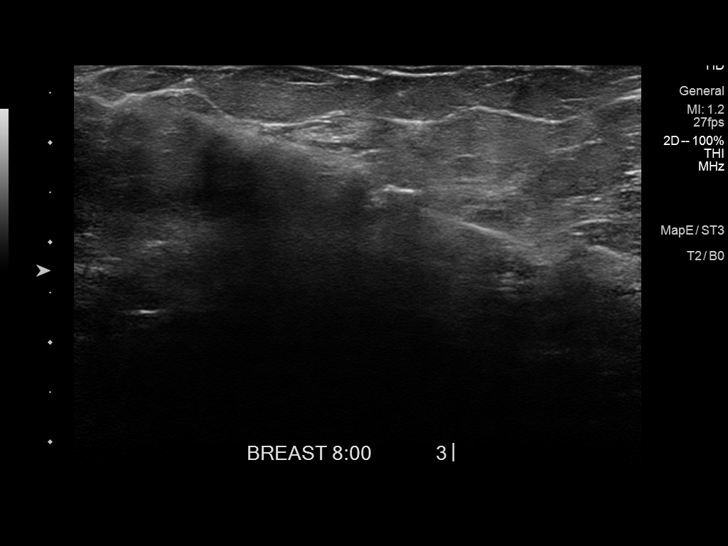
[im 7/10]
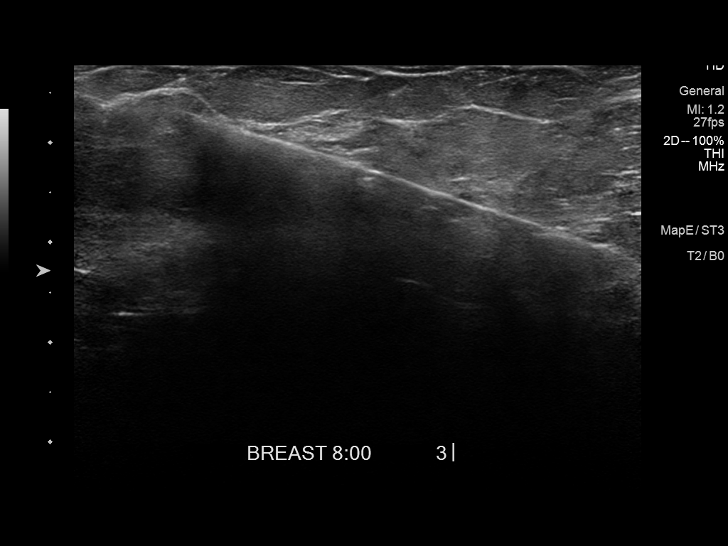
[im 8/10]
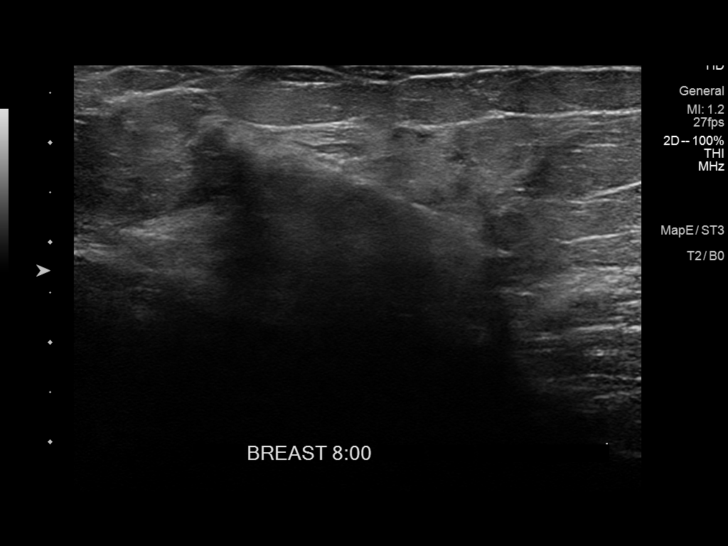
[im 9/10]
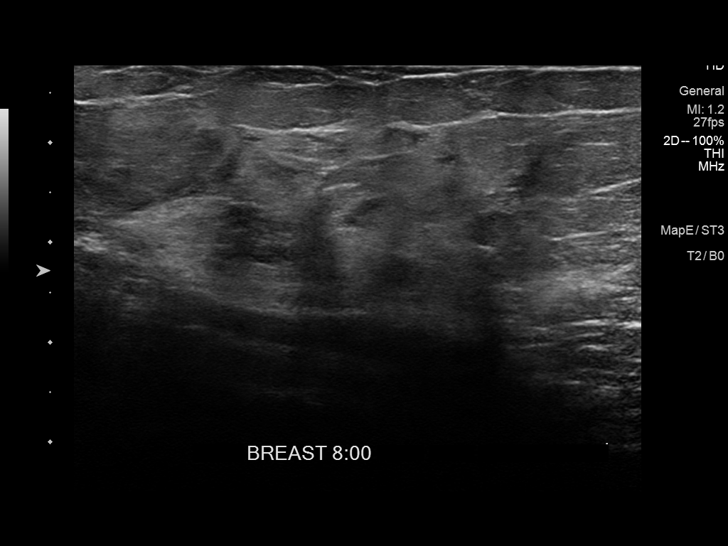
[im 10/10]
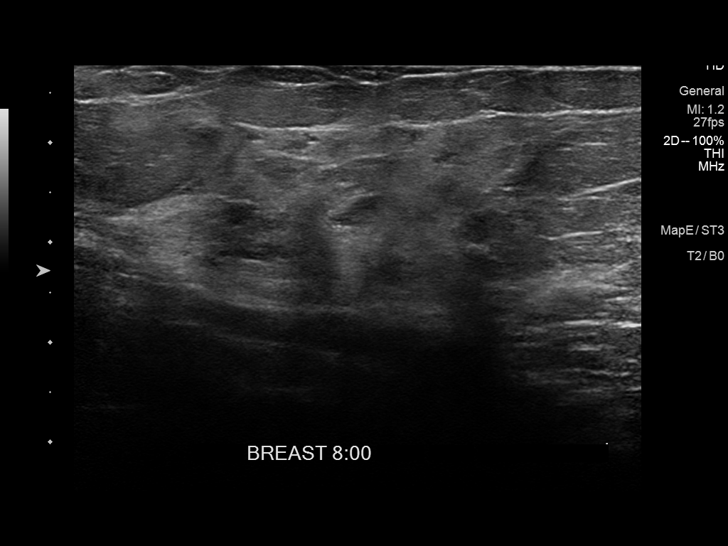

[10 of 10 positions shown; findings below may reference images not displayed]

PROCEDURE:
I met with the patient and we discussed the procedure of
ultrasound-guided biopsy, including benefits and alternatives. We
discussed the high likelihood of a successful procedure. We
discussed the risks of the procedure including infection, bleeding,
tissue injury, clip migration, and inadequate sampling. Informed
written consent was given. The usual time-out protocol was performed
immediately prior to the procedure.

Using sterile technique and 2% Lidocaine as local anesthetic, under
direct ultrasound visualization, a 12 gauge vacuum-assisted device
was used to perform biopsy of the 8 mm hypoechoic mass at the 8
o'clock position of the left breast 8 cm from the nipple using a
medial approach. At the conclusion of the procedure, a coil shaped
tissue marker clip was deployed into the biopsy cavity. Follow-up
2-view mammogram was performed and dictated separately.
IMPRESSION: Ultrasound-guided biopsy of lower inner left breast mass. No
apparent complications.

Pathology will be followed.

## 2017-03-20 IMAGING — MG MM LT PLC BREAST LOC DEV   1ST LESION  INC MAMMO GUIDE
5 series · 5 of 5 positions shown · non-contrast
Comparison: Previous exams.

CLINICAL DATA: Biopsy-proven invasive mammary carcinoma within the
left breast. Patient presents today for needle localization for
surgical excision.

EXAM:
NEEDLE LOCALIZATION OF THE LEFT BREAST WITH MAMMO GUIDANCE

[L CC (1 of 2)]
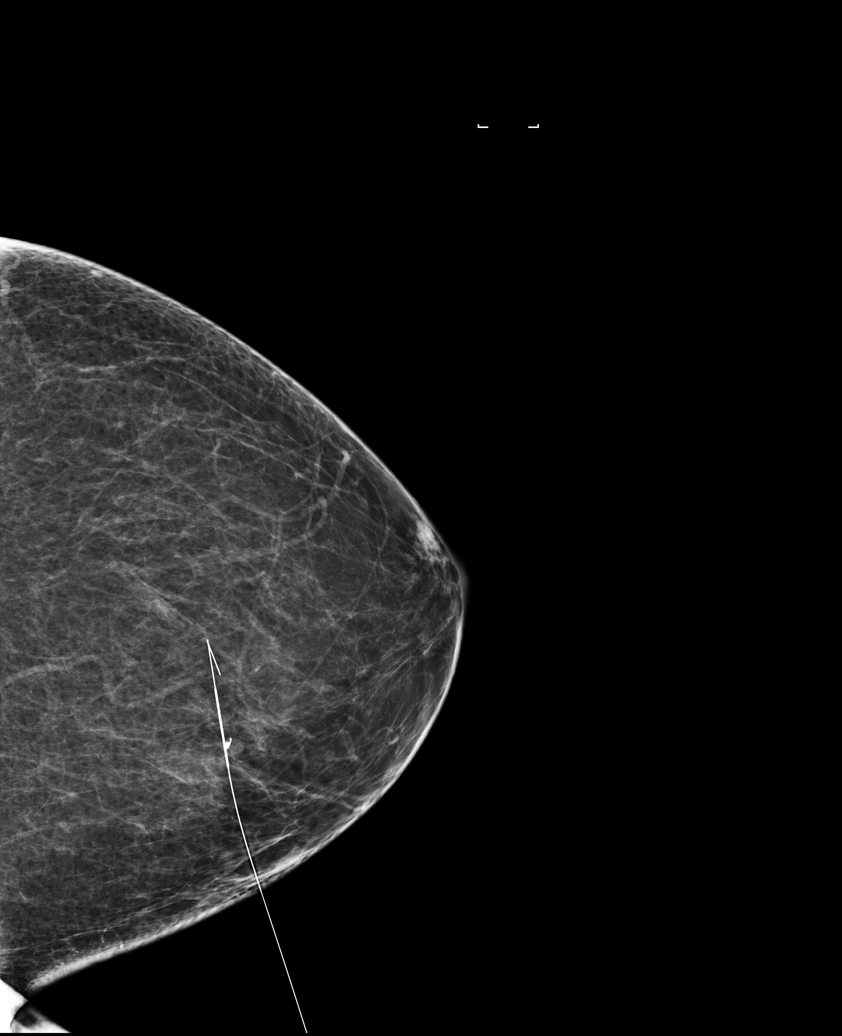

[L ML (1 of 3)]
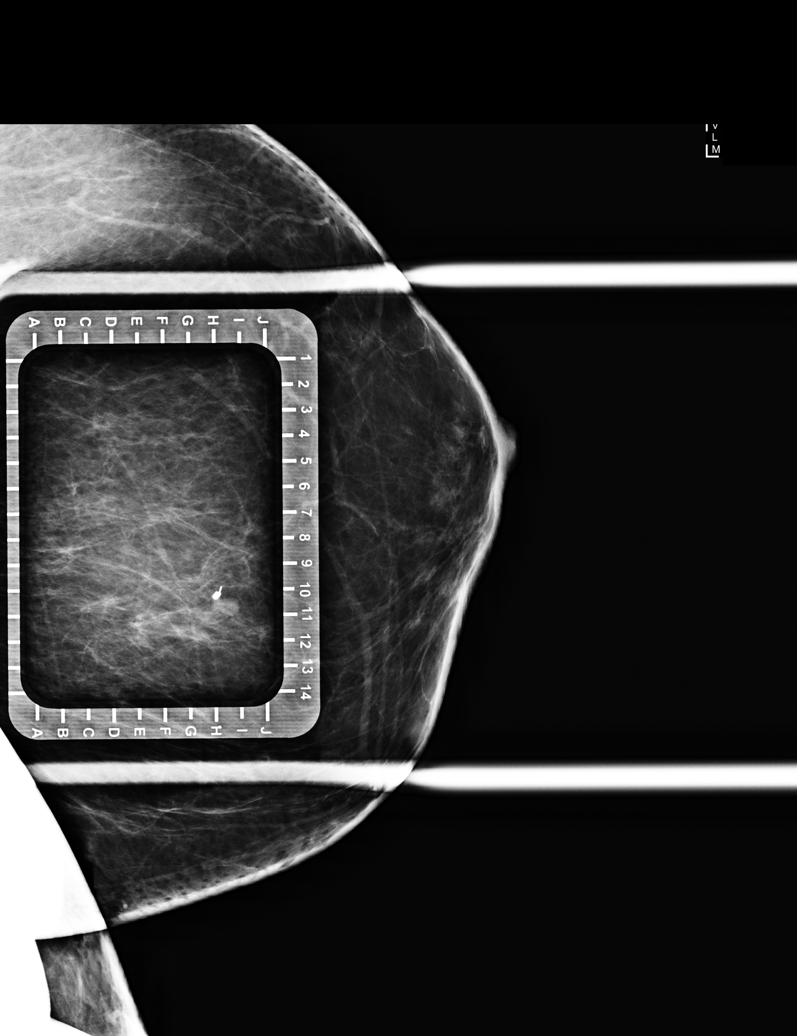

[L ML (2 of 3)]
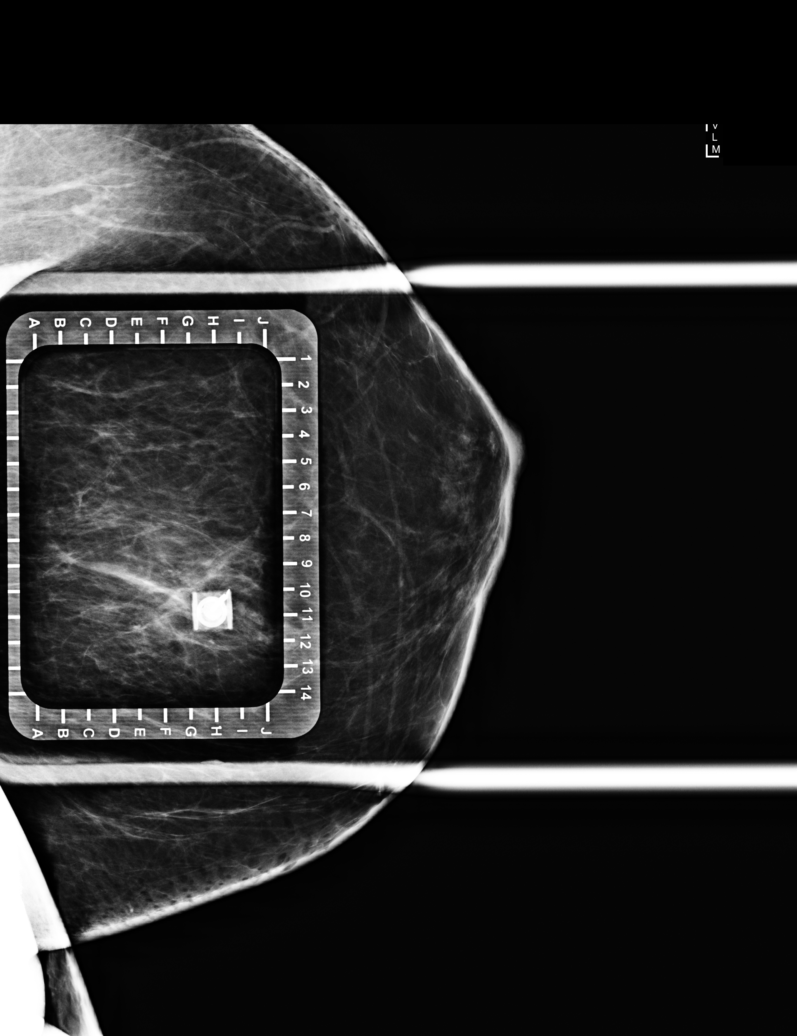

[L CC (2 of 2)]
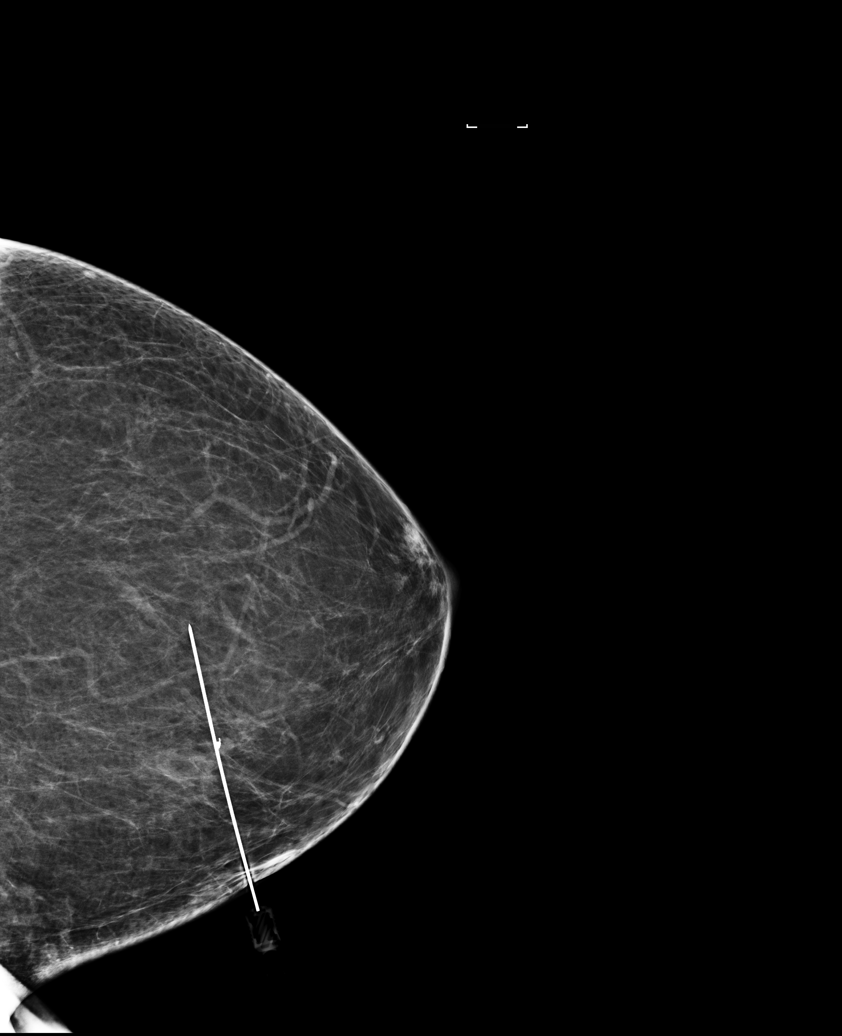

[L ML (3 of 3)]
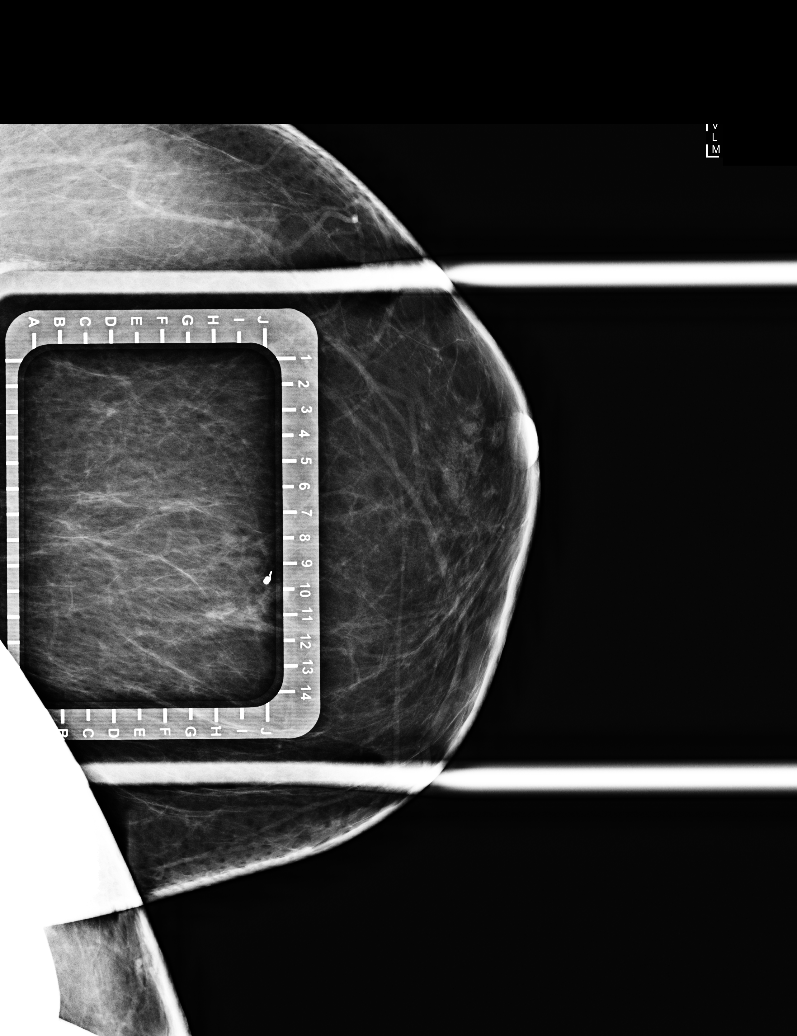

[5 of 5 positions shown; findings below may reference images not displayed]

FINDINGS: Patient presents for needle localization prior to surgical excision.
I met with the patient and we discussed the procedure of needle
localization including benefits and alternatives. We discussed the
high likelihood of a successful procedure. We discussed the risks of
the procedure, including infection, bleeding, tissue injury, and
further surgery. Informed, written consent was given. The usual
time-out protocol was performed immediately prior to the procedure.

Using mammographic guidance, sterile technique, 2% lidocaine and a 7
cm modified Kopans needle, the coil shaped biopsy clip was localized
using medial approach. The images were marked for surgery.
IMPRESSION: Needle localization of the left breast. No apparent complications.

## 2017-03-23 ENCOUNTER — Ambulatory Visit
Admission: RE | Admit: 2017-03-23 | Discharge: 2017-03-23 | Disposition: A | Discharge status: Home / Self Care | Payer: Medicare Other | Source: Ambulatory Visit | Attending: Oncology | Admitting: Oncology

## 2017-03-23 DIAGNOSIS — M85852 Other specified disorders of bone density and structure, left thigh: Secondary | ICD-10-CM | POA: Insufficient documentation

## 2017-03-23 DIAGNOSIS — C50312 Malignant neoplasm of lower-inner quadrant of left female breast: Secondary | ICD-10-CM | POA: Insufficient documentation

## 2017-03-23 DIAGNOSIS — Z79899 Other long term (current) drug therapy: Secondary | ICD-10-CM | POA: Diagnosis present

## 2017-05-03 ENCOUNTER — Other Ambulatory Visit: Payer: Self-pay | Admitting: *Deleted

## 2017-05-03 MED ORDER — ANASTROZOLE 1 MG PO TABS: 1.0000 mg | ORAL_TABLET | Freq: Every day | ORAL | 1 refills | Status: DC

## 2017-05-03 NOTE — Addendum Note (Signed)
Addended by: Betti Cruz on: 05/03/2017 04:55 PM   Modules accepted: Orders

## 2017-06-06 NOTE — Progress Notes (Signed)
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  INTERVAL HISTORY: Patient returns to clinic today for routine six-month evaluation.  She currently feels well and is asymptomatic.  She continues to tolerate letrozole well without significant side effects.  She has no neurologic complaints. She denies any recent fevers or illnesses. She denies any pain. She has a good appetite and denies weight loss. She has no chest pain or shortness of breath. She has no nausea, vomiting, constipation, or diarrhea. She has no urinary complaints.  Patient feels at her baseline offers no specific complaints today.  REVIEW OF SYSTEMS:   Review of Systems  Constitutional: Negative.  Negative for fever, malaise/fatigue and weight loss.  Respiratory: Negative.  Negative for cough and shortness of breath.   Cardiovascular: Negative.  Negative for chest pain and leg swelling.  Gastrointestinal: Negative.  Negative for abdominal pain and constipation.  Genitourinary: Negative.   Musculoskeletal: Negative.  Negative for back pain.  Skin: Negative.  Negative for rash.  Neurological: Negative.  Negative for sensory change, focal weakness and weakness.  Psychiatric/Behavioral: Negative.  The patient is not nervous/anxious.    As per HPI. Otherwise, a complete review of systems is negative.   PAST MEDICAL HISTORY: Past Medical History:  Diagnosis Date  . Breast cancer ([place]) 11/2014   left  . Cancer [facility]) 2016   left breast  . Depression   . Dysrhythmia    heart racing  . Family history of adverse reaction to anesthesia    brother had difficulty going under anesthseia  . Headache    hx of migraines    period induced  . Hypertension   . Osteoarthritis   . Parkinson disease ([place])     PAST SURGICAL HISTORY: Past Surgical History:  Procedure Laterality Date  . BREAST BIOPSY Left 10/2014   positive  . BREAST EXCISIONAL BIOPSY Left 11/29/2014   invasive mam ca +  . DILATION AND CURETTAGE OF UTERUS    . Laminectomy Posterior Cervicle Decomp W/Facetectomy & Foraminotomy     [id number] x 3...Marland KitchenMarland KitchenEXCISION CENTRAL LEFT [place] HERNIATION L 2-3; Surgeon: Loni Dolly, MD; Location: [place]; Service: Orthopedics; Laterality: Left  . Laminectomy Posterior Lumbar Facetectomy & Foraminotomy W/Decomp     BILATERAL LUMBAR DECOMPRESSION L2-3, L3-4, L4-5; Surgeon: Loni Dolly, MD; Location: [place]; Service: Orthopedics; Laterality: Bilateral;  . PARTIAL MASTECTOMY WITH NEEDLE LOCALIZATION Left 11/29/2014   Procedure: PARTIAL MASTECTOMY WITH NEEDLE LOCALIZATION;  Surgeon: Leonie Green, MD;  Location: [facility];  Service: General;  Laterality: Left;  . SENTINEL NODE BIOPSY Left 11/29/2014   Procedure: SENTINEL NODE BIOPSY;  Surgeon: Leonie Green, MD;  Location: [facility];  Service: General;  Laterality: Left;    FAMILY HISTORY Family History  Problem Relation Age of Onset  . Breast cancer Maternal Aunt   . Breast cancer Paternal Aunt   . Coronary artery disease Mother   . Heart attack Mother   . Coronary artery disease Paternal Grandmother        ADVANCED DIRECTIVES:    HEALTH MAINTENANCE: Social History   Tobacco Use  . Smoking status: Former Smoker    Last attempt to quit: 11/22/1999    Years since quitting: 17.5  Substance  Use Topics  . Alcohol use: Yes    Alcohol/week: 8.4 oz    Types: 14 Glasses of wine per week  . Drug use: No     No Known Allergies  Current Outpatient Medications  Medication Sig Dispense Refill  . anastrozole (ARIMIDEX) 1 MG tablet Take 1 tablet (1 mg total) by mouth daily. 90 tablet 1  . Biotin 1000 MCG tablet Take by mouth.    .  carbidopa-levodopa (SINEMET IR) 10-100 MG tablet Take 1 tablet by mouth 3 (three) times daily.     . citalopram (CELEXA) 20 MG tablet TAKE ONE TABLET BY MOUTH DAILY. (DEPRESSION) *NO CHILDPROOF CAPS!* take evening    . Cyanocobalamin (RA VITAMIN B-12 TR) 1000 MCG TBCR Take 1,000 mcg by mouth every morning.     . fexofenadine (ALLEGRA) 180 MG tablet Take 180 mg by mouth daily.     Marland Kitchen ibuprofen (ADVIL,MOTRIN) 200 MG tablet Take 600 mg by mouth daily.     . meloxicam (MOBIC) 15 MG tablet Take by mouth.    . Misc Natural Products (BLACK CHERRY CONCENTRATE) LIQD Take by mouth.    Marland Kitchen oxymetazoline (AFRIN) 0.05 % nasal spray Place 1 spray into both nostrils at bedtime.     . potassium chloride (K-DUR) 10 MEQ tablet Take 10 mEq by mouth daily.     Marland Kitchen triamterene-hydrochlorothiazide (MAXZIDE-25) 37.5-25 MG tablet Take 1 tablet by mouth daily.     Marland Kitchen dextromethorphan (DELSYM) 30 MG/5ML liquid Take by mouth at bedtime as needed.     . diphenhydrAMINE (BENADRYL) 25 mg capsule Take 50 mg by mouth at bedtime.     Marland Kitchen HYDROcodone-acetaminophen (NORCO/VICODIN) 5-325 MG tablet Take by mouth.     No current facility-administered medications for this visit.     OBJECTIVE: Vitals:   06/10/17 1417  BP: (!) 131/95  Pulse: 79  Resp: 18  Temp: (!) 96.2 F (35.7 C)     Body mass index is 26.73 kg/m.    ECOG FS:0 - Asymptomatic  General: Well-developed, well-nourished, no acute distress. Eyes: Pink conjunctiva, anicteric sclera. Bilateral breast and axilla without lumps or masses. Lungs: Clear to auscultation bilaterally. Heart: Regular rate and rhythm. No rubs, murmurs, or gallops. Abdomen: Soft, nontender, nondistended. No organomegaly noted, normoactive bowel sounds. Musculoskeletal: No edema, cyanosis, or clubbing. Neuro: Alert, answering all questions appropriately. Cranial nerves grossly intact. Skin: No rashes or petechiae noted. Psych: Normal affect.  LAB RESULTS:  Lab Results  Component Value Date    NA 142 05/06/2011   K 3.6 05/06/2011   CL 105 05/06/2011   CO2 26 05/06/2011   GLUCOSE 85 05/06/2011   BUN 19 (H) 05/06/2011   CREATININE 0.96 05/06/2011   CALCIUM 9.3 05/06/2011   GFRNONAA >60 05/06/2011   GFRAA >60 05/06/2011    Lab Results  Component Value Date   WBC 4.8 01/11/2015   HGB 14.9 01/11/2015   HCT 44.3 01/11/2015   MCV 92.2 01/11/2015   PLT 211 01/11/2015     STUDIES: No results found.  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  PLAN:    1. 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 Oncotype was ordered, but was denied by insurance. Given the size of patient's tumor, she is likely low risk and she did not receive adjuvant chemotherapy.  Patient completed adjuvant XRT  and was given a prescription for letrozole which she will complete 5 years of treatment in January 2022.  Her most recent mammogram on October 30, 2016 was reported as BI-RADS 2, repeat in September 2019. Return to clinic in 6 months for further evaluation.  2. Osteopenia: Patient's most recent bone mineral density on March 23, 2017 reported T score of -1.4.  This is slightly worse than one year prior when her T score was -1.1.  No intervention is needed at this time.  Continue calcium and vitamin D supplementation.  Repeat in February 2020.  Approximately 20 minutes was spent in discussion of which greater than 50% was consultation.   Patient expressed understanding and was in agreement with this plan. She also understands that She can call clinic at any time with any questions, concerns, or complaints.   Breast cancer, left [facility])   Staging form: Breast, AJCC 7th Edition     Pathologic stage from 12/04/2014: Stage IA (T1b, N0, cM0) - Signed by Lloyd Huger, MD on  12/04/2014   Lloyd Huger, MD   06/11/2017 12:49 PM

## 2017-06-10 ENCOUNTER — Other Ambulatory Visit: Payer: Self-pay

## 2017-06-10 ENCOUNTER — Inpatient Hospital Stay: Payer: Medicare Other | Attending: Oncology | Admitting: Oncology

## 2017-06-10 VITALS — BP 131/95 | HR 79 | Temp 96.2°F | Resp 18 | Wt 173.2 lb

## 2017-06-10 DIAGNOSIS — Z79811 Long term (current) use of aromatase inhibitors: Secondary | ICD-10-CM | POA: Insufficient documentation

## 2017-06-10 DIAGNOSIS — Z87891 Personal history of nicotine dependence: Secondary | ICD-10-CM | POA: Insufficient documentation

## 2017-06-10 DIAGNOSIS — C50312 Malignant neoplasm of lower-inner quadrant of left female breast: Secondary | ICD-10-CM | POA: Diagnosis not present

## 2017-06-10 DIAGNOSIS — G20 Parkinson's disease: Secondary | ICD-10-CM | POA: Diagnosis not present

## 2017-06-10 DIAGNOSIS — I10 Essential (primary) hypertension: Secondary | ICD-10-CM | POA: Diagnosis not present

## 2017-06-10 DIAGNOSIS — Z79899 Other long term (current) drug therapy: Secondary | ICD-10-CM | POA: Diagnosis not present

## 2017-06-10 DIAGNOSIS — M1990 Unspecified osteoarthritis, unspecified site: Secondary | ICD-10-CM | POA: Insufficient documentation

## 2017-06-10 DIAGNOSIS — Z803 Family history of malignant neoplasm of breast: Secondary | ICD-10-CM | POA: Diagnosis not present

## 2017-06-10 DIAGNOSIS — Z170 Estrogen receptor positive status [ER+]: Secondary | ICD-10-CM | POA: Diagnosis not present

## 2017-06-10 NOTE — Progress Notes (Signed)
Here for follow up. Overall stated feeling fine.

## 2017-09-14 ENCOUNTER — Ambulatory Visit: Payer: Medicare Other | Attending: Neurology | Admitting: Physical Therapy

## 2017-09-14 ENCOUNTER — Encounter: Payer: Self-pay | Admitting: Physical Therapy

## 2017-09-14 DIAGNOSIS — M6281 Muscle weakness (generalized): Secondary | ICD-10-CM | POA: Insufficient documentation

## 2017-09-14 DIAGNOSIS — M5442 Lumbago with sciatica, left side: Secondary | ICD-10-CM | POA: Diagnosis present

## 2017-09-14 DIAGNOSIS — G8929 Other chronic pain: Secondary | ICD-10-CM | POA: Diagnosis present

## 2017-09-14 DIAGNOSIS — M5441 Lumbago with sciatica, right side: Secondary | ICD-10-CM | POA: Insufficient documentation

## 2017-09-14 NOTE — Therapy (Signed)
[place] [facility] [address], [place], [address] Phone: [phone]   Fax:  [phone]  Physical Therapy Treatment  Patient Details  Name: Kathy Bean MRN: [id number] Date of Birth: 29-Aug-1950 Referring Provider: Manuella Ghazi   Encounter Date: 09/14/2017  PT End of Session - 09/14/17 1619    Visit Number  1    Number of Visits  17    Date for PT Re-Evaluation  11/09/17    PT Start Time  0400    PT Stop Time  0500    PT Time Calculation (min)  60 min    Activity Tolerance  Patient tolerated treatment well    Behavior During Therapy  [facility] for tasks assessed/performed       Past Medical History:  Diagnosis Date  . Breast cancer ([place]) 11/2014   left  . Cancer [facility]) 2016   left breast  . Depression   . Dysrhythmia    heart racing  . Family history of adverse reaction to anesthesia    brother had difficulty going under anesthseia  . Headache    hx of migraines   period induced  . Hypertension   . Osteoarthritis   . Parkinson disease [facility])     Past Surgical History:  Procedure Laterality Date  . BREAST BIOPSY Left 10/2014   positive  . BREAST EXCISIONAL BIOPSY Left 11/29/2014   invasive mam ca +  . DILATION AND CURETTAGE OF UTERUS    . Laminectomy Posterior Cervicle Decomp W/Facetectomy & Foraminotomy     [id number] x 3...Marland KitchenMarland KitchenEXCISION CENTRAL LEFT [place] HERNIATION L 2-3; Surgeon: Loni Dolly, MD; Location: [place]; Service: Orthopedics; Laterality: Left  . Laminectomy Posterior Lumbar Facetectomy & Foraminotomy W/Decomp     BILATERAL LUMBAR DECOMPRESSION L2-3, L3-4, L4-5; Surgeon: Loni Dolly, MD; Location: [place]; Service: Orthopedics; Laterality: Bilateral;  . PARTIAL MASTECTOMY WITH NEEDLE LOCALIZATION Left 11/29/2014   Procedure: PARTIAL MASTECTOMY WITH NEEDLE LOCALIZATION;  Surgeon: Leonie Green, MD;  Location: [facility];  Service: General;  Laterality: Left;  . SENTINEL NODE BIOPSY Left  11/29/2014   Procedure: SENTINEL NODE BIOPSY;  Surgeon: Leonie Green, MD;  Location: [facility];  Service: General;  Laterality: Left;    There were no vitals filed for this visit.  Subjective Assessment - 09/14/17 1607    Subjective  LBP    Patient is accompained by:  Family member    Pertinent History  Patient is a 67 year old female with chronic hx of LBP with R > L sided sciatica. Patient reports recent exacerbation in June after going to the beach and walking in the sand. Patient reports after returning to the beach she had a cortisone shot. Patient reports she has had cortisone shots in the past with good pain relief. Patient reports she has had minimal R sided LBP after having coritsone shot. Patient reports she is beginning to experience some L side LBP now 1/10 at worst. Patient reports she is having some trouble with balance, but has not had any falls "in a long time". Pt denies N/V, unexplained weight fluctuation, saddle paresthesia, fever, night sweats, or unrelenting night pain at this time.    Limitations  Walking;Standing;Lifting;House hold activities    How long can you stand comfortably?  2 hours    How long can you walk comfortably?  2 hours    Patient Stated Goals  Clean her home, walking without pain    Currently in Pain?  Yes    Pain Score  1     Pain Location  Back    Pain Orientation  Left;Right;Lower;Posterior    Pain Descriptors / Indicators  Sharp    Pain Type  Chronic pain    Pain Radiating Towards  Patient reports R sided LBP with radiation down posterior RLE that has subsided since cortisone shot    Pain Onset  More than a month ago    Pain Frequency  Intermittent    Aggravating Factors   walking, bending forward to clean    Pain Relieving Factors  pain medication, cortisone shots    Effect of Pain on Daily Activities  Unable to complete house cleaning        ROM Lumbar flex: WNL with some tightness at bilat hamstrings Lumbar ext slightly  limited Lumbar rotation wnl bilat Lumbar lateral bending 25% limited to L with concordant sign of L LBP; wnl to R FADIR and hip flex + knee ext restricted by soft tissue in bilat hips; bilat hip IR restricted 25%  Strength Hip flex: 4+/5 bilat Hip abd L:4/5 bilat Hip add: 4+/5 bilat Hip Ext in prone: 3/5 bilat Hip IR: 4/5 bilat Hip ER: 4/5 bila5 Knee flex 5/5 bilat Knee ext 4+/5 bilat Ankle DF: 4+/5 bilat Ankle PF:  10 bilat heel raises; unable SL to complete rep with full ROM    Special Tests/ Other Reflexes 2+ bilat patella and achilles relflex (-) slump bilat (-) SLR (-) Crossed SLR (+) Elys on L for hip flexor tightness (+) 90/90 bilat (-) FABER bilat (+) FADIR bilat (+) Obers bilat (-) Scour bilat Pain with grade I mobilization to S1-5; noted tension at bilat glutes/piriformis 5xSTS 16sec SLS 3sec each LE BERG 48/56 6MWT 1220ft with decreased toe push off bilat with eversion compensation, and decreased trunk rotation   Ther-Ex -Seated piriformis stretch 30sec hold (HEP) - Seated hamstring stretch 30sec hold (HEP) - Bridge exercise x10 (HEP) - SLS at counter (HEP) -Education on PT role and there-ex role on pain and strengthening muscles surrounding the spine to increase stability and decrease pain  [facility] PT Assessment - 09/14/17 0001      Assessment   Medical Diagnosis  LBP; parkinsonism    Referring Provider  Manuella Ghazi    Onset Date/Surgical Date  07/15/17    Hand Dominance  Right    Next MD Visit  no    Prior Therapy  Yes      Home Environment   Living Environment  Private residence    Living Arrangements  Spouse/significant other    Available Help at Discharge  Family    Type of [place] to enter    Entrance Stairs-Number of Steps  4    Entrance Stairs-Rails  Right    Home Layout  Two level    Alternate Level Stairs-Rails  Left      Sensation   Light Touch  Appears Intact                                   PT Education - 09/14/17 1615    Education Details  Patient was educated on diagnosis, anatomy and pathology involved, prognosis, role of PT, and was given an HEP, demonstrating exercise with proper form following verbal and tactile cues, and was given a paper hand out to continue exercise at home. Pt was educated on  and agreed to plan of care.    Person(s) Educated  Patient    Methods  Explanation;Demonstration;Tactile cues;Verbal cues;Handout    Comprehension  Verbalized understanding;Returned demonstration;Verbal cues required;Tactile cues required       PT Short Term Goals - 09/14/17 1652      PT SHORT TERM GOAL #1   Title  Pt will be independent with HEP in order to improve strength and balance in order to decrease fall risk and improve function at home and work.    Time  4    Period  Weeks    Status  New        PT Long Term Goals - 09/14/17 1658      PT LONG TERM GOAL #1   Title  Pt will decrease 5TSTS by at least 3 seconds in order to demonstrate clinically significant improvement in LE strength    Baseline  09/14/17 16sec    Time  8    Period  Weeks    Status  New      PT LONG TERM GOAL #2   Title  Pt will improve BERG by at least 3 points in order to demonstrate clinically significant improvement in balance.    Baseline  09/14/17 48/56    Time  8    Period  Weeks    Status  New      PT LONG TERM GOAL #3   Title  Pt will increase 6MWT by at least 88m (114ft) in order to demonstrate clinically significant improvement in muscular endurance and community ambulation    Baseline  09/14/17 1236ft    Time  8    Period  Weeks    Status  New            Plan - 09/14/17 1703    Clinical Impression Statement  Patien tis a 67 year old female with hx of chronic LBP w/ R sciatica (R>L) with lumbar spine surgical hx, and hx of parkinsonism. Patient presenting with impairments in gait, LE strength, balance, and myofasical ROM restrictions.  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 Patient will benefit from skilled PT to address impairments to return patient to PLOF.     History and Personal Factors relevant to plan of care:  Posterior laminectomy, facetectomy, and foraminotomy w/ decompression L2-5, parkinsonism, HTN     Clinical Presentation  Evolving    Clinical Presentation due to:  2 personal factors/comorbidities, 3 body systems/activity limitations/participation restrictions     Clinical Decision Making  Moderate    Rehab Potential  Good    Clinical Impairments Affecting Rehab Potential  (-) age, other comorbidities, chronicity of pain (+) motivation, support system    PT Frequency  2x / week    PT Duration  8 weeks    PT Treatment/Interventions  Moist Heat;Traction;Cryotherapy;Therapeutic exercise;Taping;Patient/family education;Therapeutic activities;Functional mobility training;Manual techniques;Dry needling;Passive range of motion;Balance training;Gait training;Stair training;Aquatic Therapy    PT Next Visit Plan  FOTO; HEP and goal review    PT Home Exercise Plan  hamstring and piriformis stretch, bridge, SLS    Consulted and Agree with Plan of Care  Patient       Patient will benefit from skilled therapeutic intervention in order to improve the following deficits and impairments:  Abnormal gait, Increased fascial restricitons, Improper body mechanics, Pain, Postural dysfunction, Impaired tone, Decreased activity tolerance, Decreased endurance, Decreased range of motion, Decreased  strength, Decreased balance, Decreased safety awareness, Difficulty walking, Impaired flexibility  Visit Diagnosis: Chronic bilateral low back pain with bilateral sciatica     Problem List Patient Active Problem List   Diagnosis Date Noted  . Primary cancer of lower-inner quadrant of left female  breast ([place]) 12/04/2014   Shelton Silvas PT, DPT Shelton Silvas 09/14/2017, 5:11 PM  [facility] [address] [place], [address] Phone: [phone]   Fax:  [phone]  Name: SHARLIE SHREFFLER MRN: [id number] Date of Birth: 09-29-1950

## 2017-09-16 ENCOUNTER — Ambulatory Visit: Payer: Medicare Other | Admitting: Physical Therapy

## 2017-09-20 ENCOUNTER — Ambulatory Visit: Payer: Medicare Other | Admitting: Physical Therapy

## 2017-09-20 ENCOUNTER — Encounter: Payer: Self-pay | Admitting: Physical Therapy

## 2017-09-20 DIAGNOSIS — M5442 Lumbago with sciatica, left side: Secondary | ICD-10-CM | POA: Diagnosis not present

## 2017-09-20 DIAGNOSIS — M5441 Lumbago with sciatica, right side: Principal | ICD-10-CM

## 2017-09-20 DIAGNOSIS — G8929 Other chronic pain: Secondary | ICD-10-CM

## 2017-09-20 NOTE — Therapy (Signed)
[place] [facility] [address], [place], [address] Phone: [phone]   Fax:  [phone]  Physical Therapy Treatment  Patient Details  Name: Kathy Bean MRN: [id number] Date of Birth: 10-27-1950 Referring Provider: Manuella Ghazi   Encounter Date: 09/20/2017  PT End of Session - 09/20/17 1350    Visit Number  2    Number of Visits  17    Date for PT Re-Evaluation  11/09/17    PT Start Time  0145    PT Stop Time  0230    PT Time Calculation (min)  45 min    Activity Tolerance  Patient tolerated treatment well    Behavior During Therapy  [facility] for tasks assessed/performed       Past Medical History:  Diagnosis Date  . Breast cancer ([place]) 11/2014   left  . Cancer [facility]) 2016   left breast  . Depression   . Dysrhythmia    heart racing  . Family history of adverse reaction to anesthesia    brother had difficulty going under anesthseia  . Headache    hx of migraines   period induced  . Hypertension   . Osteoarthritis   . Parkinson disease [facility])     Past Surgical History:  Procedure Laterality Date  . BREAST BIOPSY Left 10/2014   positive  . BREAST EXCISIONAL BIOPSY Left 11/29/2014   invasive mam ca +  . DILATION AND CURETTAGE OF UTERUS    . Laminectomy Posterior Cervicle Decomp W/Facetectomy & Foraminotomy     [id number] x 3...Marland KitchenMarland KitchenEXCISION CENTRAL LEFT [place] HERNIATION L 2-3; Surgeon: Loni Dolly, MD; Location: [place]; Service: Orthopedics; Laterality: Left  . Laminectomy Posterior Lumbar Facetectomy & Foraminotomy W/Decomp     BILATERAL LUMBAR DECOMPRESSION L2-3, L3-4, L4-5; Surgeon: Loni Dolly, MD; Location: [place]; Service: Orthopedics; Laterality: Bilateral;  . PARTIAL MASTECTOMY WITH NEEDLE LOCALIZATION Left 11/29/2014   Procedure: PARTIAL MASTECTOMY WITH NEEDLE LOCALIZATION;  Surgeon: Leonie Green, MD;  Location: [facility];  Service: General;  Laterality: Left;  . SENTINEL NODE BIOPSY  Left 11/29/2014   Procedure: SENTINEL NODE BIOPSY;  Surgeon: Leonie Green, MD;  Location: [facility];  Service: General;  Laterality: Left;    There were no vitals filed for this visit.  Subjective Assessment - 09/20/17 1345    Subjective  Patient reports she is having some LBP in the am that subsides after 3mins of "getting moving". Patient reports minimal pain this afternoon. Patient reports compliance with her HEP with no questions or concerns.    Patient is accompained by:  Family member    Pertinent History  Patient is a 67 year old female with chronic hx of LBP with R > L sided sciatica. Patient reports recent exacerbation in June after going to the beach and walking in the sand. Patient reports after returning to the beach she had a cortisone shot. Patient reports she has had cortisone shots in the past with good pain relief. Patient reports she has had minimal R sided LBP after having coritsone shot. Patient reports she is beginning to experience some L side LBP now 1/10 at worst. Patient reports she is having some trouble with balance, but has not had any falls "in a long time". Pt denies N/V, unexplained weight fluctuation, saddle paresthesia, fever, night sweats, or unrelenting night pain at this time.    Limitations  Walking;Standing;Lifting;House hold activities    How long can you stand comfortably?  2 hours    How long can you walk comfortably?  2 hours    Patient Stated Goals  Clean her home, walking without pain    Pain Onset  More than a month ago       Ther-Ex - Nustep L2 75mins  - bridge x 10; bridge with red tband 2x 10 initially patient reports some back pain with this, following glute set and cuing to prevent "arch" and to contract glutes, patient is able to complete without pain.  - prone hip flexion with knee extended (attempted with knee flexion, painful to patient so d/c) 3x 8 each LE with cuing for glute contraction and eccentric control with patient exhibiting  difficulty R>L - Mini squat from elevated mat table 3x 10 with TC/VC to prevent knee valgus  - TA push down 15# 3x 10 with min cuing for neutral posture - Palloff press red tband 2x 10 with visual target cuing                       PT Education - 09/20/17 1401    Education Details  Exercise form    Person(s) Educated  Patient    Methods  Explanation;Tactile cues;Verbal cues    Comprehension  Verbalized understanding;Returned demonstration;Verbal cues required;Tactile cues required       PT Short Term Goals - 09/14/17 1652      PT SHORT TERM GOAL #1   Title  Pt will be independent with HEP in order to improve strength and balance in order to decrease fall risk and improve function at home and work.    Time  4    Period  Weeks    Status  New        PT Long Term Goals - 09/20/17 1434      PT LONG TERM GOAL #1   Title  Pt will decrease 5TSTS by at least 3 seconds in order to demonstrate clinically significant improvement in LE strength    Baseline  09/14/17 16sec    Time  8    Period  Weeks    Status  New      PT LONG TERM GOAL #2   Title  Pt will improve BERG by at least 3 points in order to demonstrate clinically significant improvement in balance.    Baseline  09/14/17 48/56    Time  8    Period  Weeks    Status  New      PT LONG TERM GOAL #3   Title  Pt will increase 6MWT by at least 94m (122ft) in order to demonstrate clinically significant improvement in muscular endurance and community ambulation    Baseline  09/14/17 1225ft    Time  8    Period  Weeks    Status  New      PT LONG TERM GOAL #4   Title   Patient will increase FOTO score to 60 to demonstrate predicted increase in functional mobility to complete ADLs    Baseline  09/20/17: 53    Time  8    Period  Weeks    Status  New            Plan - 09/20/17 1427    Clinical Impression Statement  PT led patient through therex with patient requiring visual, verbal, and tactile cuing to  activate core and glute musculature correctly for exercise sequencing. Patient reports no pain through therex and is able to complete exercises  accurately following PT cuing.     Rehab Potential  Good    Clinical Impairments Affecting Rehab Potential  (-) age, other comorbidities, chronicity of pain (+) motivation, support system    PT Frequency  2x / week    PT Duration  8 weeks    PT Treatment/Interventions  Moist Heat;Traction;Cryotherapy;Therapeutic exercise;Taping;Patient/family education;Therapeutic activities;Functional mobility training;Manual techniques;Dry needling;Passive range of motion;Balance training;Gait training;Stair training;Aquatic Therapy    PT Next Visit Plan  Hip and core strengthening    PT Home Exercise Plan  hamstring and piriformis stretch, bridge, SLS    Consulted and Agree with Plan of Care  Patient       Patient will benefit from skilled therapeutic intervention in order to improve the following deficits and impairments:  Abnormal gait, Increased fascial restricitons, Improper body mechanics, Pain, Postural dysfunction, Impaired tone, Decreased activity tolerance, Decreased endurance, Decreased range of motion, Decreased strength, Decreased balance, Decreased safety awareness, Difficulty walking, Impaired flexibility  Visit Diagnosis: Chronic bilateral low back pain with bilateral sciatica     Problem List Patient Active Problem List   Diagnosis Date Noted  . Primary cancer of lower-inner quadrant of left female breast ([place]) 12/04/2014   Shelton Silvas PT, DPT Shelton Silvas 09/20/2017, 2:36 PM  [facility] [address], [place], [address] Phone: [phone]   Fax:  [phone]  Name: TANISHA LUTES MRN: [id number] Date of Birth: 1950-04-28

## 2017-09-22 ENCOUNTER — Ambulatory Visit: Payer: Medicare Other | Admitting: Physical Therapy

## 2017-09-22 ENCOUNTER — Encounter: Payer: Self-pay | Admitting: Physical Therapy

## 2017-09-22 DIAGNOSIS — G8929 Other chronic pain: Secondary | ICD-10-CM

## 2017-09-22 DIAGNOSIS — M5442 Lumbago with sciatica, left side: Secondary | ICD-10-CM | POA: Diagnosis not present

## 2017-09-22 DIAGNOSIS — M5441 Lumbago with sciatica, right side: Principal | ICD-10-CM

## 2017-09-22 NOTE — Therapy (Signed)
[place] [facility] [address], [place], [address] Phone: [phone]   Fax:  [phone]  Physical Therapy Treatment  Patient Details  Name: Kathy Bean MRN: [id number] Date of Birth: 12/31/1950 Referring Provider: Manuella Ghazi   Encounter Date: 09/22/2017  PT End of Session - 09/22/17 1404    Visit Number  3    Number of Visits  17    Date for PT Re-Evaluation  11/09/17    PT Start Time  0145    PT Stop Time  0230    PT Time Calculation (min)  45 min    Activity Tolerance  Patient tolerated treatment well    Behavior During Therapy  [facility] for tasks assessed/performed       Past Medical History:  Diagnosis Date  . Breast cancer ([place]) 11/2014   left  . Cancer [facility]) 2016   left breast  . Depression   . Dysrhythmia    heart racing  . Family history of adverse reaction to anesthesia    brother had difficulty going under anesthseia  . Headache    hx of migraines   period induced  . Hypertension   . Osteoarthritis   . Parkinson disease [facility])     Past Surgical History:  Procedure Laterality Date  . BREAST BIOPSY Left 10/2014   positive  . BREAST EXCISIONAL BIOPSY Left 11/29/2014   invasive mam ca +  . DILATION AND CURETTAGE OF UTERUS    . Laminectomy Posterior Cervicle Decomp W/Facetectomy & Foraminotomy     [id number] x 3...Marland KitchenMarland KitchenEXCISION CENTRAL LEFT [place] HERNIATION L 2-3; Surgeon: Loni Dolly, MD; Location: [place]; Service: Orthopedics; Laterality: Left  . Laminectomy Posterior Lumbar Facetectomy & Foraminotomy W/Decomp     BILATERAL LUMBAR DECOMPRESSION L2-3, L3-4, L4-5; Surgeon: Loni Dolly, MD; Location: [place]; Service: Orthopedics; Laterality: Bilateral;  . PARTIAL MASTECTOMY WITH NEEDLE LOCALIZATION Left 11/29/2014   Procedure: PARTIAL MASTECTOMY WITH NEEDLE LOCALIZATION;  Surgeon: Leonie Green, MD;  Location: [facility];  Service: General;  Laterality: Left;  . SENTINEL NODE BIOPSY  Left 11/29/2014   Procedure: SENTINEL NODE BIOPSY;  Surgeon: Leonie Green, MD;  Location: [facility];  Service: General;  Laterality: Left;    There were no vitals filed for this visit.  Subjective Assessment - 09/22/17 1359    Subjective  Patient reports 4/10 LBP today. Patient reports she was "uncomfortable" following last session, but cannot verbalize if it was muscle soreness, pain, or fatigue. Patient reports it may be because "she has not worked out in a while". Patient reports compliance with her HEP with no questions or concerns.     Patient is accompained by:  Family member    Pertinent History  Patient is a 67 year old female with chronic hx of LBP with R > L sided sciatica. Patient reports recent exacerbation in June after going to the beach and walking in the sand. Patient reports after returning to the beach she had a cortisone shot. Patient reports she has had cortisone shots in the past with good pain relief. Patient reports she has had minimal R sided LBP after having coritsone shot. Patient reports she is beginning to experience some L side LBP now 1/10 at worst. Patient reports she is having some trouble with balance, but has not had any falls "in a long time". Pt denies N/V, unexplained weight fluctuation, saddle paresthesia, fever, night sweats, or unrelenting night pain at this time.  Limitations  Walking;Standing;Lifting;House hold activities    How long can you stand comfortably?  2 hours    How long can you walk comfortably?  2 hours    Patient Stated Goals  Clean her home, walking without pain    Pain Onset  More than a month ago           Ther-Ex - Nustep L2 70mins L3 2 mins to increase quad demand - bridge with red tband 3x 10 with cuing for eccentric control; HEP review with printout given as patient could not recall at home this progression - Side stepping with red tband 40ft (10 down 10 back) x3 with CGA and cuing for eccentric control  - Step Ups with CL  LE tap on ground to promote SL hip ext 3x 10each LE - TA push down 15# 3x 10 with cuing for eccentric control with 3sec coutdown                       PT Education - 09/22/17 4696    Education Details  Exericse form    Person(s) Educated  Patient    Methods  Explanation;Verbal cues;Handout    Comprehension  Verbalized understanding;Verbal cues required       PT Short Term Goals - 09/14/17 1652      PT SHORT TERM GOAL #1   Title  Pt will be independent with HEP in order to improve strength and balance in order to decrease fall risk and improve function at home and work.    Time  4    Period  Weeks    Status  New        PT Long Term Goals - 09/20/17 1434      PT LONG TERM GOAL #1   Title  Pt will decrease 5TSTS by at least 3 seconds in order to demonstrate clinically significant improvement in LE strength    Baseline  09/14/17 16sec    Time  8    Period  Weeks    Status  New      PT LONG TERM GOAL #2   Title  Pt will improve BERG by at least 3 points in order to demonstrate clinically significant improvement in balance.    Baseline  09/14/17 48/56    Time  8    Period  Weeks    Status  New      PT LONG TERM GOAL #3   Title  Pt will increase 6MWT by at least 56m (16ft) in order to demonstrate clinically significant improvement in muscular endurance and community ambulation    Baseline  09/14/17 1263ft    Time  8    Period  Weeks    Status  New      PT LONG TERM GOAL #4   Title   Patient will increase FOTO score to 60 to demonstrate predicted increase in functional mobility to complete ADLs    Baseline  09/20/17: 53    Time  8    Period  Weeks    Status  New            Plan - 09/22/17 1425    Clinical Impression Statement  PT continued to progress therex LE and core strengthening which patient was able to complete with accuracy following PT cuing, requiring some cuing throughout to maintain proper form. Patient reports no pain through therex only  muscle soreness.     Rehab Potential  Good  Clinical Impairments Affecting Rehab Potential  (-) age, other comorbidities, chronicity of pain (+) motivation, support system    PT Frequency  2x / week    PT Duration  8 weeks    PT Treatment/Interventions  Moist Heat;Traction;Cryotherapy;Therapeutic exercise;Taping;Patient/family education;Therapeutic activities;Functional mobility training;Manual techniques;Dry needling;Passive range of motion;Balance training;Gait training;Stair training;Aquatic Therapy    PT Next Visit Plan  Hip and core strengthening    PT Home Exercise Plan  hamstring and piriformis stretch, bridge, SLS    Consulted and Agree with Plan of Care  Patient       Patient will benefit from skilled therapeutic intervention in order to improve the following deficits and impairments:  Abnormal gait, Increased fascial restricitons, Improper body mechanics, Pain, Postural dysfunction, Impaired tone, Decreased activity tolerance, Decreased endurance, Decreased range of motion, Decreased strength, Decreased balance, Decreased safety awareness, Difficulty walking, Impaired flexibility  Visit Diagnosis: Chronic bilateral low back pain with bilateral sciatica     Problem List Patient Active Problem List   Diagnosis Date Noted  . Primary cancer of lower-inner quadrant of left female breast ([employer]) 12/04/2014   Shelton Silvas PT, DPT Shelton Silvas 09/22/2017, 2:46 PM  [place] [facility] [address] [place], [address] Phone: [phone]   Fax:  [phone]  Name: Kathy Bean MRN: [id number] Date of Birth: 09/10/1950

## 2017-09-27 ENCOUNTER — Encounter: Payer: Self-pay | Admitting: Physical Therapy

## 2017-09-27 ENCOUNTER — Ambulatory Visit: Payer: Medicare Other | Admitting: Physical Therapy

## 2017-09-27 DIAGNOSIS — G8929 Other chronic pain: Secondary | ICD-10-CM

## 2017-09-27 DIAGNOSIS — M5442 Lumbago with sciatica, left side: Secondary | ICD-10-CM | POA: Diagnosis not present

## 2017-09-27 DIAGNOSIS — M5441 Lumbago with sciatica, right side: Principal | ICD-10-CM

## 2017-09-27 NOTE — Therapy (Signed)
[place] [facility] [address] [place], [address] Phone: [phone]   Fax:  [phone]  Physical Therapy Treatment  Patient Details  Name: ANISHA STARLIPER MRN: [id number] Date of Birth: [birth year] Referring Provider: Manuella Ghazi   Encounter Date: 09/27/2017  PT End of Session - 09/27/17 1546    Visit Number  4    Number of Visits  17    Date for PT Re-Evaluation  11/09/17    PT Start Time  0315    PT Stop Time  0400    PT Time Calculation (min)  45 min    Activity Tolerance  Patient tolerated treatment well    Behavior During Therapy  [facility] for tasks assessed/performed       Past Medical History:  Diagnosis Date  . Breast cancer ([place]) 11/2014   left  . Cancer [facility]) 2016   left breast  . Depression   . Dysrhythmia    heart racing  . Family history of adverse reaction to anesthesia    brother had difficulty going under anesthseia  . Headache    hx of migraines   period induced  . Hypertension   . Osteoarthritis   . Parkinson disease [facility])     Past Surgical History:  Procedure Laterality Date  . BREAST BIOPSY Left 10/2014   positive  . BREAST EXCISIONAL BIOPSY Left 11/29/2014   invasive mam ca +  . DILATION AND CURETTAGE OF UTERUS    . Laminectomy Posterior Cervicle Decomp W/Facetectomy & Foraminotomy     [id number] x 3...Marland KitchenMarland KitchenEXCISION CENTRAL LEFT [place] HERNIATION L 2-3; Surgeon: Loni Dolly, MD; Location: [place]; Service: Orthopedics; Laterality: Left  . Laminectomy Posterior Lumbar Facetectomy & Foraminotomy W/Decomp     BILATERAL LUMBAR DECOMPRESSION L2-3, L3-4, L4-5; Surgeon: Loni Dolly, MD; Location: [place]; Service: Orthopedics; Laterality: Bilateral;  . PARTIAL MASTECTOMY WITH NEEDLE LOCALIZATION Left 11/29/2014   Procedure: PARTIAL MASTECTOMY WITH NEEDLE LOCALIZATION;  Surgeon: Leonie Green, MD;  Location: [facility];  Service: General;  Laterality: Left;  . SENTINEL NODE BIOPSY  Left 11/29/2014   Procedure: SENTINEL NODE BIOPSY;  Surgeon: Leonie Green, MD;  Location: [facility];  Service: General;  Laterality: Left;    There were no vitals filed for this visit.  Subjective Assessment - 09/27/17 1517    Subjective  Patient reports some upper back pain following picking up a crock pot yesterday that she feels like she just "pinched it". Patient reports no LBP today. Reports no LOB episodes. Patient reports compliance with her HEP with no questions or concerns.     Pertinent History  Patient is a 67 year old female with chronic hx of LBP with R > L sided sciatica. Patient reports recent exacerbation in June after going to the beach and walking in the sand. Patient reports after returning to the beach she had a cortisone shot. Patient reports she has had cortisone shots in the past with good pain relief. Patient reports she has had minimal R sided LBP after having coritsone shot. Patient reports she is beginning to experience some L side LBP now 1/10 at worst. Patient reports she is having some trouble with balance, but has not had any falls "in a long time". Pt denies N/V, unexplained weight fluctuation, saddle paresthesia, fever, night sweats, or unrelenting night pain at this time.    Limitations  Walking;Standing;Lifting;House hold activities    How long can you stand comfortably?  2 hours  How long can you walk comfortably?  2 hours    Patient Stated Goals  Clean her home, walking without pain    Pain Onset  More than a month ago            Ther-Ex - Nustep L3 7 mins to increase quad demand - bridge from dina disc 3x 10 with cuing needed for full glute contraction - Side stepping over 10in hurdle 3x 10(each direction) with cuing for slow, control movement - Mini squat 3x 10 at treadmill bar with 2 finger support on the bar for balance with demo and max cuing initially for proper form with good carry over following - TA push down 15# 3x 10 with cuing for  eccentric control  - Palloff press 3x 10 with red tband with visual target for proper form and min cuing for hip/shoulder alignment                   PT Education - 09/27/17 1520    Education Details  Exercise form    Person(s) Educated  Patient    Methods  Explanation;Demonstration;Verbal cues    Comprehension  Verbalized understanding;Verbal cues required;Returned demonstration       PT Short Term Goals - 09/14/17 1652      PT SHORT TERM GOAL #1   Title  Pt will be independent with HEP in order to improve strength and balance in order to decrease fall risk and improve function at home and work.    Time  4    Period  Weeks    Status  New        PT Long Term Goals - 09/20/17 1434      PT LONG TERM GOAL #1   Title  Pt will decrease 5TSTS by at least 3 seconds in order to demonstrate clinically significant improvement in LE strength    Baseline  09/14/17 16sec    Time  8    Period  Weeks    Status  New      PT LONG TERM GOAL #2   Title  Pt will improve BERG by at least 3 points in order to demonstrate clinically significant improvement in balance.    Baseline  09/14/17 48/56    Time  8    Period  Weeks    Status  New      PT LONG TERM GOAL #3   Title  Pt will increase 6MWT by at least 110m (110ft) in order to demonstrate clinically significant improvement in muscular endurance and community ambulation    Baseline  09/14/17 1282ft    Time  8    Period  Weeks    Status  New      PT LONG TERM GOAL #4   Title   Patient will increase FOTO score to 60 to demonstrate predicted increase in functional mobility to complete ADLs    Baseline  09/20/17: 53    Time  8    Period  Weeks    Status  New            Plan - 09/27/17 1550    Clinical Impression Statement  PT continued to progress therex for increased hip ext/core and balance demand. Pt was able to complete all therex with min cuing from PT for proper form.     Rehab Potential  Good    Clinical  Impairments Affecting Rehab Potential  (-) age, other comorbidities, chronicity of pain (+) motivation, support system  PT Frequency  2x / week    PT Duration  8 weeks    PT Treatment/Interventions  Moist Heat;Traction;Cryotherapy;Therapeutic exercise;Taping;Patient/family education;Therapeutic activities;Functional mobility training;Manual techniques;Dry needling;Passive range of motion;Balance training;Gait training;Stair training;Aquatic Therapy    PT Next Visit Plan  Hip and core strengthening    PT Home Exercise Plan  hamstring and piriformis stretch, bridge, SLS    Consulted and Agree with Plan of Care  Patient       Patient will benefit from skilled therapeutic intervention in order to improve the following deficits and impairments:  Abnormal gait, Increased fascial restricitons, Improper body mechanics, Pain, Postural dysfunction, Impaired tone, Decreased activity tolerance, Decreased endurance, Decreased range of motion, Decreased strength, Decreased balance, Decreased safety awareness, Difficulty walking, Impaired flexibility  Visit Diagnosis: Chronic bilateral low back pain with bilateral sciatica     Problem List Patient Active Problem List   Diagnosis Date Noted  . Primary cancer of lower-inner quadrant of left female breast ([place]) 12/04/2014   Shelton Silvas PT, DPT Shelton Silvas 09/27/2017, 4:01 PM  [place] [facility] [address] [place], [address] Phone: [phone]   Fax:  [phone]  Name: VICY MEDICO MRN: [id number] Date of Birth: [birth year]

## 2017-09-29 ENCOUNTER — Encounter: Payer: Self-pay | Admitting: Physical Therapy

## 2017-09-29 ENCOUNTER — Ambulatory Visit: Payer: Medicare Other | Admitting: Physical Therapy

## 2017-09-29 DIAGNOSIS — G8929 Other chronic pain: Secondary | ICD-10-CM

## 2017-09-29 DIAGNOSIS — M5442 Lumbago with sciatica, left side: Secondary | ICD-10-CM | POA: Diagnosis not present

## 2017-09-29 DIAGNOSIS — M6281 Muscle weakness (generalized): Secondary | ICD-10-CM

## 2017-09-29 DIAGNOSIS — M5441 Lumbago with sciatica, right side: Principal | ICD-10-CM

## 2017-09-29 NOTE — Therapy (Signed)
[place] [facility] [address], [place], [address] Phone: [phone]   Fax:  [phone]  Physical Therapy Treatment  Patient Details  Name: Kathy Bean MRN: [id number] Date of Birth: 01/09/1951 Referring Provider: Manuella Ghazi   Encounter Date: 09/29/2017  PT End of Session - 09/29/17 1306    Visit Number  5    Number of Visits  17    Date for PT Re-Evaluation  11/09/17    PT Start Time  0104    PT Stop Time  0142    PT Time Calculation (min)  38 min    Activity Tolerance  Patient tolerated treatment well    Behavior During Therapy  [facility] for tasks assessed/performed       Past Medical History:  Diagnosis Date  . Breast cancer ([place]) 11/2014   left  . Cancer [facility]) 2016   left breast  . Depression   . Dysrhythmia    heart racing  . Family history of adverse reaction to anesthesia    brother had difficulty going under anesthseia  . Headache    hx of migraines   period induced  . Hypertension   . Osteoarthritis   . Parkinson disease [facility])     Past Surgical History:  Procedure Laterality Date  . BREAST BIOPSY Left 10/2014   positive  . BREAST EXCISIONAL BIOPSY Left 11/29/2014   invasive mam ca +  . DILATION AND CURETTAGE OF UTERUS    . Laminectomy Posterior Cervicle Decomp W/Facetectomy & Foraminotomy     [id number] x 3...Marland KitchenMarland KitchenEXCISION CENTRAL LEFT [place] HERNIATION L 2-3; Surgeon: Loni Dolly, MD; Location: [place]; Service: Orthopedics; Laterality: Left  . Laminectomy Posterior Lumbar Facetectomy & Foraminotomy W/Decomp     BILATERAL LUMBAR DECOMPRESSION L2-3, L3-4, L4-5; Surgeon: Loni Dolly, MD; Location: [place]; Service: Orthopedics; Laterality: Bilateral;  . PARTIAL MASTECTOMY WITH NEEDLE LOCALIZATION Left 11/29/2014   Procedure: PARTIAL MASTECTOMY WITH NEEDLE LOCALIZATION;  Surgeon: Leonie Green, MD;  Location: [facility];  Service: General;  Laterality: Left;  . SENTINEL NODE BIOPSY  Left 11/29/2014   Procedure: SENTINEL NODE BIOPSY;  Surgeon: Leonie Green, MD;  Location: [facility];  Service: General;  Laterality: Left;    There were no vitals filed for this visit.  Subjective Assessment - 09/29/17 1305    Subjective  Patient reports no back pain today. Patient reports compliance with her HEP with no questions/concerns at this time.     Pertinent History  Patient is a 67 year old female with chronic hx of LBP with R > L sided sciatica. Patient reports recent exacerbation in June after going to the beach and walking in the sand. Patient reports after returning to the beach she had a cortisone shot. Patient reports she has had cortisone shots in the past with good pain relief. Patient reports she has had minimal R sided LBP after having coritsone shot. Patient reports she is beginning to experience some L side LBP now 1/10 at worst. Patient reports she is having some trouble with balance, but has not had any falls "in a long time". Pt denies N/V, unexplained weight fluctuation, saddle paresthesia, fever, night sweats, or unrelenting night pain at this time.    Limitations  Walking;Standing;Lifting;House hold activities    How long can you stand comfortably?  2 hours    How long can you walk comfortably?  2 hours    Patient Stated Goals  Clean her home, walking  without pain    Pain Onset  More than a month ago       Ther-Ex - Nustep L3 29mins; L4 47mins to increase quad demand - Mini squat at treadmill bar 3x 10 with demo and max cuing for proper form (wt back on heels, prevention of knee valgus) initially with good carry over following - Mini split squat 3x 10 with max cuing during first set for proper form with good carry over following first set -  MATRIX hip abd 25# 3x 10 with consistent cuing for eccentric control throughout reps. Patient reports exercise is "hard but doable"  - Attempted theraball oblique twists; patient is unable to activate TA to safely hold  herself on theraball - Theraball pelvic circles both directions over several trials for patient to grasp concept of pelvic movement with ant/post tilt - Theraball post pelvic tilt over multiple trials with demo for patient to accurately activate core muscles to produce post pelvic tilt                       PT Education - 09/29/17 1306    Education Details  Exercise form    Person(s) Educated  Patient    Methods  Explanation;Demonstration;Verbal cues    Comprehension  Verbal cues required;Returned demonstration;Verbalized understanding       PT Short Term Goals - 09/14/17 1652      PT SHORT TERM GOAL #1   Title  Pt will be independent with HEP in order to improve strength and balance in order to decrease fall risk and improve function at home and work.    Time  4    Period  Weeks    Status  New        PT Long Term Goals - 09/20/17 1434      PT LONG TERM GOAL #1   Title  Pt will decrease 5TSTS by at least 3 seconds in order to demonstrate clinically significant improvement in LE strength    Baseline  09/14/17 16sec    Time  8    Period  Weeks    Status  New      PT LONG TERM GOAL #2   Title  Pt will improve BERG by at least 3 points in order to demonstrate clinically significant improvement in balance.    Baseline  09/14/17 48/56    Time  8    Period  Weeks    Status  New      PT LONG TERM GOAL #3   Title  Pt will increase 6MWT by at least 23m (174ft) in order to demonstrate clinically significant improvement in muscular endurance and community ambulation    Baseline  09/14/17 1282ft    Time  8    Period  Weeks    Status  New      PT LONG TERM GOAL #4   Title   Patient will increase FOTO score to 60 to demonstrate predicted increase in functional mobility to complete ADLs    Baseline  09/20/17: 53    Time  8    Period  Weeks    Status  New            Plan - 09/29/17 1308    Clinical Impression Statement  PT continued to progress therex with  increased compound/functional movement training. Patient requires max cuing for core activation and pelvic motion, but is able to complete eventually with cuing. PT will continue to focus on  addressing strength with funcitonal movements, and incorporating core stability/motor control.    Rehab Potential  Good    Clinical Impairments Affecting Rehab Potential  (-) age, other comorbidities, chronicity of pain (+) motivation, support system    PT Frequency  2x / week    PT Duration  8 weeks    PT Treatment/Interventions  Moist Heat;Traction;Cryotherapy;Therapeutic exercise;Taping;Patient/family education;Therapeutic activities;Functional mobility training;Manual techniques;Dry needling;Passive range of motion;Balance training;Gait training;Stair training;Aquatic Therapy    PT Next Visit Plan  Hip and core strengthening    PT Home Exercise Plan  hamstring and piriformis stretch, bridge, SLS    Consulted and Agree with Plan of Care  Patient       Patient will benefit from skilled therapeutic intervention in order to improve the following deficits and impairments:  Abnormal gait, Increased fascial restricitons, Improper body mechanics, Pain, Postural dysfunction, Impaired tone, Decreased activity tolerance, Decreased endurance, Decreased range of motion, Decreased strength, Decreased balance, Decreased safety awareness, Difficulty walking, Impaired flexibility  Visit Diagnosis: Chronic bilateral low back pain with bilateral sciatica  Muscle weakness (generalized)     Problem List Patient Active Problem List   Diagnosis Date Noted  . Primary cancer of lower-inner quadrant of left female breast ([place]) 12/04/2014   Shelton Silvas PT, DPT Shelton Silvas 09/29/2017, 1:57 PM  [place] [facility] [address] [place], [address] Phone: [phone]   Fax:  [phone]  Name: Kathy Bean MRN: [id number] Date of Birth:  1951-01-14

## 2017-10-04 ENCOUNTER — Ambulatory Visit: Payer: Medicare Other | Admitting: Physical Therapy

## 2017-10-04 ENCOUNTER — Encounter: Payer: Self-pay | Admitting: Physical Therapy

## 2017-10-04 DIAGNOSIS — M5441 Lumbago with sciatica, right side: Principal | ICD-10-CM

## 2017-10-04 DIAGNOSIS — M5442 Lumbago with sciatica, left side: Secondary | ICD-10-CM | POA: Diagnosis not present

## 2017-10-04 DIAGNOSIS — G8929 Other chronic pain: Secondary | ICD-10-CM

## 2017-10-04 NOTE — Therapy (Signed)
[place] [address], [place], [address] Phone: [phone]   Fax:  [phone]  Physical Therapy Treatment  Patient Details  Name: Kathy Bean MRN: [id number] Date of Birth: 08-02-1950 Referring Provider: Manuella Ghazi   Encounter Date: 10/04/2017  PT End of Session - 10/04/17 1524    Visit Number  6    Number of Visits  17    Date for PT Re-Evaluation  11/09/17    PT Start Time  0319    PT Stop Time  0400    PT Time Calculation (min)  41 min    Activity Tolerance  Patient tolerated treatment well    Behavior During Therapy  [facility] for tasks assessed/performed       Past Medical History:  Diagnosis Date  . Breast cancer ([place]) 11/2014   left  . Cancer [facility]) 2016   left breast  . Depression   . Dysrhythmia    heart racing  . Family history of adverse reaction to anesthesia    brother had difficulty going under anesthseia  . Headache    hx of migraines   period induced  . Hypertension   . Osteoarthritis   . Parkinson disease [facility])     Past Surgical History:  Procedure Laterality Date  . BREAST BIOPSY Left 10/2014   positive  . BREAST EXCISIONAL BIOPSY Left 11/29/2014   invasive mam ca +  . DILATION AND CURETTAGE OF UTERUS    . Laminectomy Posterior Cervicle Decomp W/Facetectomy & Foraminotomy     [id number] x 3...Marland KitchenMarland KitchenEXCISION CENTRAL LEFT [place] HERNIATION L 2-3; Surgeon: Loni Dolly, MD; Location: [place]; Service: Orthopedics; Laterality: Left  . Laminectomy Posterior Lumbar Facetectomy & Foraminotomy W/Decomp     BILATERAL LUMBAR DECOMPRESSION L2-3, L3-4, L4-5; Surgeon: Loni Dolly, MD; Location: [place]; Service: Orthopedics; Laterality: Bilateral;  . PARTIAL MASTECTOMY WITH NEEDLE LOCALIZATION Left 11/29/2014   Procedure: PARTIAL MASTECTOMY WITH NEEDLE LOCALIZATION;  Surgeon: Leonie Green, MD;  Location: [facility];  Service: General;  Laterality: Left;  . SENTINEL NODE BIOPSY  Left 11/29/2014   Procedure: SENTINEL NODE BIOPSY;  Surgeon: Leonie Green, MD;  Location: [facility];  Service: General;  Laterality: Left;    There were no vitals filed for this visit.  Subjective Assessment - 10/04/17 1522    Subjective  Patient reports she had some pain with bending over this morning, that has subsided. Patient reports compliance with HEP with no questions or concerns.     Patient is accompained by:  Family member    Pertinent History  Patient is a 67 year old female with chronic hx of LBP with R > L sided sciatica. Patient reports recent exacerbation in June after going to the beach and walking in the sand. Patient reports after returning to the beach she had a cortisone shot. Patient reports she has had cortisone shots in the past with good pain relief. Patient reports she has had minimal R sided LBP after having coritsone shot. Patient reports she is beginning to experience some L side LBP now 1/10 at worst. Patient reports she is having some trouble with balance, but has not had any falls "in a long time". Pt denies N/V, unexplained weight fluctuation, saddle paresthesia, fever, night sweats, or unrelenting night pain at this time.    Limitations  Walking;Standing;Lifting;House hold activities    How long can you stand comfortably?  2 hours    How long can you  walk comfortably?  2 hours    Patient Stated Goals  Clean her home, walking without pain    Pain Onset  More than a month ago         Ther-Ex - Nustep L4 21mins;  to increase quad demand - Mini squat at treadmill bar 3x 10 with demo and max cuing for proper form (wt back on heels, prevention of knee valgus) initially with good carry over following - TA pushdowns 3x 10 15# with good carry over from previous sessions - Posterior pelvic tilt 2x 10 with PT utilizing TC for feedback to flatten LB - Attempted carry over of neutral pelvis posture in sitting on theraball with patient able to demonstrate proper  posture following several trials, but reports discomfort with this.  - Education utilizing spine model on length tension relationship of lumbar extensors and core musculature and the importance of core strengthening to maintain pelvic neutral -  MATRIX hip abd 30# 3x 10 with consistent cuing for eccentric control throughout reps. Patient reports exercise is "hard but doable"                          PT Education - 10/04/17 1525    Education Details  Exercise form; squat mechanics for bending/stooping    Person(s) Educated  Patient    Methods  Explanation;Demonstration;Verbal cues    Comprehension  Verbalized understanding;Returned demonstration;Verbal cues required       PT Short Term Goals - 09/14/17 1652      PT SHORT TERM GOAL #1   Title  Pt will be independent with HEP in order to improve strength and balance in order to decrease fall risk and improve function at home and work.    Time  4    Period  Weeks    Status  New        PT Long Term Goals - 09/20/17 1434      PT LONG TERM GOAL #1   Title  Pt will decrease 5TSTS by at least 3 seconds in order to demonstrate clinically significant improvement in LE strength    Baseline  09/14/17 16sec    Time  8    Period  Weeks    Status  New      PT LONG TERM GOAL #2   Title  Pt will improve BERG by at least 3 points in order to demonstrate clinically significant improvement in balance.    Baseline  09/14/17 48/56    Time  8    Period  Weeks    Status  New      PT LONG TERM GOAL #3   Title  Pt will increase 6MWT by at least 73m (132ft) in order to demonstrate clinically significant improvement in muscular endurance and community ambulation    Baseline  09/14/17 1249ft    Time  8    Period  Weeks    Status  New      PT LONG TERM GOAL #4   Title   Patient will increase FOTO score to 60 to demonstrate predicted increase in functional mobility to complete ADLs    Baseline  09/20/17: 53    Time  8    Period   Weeks    Status  New            Plan - 10/04/17 1604    Clinical Impression Statement  PT continued to progress therex and work toward patient understanding of  pelvic neutral and the importance of this to reduce abnomral length/tension relationship, and pain. Patient was able to complete all therex with accuracy following PT cuing and verbalized understanding of all provided education.     Rehab Potential  Good    Clinical Impairments Affecting Rehab Potential  (-) age, other comorbidities, chronicity of pain (+) motivation, support system    PT Frequency  2x / week    PT Duration  8 weeks    PT Treatment/Interventions  Moist Heat;Traction;Cryotherapy;Therapeutic exercise;Taping;Patient/family education;Therapeutic activities;Functional mobility training;Manual techniques;Dry needling;Passive range of motion;Balance training;Gait training;Stair training;Aquatic Therapy    PT Next Visit Plan  Hip and core strengthening    PT Home Exercise Plan  hamstring and piriformis stretch, bridge, SLS    Consulted and Agree with Plan of Care  Patient       Patient will benefit from skilled therapeutic intervention in order to improve the following deficits and impairments:  Abnormal gait, Increased fascial restricitons, Improper body mechanics, Pain, Postural dysfunction, Impaired tone, Decreased activity tolerance, Decreased endurance, Decreased range of motion, Decreased strength, Decreased balance, Decreased safety awareness, Difficulty walking, Impaired flexibility  Visit Diagnosis: Chronic bilateral low back pain with bilateral sciatica     Problem List Patient Active Problem List   Diagnosis Date Noted  . Primary cancer of lower-inner quadrant of left female breast ([place]) 12/04/2014   Shelton Silvas PT, DPT Shelton Silvas 10/04/2017, 4:59 PM  [place] [facility] [address], [place], [address] Phone: [phone]   Fax:   [phone]  Name: Kathy Bean MRN: [id number] Date of Birth: 18-Oct-1950

## 2017-10-06 ENCOUNTER — Ambulatory Visit: Payer: Medicare Other | Admitting: Physical Therapy

## 2017-10-12 ENCOUNTER — Encounter: Payer: Medicare Other | Admitting: Physical Therapy

## 2017-10-14 ENCOUNTER — Encounter: Payer: Medicare Other | Admitting: Physical Therapy

## 2017-10-19 ENCOUNTER — Encounter: Payer: Medicare Other | Admitting: Physical Therapy

## 2017-10-21 ENCOUNTER — Encounter: Payer: Medicare Other | Admitting: Physical Therapy

## 2017-10-26 ENCOUNTER — Encounter: Payer: Medicare Other | Admitting: Physical Therapy

## 2017-10-28 ENCOUNTER — Encounter: Payer: Medicare Other | Admitting: Physical Therapy

## 2017-11-01 ENCOUNTER — Ambulatory Visit
Admission: RE | Admit: 2017-11-01 | Discharge: 2017-11-01 | Disposition: A | Discharge status: Home / Self Care | Payer: Medicare Other | Source: Ambulatory Visit | Attending: Oncology | Admitting: Oncology

## 2017-11-01 DIAGNOSIS — C50312 Malignant neoplasm of lower-inner quadrant of left female breast: Secondary | ICD-10-CM | POA: Diagnosis present

## 2017-11-02 ENCOUNTER — Other Ambulatory Visit: Payer: Self-pay | Admitting: *Deleted

## 2017-11-02 ENCOUNTER — Encounter: Payer: Medicare Other | Admitting: Physical Therapy

## 2017-11-02 MED ORDER — ANASTROZOLE 1 MG PO TABS: 1.0000 mg | ORAL_TABLET | Freq: Every day | ORAL | 1 refills | Status: DC

## 2017-11-04 ENCOUNTER — Encounter: Payer: Medicare Other | Admitting: Physical Therapy

## 2017-12-21 ENCOUNTER — Ambulatory Visit: Payer: Medicare Other | Admitting: Oncology

## 2017-12-25 NOTE — Progress Notes (Signed)
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  INTERVAL HISTORY: Patient returns to clinic today for routine six-month evaluation.  She continues to tolerate letrozole well with only occasional hot flashes that do not affect her day-to-day activity.  She currently feels well and is asymptomatic.  She has no neurologic complaints. She denies any recent fevers or illnesses. She denies any pain. She has a good appetite and denies weight loss. She has no chest pain or shortness of breath. She has no nausea, vomiting, constipation, or diarrhea. She has no urinary complaints.  Patient feels at her baseline offers no specific complaints today.  REVIEW OF SYSTEMS:   Review of Systems  Constitutional: Negative.  Negative for fever, malaise/fatigue and weight loss.  Respiratory: Negative.  Negative for cough and shortness of breath.   Cardiovascular: Negative.  Negative for chest pain and leg swelling.  Gastrointestinal: Negative.  Negative for abdominal pain and constipation.  Genitourinary: Negative.  Negative for dysuria.  Musculoskeletal: Negative.  Negative for back pain.  Skin: Negative.  Negative for rash.  Neurological: Positive for sensory change. Negative for focal weakness, weakness and headaches.  Psychiatric/Behavioral: Negative.  The patient is not nervous/anxious.    As per HPI. Otherwise, a complete review of systems is negative.   PAST MEDICAL HISTORY: Past Medical History:  Diagnosis Date  . Breast cancer ([place]) 11/2014   left  . Cancer [facility]) 2016   left breast  . Depression   . Dysrhythmia    heart racing  . Family history of adverse reaction to anesthesia    brother had difficulty going under anesthseia  . Headache    hx of migraines   period induced  . Hypertension   . Osteoarthritis   . Parkinson disease ([place])     PAST SURGICAL HISTORY: Past Surgical History:  Procedure Laterality Date  . BREAST BIOPSY Left 10/2014   positive  . BREAST EXCISIONAL BIOPSY Left 11/29/2014   invasive mam ca +  . DILATION AND CURETTAGE OF UTERUS    . Laminectomy Posterior Cervicle Decomp W/Facetectomy & Foraminotomy     [id number] x 3...Marland KitchenMarland KitchenEXCISION CENTRAL LEFT [place] HERNIATION L 2-3; Surgeon: Loni Dolly, MD; Location: [place]; Service: Orthopedics; Laterality: Left  . Laminectomy Posterior Lumbar Facetectomy & Foraminotomy W/Decomp     BILATERAL LUMBAR DECOMPRESSION L2-3, L3-4, L4-5; Surgeon: Loni Dolly, MD; Location: [place]; Service: Orthopedics; Laterality: Bilateral;  . PARTIAL MASTECTOMY WITH NEEDLE LOCALIZATION Left 11/29/2014   Procedure: PARTIAL MASTECTOMY WITH NEEDLE LOCALIZATION;  Surgeon: Leonie Green, MD;  Location: [facility];  Service: General;  Laterality: Left;  . SENTINEL NODE BIOPSY Left 11/29/2014   Procedure: SENTINEL NODE BIOPSY;  Surgeon: Leonie Green, MD;  Location: [facility];  Service: General;  Laterality: Left;    FAMILY HISTORY Family History  Problem Relation Age of Onset  . Breast cancer Maternal Aunt   . Breast cancer Paternal Aunt   . Coronary artery disease Mother   . Heart attack Mother   . Coronary artery disease Paternal Grandmother        ADVANCED DIRECTIVES:    HEALTH MAINTENANCE: Social History   Tobacco Use  . Smoking status: Former Smoker    Last attempt to  quit: 11/22/1999    Years since quitting: 18.1  Substance Use Topics  . Alcohol use: Yes    Alcohol/week: 14.0 standard drinks    Types: 14 Glasses of wine per week  . Drug use: No     No Known Allergies  Current Outpatient Medications  Medication Sig Dispense Refill  . anastrozole (ARIMIDEX) 1 MG tablet Take 1 tablet  (1 mg total) by mouth daily. 90 tablet 1  . Biotin 1000 MCG tablet Take by mouth.    . Calcium Carbonate-Vitamin D 600-400 MG-UNIT tablet Take by mouth.    . carbidopa-levodopa (SINEMET IR) 10-100 MG tablet Take 1 tablet by mouth 3 (three) times daily.     . citalopram (CELEXA) 20 MG tablet TAKE ONE TABLET BY MOUTH DAILY. (DEPRESSION) *NO CHILDPROOF CAPS!* take evening    . Cyanocobalamin (RA VITAMIN B-12 TR) 1000 MCG TBCR Take 1,000 mcg by mouth every morning.     . fexofenadine (ALLEGRA) 180 MG tablet Take 180 mg by mouth daily.     . meloxicam (MOBIC) 15 MG tablet Take by mouth.    . Misc Natural Products (BLACK CHERRY CONCENTRATE) LIQD Take by mouth.    Marland Kitchen oxymetazoline (AFRIN) 0.05 % nasal spray Place 1 spray into both nostrils at bedtime.     . potassium chloride (K-DUR) 10 MEQ tablet Take 10 mEq by mouth daily.     Marland Kitchen dextromethorphan (DELSYM) 30 MG/5ML liquid Take by mouth at bedtime as needed.     . diphenhydrAMINE (BENADRYL) 25 mg capsule Take 50 mg by mouth at bedtime.     Marland Kitchen HYDROcodone-acetaminophen (NORCO/VICODIN) 5-325 MG tablet Take by mouth.    Marland Kitchen ibuprofen (ADVIL,MOTRIN) 200 MG tablet Take 600 mg by mouth daily.     Marland Kitchen triamterene-hydrochlorothiazide (MAXZIDE-25) 37.5-25 MG tablet Take 1 tablet by mouth daily.      No current facility-administered medications for this visit.     OBJECTIVE: Vitals:   12/28/17 1017  BP: 140/89  Pulse: (!) 108  Resp: 18  Temp: (!) 96.6 F (35.9 C)     Body mass index is 27.33 kg/m.    ECOG FS:0 - Asymptomatic  General: Well-developed, well-nourished, no acute distress. Eyes: Pink conjunctiva, anicteric sclera. HEENT: Normocephalic, moist mucous membranes. Breast: Bilateral breast and axilla without lumps or masses. Lungs: Clear to auscultation bilaterally. Heart: Regular rate and rhythm. No rubs, murmurs, or gallops. Abdomen: Soft, nontender, nondistended. No organomegaly noted, normoactive bowel sounds. Musculoskeletal: No edema,  cyanosis, or clubbing. Neuro: Alert, answering all questions appropriately. Cranial nerves grossly intact. Skin: No rashes or petechiae noted. Psych: Normal affect.  LAB RESULTS:  Lab Results  Component Value Date   NA 142 05/06/2011   K 3.6 05/06/2011   CL 105 05/06/2011   CO2 26 05/06/2011   GLUCOSE 85 05/06/2011   BUN 19 (H) 05/06/2011   CREATININE 0.96 05/06/2011   CALCIUM 9.3 05/06/2011   GFRNONAA >60 05/06/2011   GFRAA >60 05/06/2011    Lab Results  Component Value Date   WBC 4.8 01/11/2015   HGB 14.9 01/11/2015   HCT 44.3 01/11/2015   MCV 92.2 01/11/2015   PLT 211 01/11/2015     STUDIES: No results found.  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  PLAN:    1. 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  Oncotype was ordered, but was denied by insurance. Given the size of patient's tumor, she is likely low risk and she did not receive adjuvant chemotherapy.  Patient completed adjuvant XRT.  Continue letrozole for a total of 5 years completing treatment in January 2022.  Her most recent mammogram on November 01, 2017 was reported as BI-RADS 2.  Repeat in September 2020.  Return to clinic in 6 months for routine evaluation.  2. Osteopenia: Patient's most recent bone mineral density on March 23, 2017 reported T score of -1.4.  This is slightly worse than one year prior when her T score was -1.1.  No intervention is needed at this time.  Continue calcium and vitamin D supplementation.  Repeat in February 2020.   Patient expressed understanding and was in agreement with this plan. She also understands that She can call clinic at any time with any questions, concerns, or complaints.   Breast cancer, left [facility])   Staging form: Breast, AJCC 7th Edition     Pathologic  stage from 12/04/2014: Stage IA (T1b, N0, cM0) - Signed by Lloyd Huger, MD on 12/04/2014   Lloyd Huger, MD   12/28/2017 10:57 AM

## 2017-12-28 ENCOUNTER — Other Ambulatory Visit: Payer: Self-pay

## 2017-12-28 ENCOUNTER — Inpatient Hospital Stay: Payer: Medicare Other | Attending: Oncology | Admitting: Oncology

## 2017-12-28 VITALS — BP 140/89 | HR 108 | Temp 96.6°F | Resp 18 | Wt 177.1 lb

## 2017-12-28 DIAGNOSIS — Z87891 Personal history of nicotine dependence: Secondary | ICD-10-CM | POA: Diagnosis not present

## 2017-12-28 DIAGNOSIS — C50312 Malignant neoplasm of lower-inner quadrant of left female breast: Secondary | ICD-10-CM | POA: Insufficient documentation

## 2017-12-28 DIAGNOSIS — R232 Flushing: Secondary | ICD-10-CM | POA: Insufficient documentation

## 2017-12-28 DIAGNOSIS — I10 Essential (primary) hypertension: Secondary | ICD-10-CM | POA: Diagnosis not present

## 2017-12-28 DIAGNOSIS — Z79899 Other long term (current) drug therapy: Secondary | ICD-10-CM | POA: Insufficient documentation

## 2017-12-28 DIAGNOSIS — M8580 Other specified disorders of bone density and structure, unspecified site: Secondary | ICD-10-CM | POA: Insufficient documentation

## 2017-12-28 DIAGNOSIS — Z803 Family history of malignant neoplasm of breast: Secondary | ICD-10-CM | POA: Diagnosis not present

## 2017-12-28 DIAGNOSIS — Z170 Estrogen receptor positive status [ER+]: Secondary | ICD-10-CM | POA: Insufficient documentation

## 2017-12-28 DIAGNOSIS — G20 Parkinson's disease: Secondary | ICD-10-CM | POA: Diagnosis not present

## 2017-12-28 DIAGNOSIS — M1990 Unspecified osteoarthritis, unspecified site: Secondary | ICD-10-CM | POA: Insufficient documentation

## 2017-12-28 DIAGNOSIS — G620 Drug-induced polyneuropathy: Secondary | ICD-10-CM

## 2017-12-28 DIAGNOSIS — Z79811 Long term (current) use of aromatase inhibitors: Secondary | ICD-10-CM | POA: Insufficient documentation

## 2017-12-28 DIAGNOSIS — Z791 Long term (current) use of non-steroidal anti-inflammatories (NSAID): Secondary | ICD-10-CM | POA: Diagnosis not present

## 2017-12-28 NOTE — Progress Notes (Signed)
Here for follow up. Per pt " doing fine"

## 2018-03-24 ENCOUNTER — Ambulatory Visit
Admission: RE | Admit: 2018-03-24 | Discharge: 2018-03-24 | Disposition: A | Discharge status: Home / Self Care | Payer: Medicare Other | Source: Ambulatory Visit | Attending: Oncology | Admitting: Oncology

## 2018-03-24 DIAGNOSIS — M8589 Other specified disorders of bone density and structure, multiple sites: Secondary | ICD-10-CM | POA: Diagnosis not present

## 2018-03-24 DIAGNOSIS — C50312 Malignant neoplasm of lower-inner quadrant of left female breast: Secondary | ICD-10-CM | POA: Diagnosis present

## 2018-04-18 DIAGNOSIS — G20A1 Parkinson's disease without dyskinesia, without mention of fluctuations: Secondary | ICD-10-CM | POA: Insufficient documentation

## 2018-04-18 DIAGNOSIS — G20 Parkinson's disease: Secondary | ICD-10-CM | POA: Insufficient documentation

## 2018-04-24 ENCOUNTER — Other Ambulatory Visit: Payer: Self-pay | Admitting: Oncology

## 2018-05-16 ENCOUNTER — Other Ambulatory Visit: Payer: Self-pay | Admitting: Internal Medicine

## 2018-05-16 ENCOUNTER — Ambulatory Visit: Payer: Medicare Other

## 2018-05-16 DIAGNOSIS — M5440 Lumbago with sciatica, unspecified side: Secondary | ICD-10-CM

## 2018-05-17 ENCOUNTER — Other Ambulatory Visit: Payer: Self-pay

## 2018-05-17 ENCOUNTER — Ambulatory Visit
Admission: RE | Admit: 2018-05-17 | Discharge: 2018-05-17 | Disposition: A | Discharge status: Home / Self Care | Payer: Medicare Other | Source: Ambulatory Visit | Attending: Internal Medicine | Admitting: Internal Medicine

## 2018-05-17 DIAGNOSIS — M5440 Lumbago with sciatica, unspecified side: Secondary | ICD-10-CM | POA: Diagnosis present

## 2018-05-18 ENCOUNTER — Ambulatory Visit: Payer: Medicare Other

## 2018-05-23 ENCOUNTER — Ambulatory Visit: Payer: Medicare Other

## 2018-05-25 ENCOUNTER — Ambulatory Visit: Payer: Medicare Other

## 2018-05-30 ENCOUNTER — Ambulatory Visit: Payer: Medicare Other

## 2018-06-01 ENCOUNTER — Ambulatory Visit: Payer: Medicare Other

## 2018-06-06 ENCOUNTER — Ambulatory Visit: Payer: Medicare Other

## 2018-06-08 ENCOUNTER — Ambulatory Visit: Payer: Medicare Other

## 2018-06-13 ENCOUNTER — Ambulatory Visit: Payer: Medicare Other

## 2018-06-15 ENCOUNTER — Ambulatory Visit: Payer: Medicare Other

## 2018-06-25 ENCOUNTER — Emergency Department: Payer: Medicare Other

## 2018-06-25 ENCOUNTER — Other Ambulatory Visit: Payer: Self-pay

## 2018-06-25 ENCOUNTER — Encounter: Payer: Self-pay | Admitting: Emergency Medicine

## 2018-06-25 ENCOUNTER — Emergency Department
Admission: EM | Admit: 2018-06-25 | Discharge: 2018-06-25 | Disposition: A | Discharge status: Home / Self Care | Payer: Medicare Other | Attending: Emergency Medicine | Admitting: Emergency Medicine

## 2018-06-25 DIAGNOSIS — I10 Essential (primary) hypertension: Secondary | ICD-10-CM | POA: Insufficient documentation

## 2018-06-25 DIAGNOSIS — R110 Nausea: Secondary | ICD-10-CM | POA: Insufficient documentation

## 2018-06-25 DIAGNOSIS — Z87891 Personal history of nicotine dependence: Secondary | ICD-10-CM | POA: Insufficient documentation

## 2018-06-25 DIAGNOSIS — R109 Unspecified abdominal pain: Secondary | ICD-10-CM

## 2018-06-25 DIAGNOSIS — Z853 Personal history of malignant neoplasm of breast: Secondary | ICD-10-CM | POA: Insufficient documentation

## 2018-06-25 DIAGNOSIS — R1010 Upper abdominal pain, unspecified: Secondary | ICD-10-CM | POA: Insufficient documentation

## 2018-06-25 LAB — URINALYSIS, COMPLETE (UACMP) WITH MICROSCOPIC
Bacteria, UA: NONE SEEN
Bilirubin Urine: NEGATIVE
Glucose, UA: NEGATIVE mg/dL
Hgb urine dipstick: NEGATIVE
Ketones, ur: NEGATIVE mg/dL
Leukocytes,Ua: NEGATIVE
Nitrite: NEGATIVE
Protein, ur: NEGATIVE mg/dL
Specific Gravity, Urine: 1.031 — ABNORMAL HIGH (ref 1.005–1.030)
Squamous Epithelial / LPF: NONE SEEN (ref 0–5)
pH: 7 (ref 5.0–8.0)

## 2018-06-25 LAB — CBC WITH DIFFERENTIAL/PLATELET
Abs Immature Granulocytes: 0.08 10*3/uL — ABNORMAL HIGH (ref 0.00–0.07)
Basophils Absolute: 0 10*3/uL (ref 0.0–0.1)
Basophils Relative: 0 %
Eosinophils Absolute: 0 10*3/uL (ref 0.0–0.5)
Eosinophils Relative: 0 %
HCT: 44 % (ref 36.0–46.0)
Hemoglobin: 14.7 g/dL (ref 12.0–15.0)
Immature Granulocytes: 1 %
Lymphocytes Relative: 18 %
Lymphs Abs: 1.5 10*3/uL (ref 0.7–4.0)
MCH: 32.3 pg (ref 26.0–34.0)
MCHC: 33.4 g/dL (ref 30.0–36.0)
MCV: 96.7 fL (ref 80.0–100.0)
Monocytes Absolute: 0.9 10*3/uL (ref 0.1–1.0)
Monocytes Relative: 10 %
Neutro Abs: 5.9 10*3/uL (ref 1.7–7.7)
Neutrophils Relative %: 71 %
Platelets: 261 10*3/uL (ref 150–400)
RBC: 4.55 MIL/uL (ref 3.87–5.11)
RDW: 11.7 % (ref 11.5–15.5)
WBC: 8.5 10*3/uL (ref 4.0–10.5)
nRBC: 0 % (ref 0.0–0.2)

## 2018-06-25 LAB — COMPREHENSIVE METABOLIC PANEL
ALT: <5 U/L (ref 0–44)
AST: 18 U/L (ref 15–41)
Albumin: 4.4 g/dL (ref 3.5–5.0)
Alkaline Phosphatase: 57 U/L (ref 38–126)
Anion gap: 15 (ref 5–15)
BUN: 26 mg/dL — ABNORMAL HIGH (ref 8–23)
CO2: 24 mmol/L (ref 22–32)
Calcium: 10.2 mg/dL (ref 8.9–10.3)
Chloride: 100 mmol/L (ref 98–111)
Creatinine, Ser: 1.37 mg/dL — ABNORMAL HIGH (ref 0.44–1.00)
GFR calc Af Amer: 46 mL/min — ABNORMAL LOW (ref >60)
GFR calc non Af Amer: 40 mL/min — ABNORMAL LOW (ref >60)
Glucose, Bld: 134 mg/dL — ABNORMAL HIGH (ref 70–99)
Potassium: 3.7 mmol/L (ref 3.5–5.1)
Sodium: 139 mmol/L (ref 135–145)
Total Bilirubin: 1.1 mg/dL (ref 0.3–1.2)
Total Protein: 7.1 g/dL (ref 6.5–8.1)

## 2018-06-25 LAB — LIPASE, BLOOD: Lipase: 41 U/L (ref 11–51)

## 2018-06-25 MED ORDER — IOHEXOL 240 MG/ML SOLN
50.0000 mL | Freq: Once | INTRAMUSCULAR | Status: AC
  Administered 2018-06-25: 50 mL via ORAL

## 2018-06-25 MED ORDER — ONDANSETRON HCL 4 MG/2ML IJ SOLN
4.0000 mg | Freq: Once | INTRAMUSCULAR | Status: AC
  Administered 2018-06-25: 4 mg via INTRAVENOUS
  Filled 2018-06-25: qty 2

## 2018-06-25 MED ORDER — IOHEXOL 300 MG/ML  SOLN
75.0000 mL | Freq: Once | INTRAMUSCULAR | Status: AC | PRN
  Administered 2018-06-25: 15:00:00 75 mL via INTRAVENOUS

## 2018-06-25 NOTE — ED Triage Notes (Signed)
Pt to ED via POV c/o abdominal pain and constipation for about 2 weeks. Pt states that she had a x-ray done on Tuesday, no obstruction was seen. Pt has used OTC medication without relief. Pt is in NAD.

## 2018-06-25 NOTE — ED Provider Notes (Signed)
[facility] Emergency Department Provider Note ____________________________________________   First MD Initiated Contact with Patient 06/25/18 1204     (approximate)  I have reviewed the triage vital signs and the nursing notes.   HISTORY  Chief Complaint Abdominal Pain and Constipation    HPI Kathy Bean is a 68 y.o. female with PMH as noted below who presents with abdominal discomfort, described mainly as tightness but with some pain as well, primarily in the upper abdomen, and gradual onset over the last 2 weeks.  It is associated with constipation although she has had a few small liquid bowel movements.  She reports associated nausea but no vomiting.  She has no chest pain, cough, shortness of breath, or fever.  She saw her doctor last week and had an outpatient x-ray which she was told showed no obstruction.  She reports that it feels "warm" when she urinates but denies other urinary symptoms.   Past Medical History:  Diagnosis Date  . Breast cancer ([place]) 11/2014   left  . Cancer [facility]) 2016   left breast  . Depression   . Dysrhythmia    heart racing  . Family history of adverse reaction to anesthesia    brother had difficulty going under anesthseia  . Headache    hx of migraines   period induced  . Hypertension   . Osteoarthritis   . Parkinson disease [facility])     Patient Active Problem List   Diagnosis Date Noted  . Primary cancer of lower-inner quadrant of left female breast ([place]) 12/04/2014    Past Surgical History:  Procedure Laterality Date  . BREAST BIOPSY Left 10/2014   positive  . BREAST EXCISIONAL BIOPSY Left 11/29/2014   invasive mam ca +  . DILATION AND CURETTAGE OF UTERUS    . Laminectomy Posterior Cervicle Decomp W/Facetectomy & Foraminotomy     [id number] x 3...Marland KitchenMarland KitchenEXCISION CENTRAL LEFT [place] HERNIATION L 2-3; Surgeon: Loni Dolly, MD; Location: [place]; Service: Orthopedics; Laterality: Left  . Laminectomy Posterior  Lumbar Facetectomy & Foraminotomy W/Decomp     BILATERAL LUMBAR DECOMPRESSION L2-3, L3-4, L4-5; Surgeon: Loni Dolly, MD; Location: [place]; Service: Orthopedics; Laterality: Bilateral;  . PARTIAL MASTECTOMY WITH NEEDLE LOCALIZATION Left 11/29/2014   Procedure: PARTIAL MASTECTOMY WITH NEEDLE LOCALIZATION;  Surgeon: Leonie Green, MD;  Location: [facility] ORS;  Service: General;  Laterality: Left;  . SENTINEL NODE BIOPSY Left 11/29/2014   Procedure: SENTINEL NODE BIOPSY;  Surgeon: Leonie Green, MD;  Location: [facility] OR[facility];  Service: General;  Laterality: Left;    Prior to Admission medications   Medication Sig Start Date End Date Taking? Authorizing Provider  anastrozole (ARIMIDEX) 1 MG tablet TAKE 1 TABLET BY MOUTH EVERY DAY Patient taking differently: Take 1 mg by mouth daily.  04/24/18  Yes Lloyd Huger, MD  Biotin 1000 MCG tablet Take 1,000 mcg by mouth daily.    Yes [provider]  Calcium Carbonate-Vitamin D 600-400 MG-UNIT tablet Take 1 tablet by mouth daily.    Yes [provider]  carbidopa-levodopa (SINEMET CR) 50-200 MG tablet Take 1 tablet by mouth at bedtime. 06/19/18  Yes [provider]  carbidopa-levodopa (SINEMET IR) 25-100 MG tablet Take 2 tablets by mouth 3 (three) times daily.    Yes [provider]  citalopram (CELEXA) 20 MG tablet Take 10 mg by mouth daily.    Yes [provider]  Cyanocobalamin (RA VITAMIN B-12 TR) 1000 MCG TBCR Take 1,000 mcg by  mouth daily.    Yes [provider]  diphenhydrAMINE (BENADRYL) 25 mg capsule Take 25 mg by mouth at bedtime.    Yes [provider]  fexofenadine (ALLEGRA) 180 MG tablet Take 180 mg by mouth daily.    Yes [provider]  ibuprofen (ADVIL,MOTRIN) 200 MG tablet Take 600 mg by mouth 3 (three) times daily.    Yes [provider]  potassium chloride (K-DUR) 10 MEQ tablet Take 10 mEq by mouth 2 (two) times daily.    Yes [provider]  triamterene-hydrochlorothiazide (MAXZIDE-25) 37.5-25 MG tablet Take 2 tablets by mouth daily.    Yes [provider]  vitamin E 400 UNIT capsule Take 400 Units by mouth daily.   Yes [provider]    Allergies Patient has no known allergies.  Family History  Problem Relation Age of Onset  . Breast cancer Maternal Aunt   . Breast cancer Paternal Aunt   . Coronary artery disease Mother   . Heart attack Mother   . Coronary artery disease Paternal Grandmother     Social History Social History   Tobacco Use  . Smoking status: Former Smoker    Last attempt to quit: 11/22/1999    Years since quitting: 18.6  . Smokeless tobacco: Never Used  Substance Use Topics  . Alcohol use: Yes    Alcohol/week: 14.0 standard drinks    Types: 14 Glasses of wine per week  . Drug use: No    Review of Systems  Constitutional: No fever. Eyes: No redness. ENT: No sore throat. Cardiovascular: Denies chest pain. Respiratory: Denies shortness of breath. Gastrointestinal: Positive for nausea. Genitourinary: Positive for mild dysuria. Musculoskeletal: Negative for back pain. Skin: Negative for rash. Neurological: Negative for headache.   ____________________________________________   PHYSICAL EXAM:  VITAL SIGNS: ED Triage Vitals  Enc Vitals Group     BP 06/25/18 1156 119/83     Pulse Rate 06/25/18 1156 99     Resp 06/25/18 1156 16     Temp 06/25/18 1159 97.6 F (36.4 C)     Temp Source 06/25/18 1156 Oral     SpO2 06/25/18 1156 100 %     Weight 06/25/18 1159 180 lb (81.6 kg)     Height 06/25/18 1159 5\' 7"  (1.702 m)     Head Circumference --      Peak Flow --      Pain Score 06/25/18 1159 4     Pain Loc --      Pain Edu? --      Excl. in [place]? --     Constitutional: Alert and oriented.  Uncomfortable but not acutely ill-appearing. Eyes: Conjunctivae are normal.  No scleral icterus. Head: Atraumatic. Nose: No congestion/rhinnorhea. Mouth/Throat:  Mucous membranes are slightly dry.   Neck: Normal range of motion.  Cardiovascular: Normal rate, regular rhythm. Good peripheral circulation. Respiratory: Normal respiratory effort.  No retractions.  Gastrointestinal: Soft with mild distention but no focal tenderness. Genitourinary: No flank tenderness. Musculoskeletal: No lower extremity edema.  Extremities warm and well perfused.  Neurologic:  Normal speech and language. No gross focal neurologic deficits are appreciated.  Skin:  Skin is warm and dry. No rash noted. Psychiatric: Mood and affect are normal. Speech and behavior are normal.  ____________________________________________   LABS (all labs ordered are listed, but only abnormal results are displayed)  Labs Reviewed  COMPREHENSIVE METABOLIC PANEL - Abnormal; Notable for the following components:      Result Value  Glucose, Bld 134 (*)    BUN 26 (*)    Creatinine, Ser 1.37 (*)    GFR calc non Af Amer 40 (*)    GFR calc Af Amer 46 (*)    All other components within normal limits  CBC WITH DIFFERENTIAL/PLATELET - Abnormal; Notable for the following components:   Abs Immature Granulocytes 0.08 (*)    All other components within normal limits  LIPASE, BLOOD  URINALYSIS, COMPLETE (UACMP) WITH MICROSCOPIC   ____________________________________________  EKG  ED ECG REPORT I, Arta Silence, the attending physician, personally viewed and interpreted this ECG.  Date: 06/25/2018 EKG Time: 1322 Rate: 96 Rhythm: normal sinus rhythm QRS Axis: normal Intervals: LAFB ST/T Wave abnormalities: Nonspecific inferior T wave abnormalities Narrative Interpretation: no evidence of acute ischemia  ____________________________________________  RADIOLOGY  CT abdomen: Pending  ____________________________________________   PROCEDURES  Procedure(s) performed: No  Procedures  Critical Care performed: No ____________________________________________   INITIAL  IMPRESSION / ASSESSMENT AND PLAN / ED COURSE  Pertinent labs & imaging results that were available during my care of the patient were reviewed by me and considered in my medical decision making (see chart for details).  68 year old female with PMH as noted above presents with primarily upper abdominal discomfort and distention over the last 2 weeks associated with nausea and with constipation.  The patient had an outpatient x-ray few days ago.  I reviewed the past medical records in epic and care everywhere.  I was able to see that this x-ray was done but do not see a result.  On exam, the patient is uncomfortable but not acutely ill-appearing.  She does have a slightly distended abdomen but no focal tenderness.  The remainder of the exam is as described above.  Differential includes constipation, gastritis, PUD, hepatobiliary etiology, colitis, or less likely SBO or volvulus.  We will obtain lab work-up, CT abdomen, give symptomatic treatment with Zofran and reassess.  ----------------------------------------- 3:45 PM on 06/25/2018 -----------------------------------------  CT and UA are pending.  The patient is comfortable appearing.  I am signing her out to the oncoming physician Dr. Burlene Arnt.  ____________________________________________   FINAL CLINICAL IMPRESSION(S) / ED DIAGNOSES  Final diagnoses:  Pain of upper abdomen      NEW MEDICATIONS STARTED DURING THIS VISIT:  New Prescriptions   No medications on file     Note:  This document was prepared using Dragon voice recognition software and may include unintentional dictation errors.    Arta Silence, MD 06/25/18 [phone]

## 2018-06-25 NOTE — ED Notes (Signed)
Pt assisted to the restroom by this tech, pt laced back in the bed by this tech and pt provided a sandwich tray and Coke to drink

## 2018-06-25 NOTE — ED Provider Notes (Signed)
-----------------------------------------   4:53 PM on 06/25/2018 -----------------------------------------  Signed out to me at 345, pending urinalysis if negative patient is to be discharged.   Schuyler Amor, MD 06/25/18 [phone]

## 2018-06-25 NOTE — ED Notes (Signed)
Patient AAOx4. Vitals Stable. NAD. 

## 2018-06-27 ENCOUNTER — Ambulatory Visit
Admission: RE | Admit: 2018-06-27 | Discharge: 2018-06-27 | Disposition: A | Discharge status: Home / Self Care | Payer: Medicare Other | Source: Ambulatory Visit | Attending: Internal Medicine | Admitting: Internal Medicine

## 2018-06-27 ENCOUNTER — Other Ambulatory Visit: Payer: Self-pay

## 2018-06-27 ENCOUNTER — Other Ambulatory Visit: Payer: Self-pay | Admitting: Internal Medicine

## 2018-06-27 ENCOUNTER — Other Ambulatory Visit (HOSPITAL_COMMUNITY): Payer: Self-pay | Admitting: Internal Medicine

## 2018-06-27 DIAGNOSIS — R1011 Right upper quadrant pain: Secondary | ICD-10-CM | POA: Insufficient documentation

## 2018-06-28 ENCOUNTER — Inpatient Hospital Stay: Payer: Medicare Other | Admitting: Oncology

## 2018-07-14 ENCOUNTER — Other Ambulatory Visit: Admission: RE | Admit: 2018-07-14 | Payer: Medicare Other | Source: Ambulatory Visit

## 2018-07-15 ENCOUNTER — Inpatient Hospital Stay
Admission: EM | Admit: 2018-07-15 | Discharge: 2018-07-18 | DRG: 392 | Disposition: A | Discharge status: Skilled Nursing Facility | Payer: Medicare Other | Attending: Internal Medicine | Admitting: Internal Medicine

## 2018-07-15 ENCOUNTER — Emergency Department: Payer: Medicare Other

## 2018-07-15 ENCOUNTER — Other Ambulatory Visit: Payer: Self-pay

## 2018-07-15 ENCOUNTER — Encounter: Payer: Self-pay | Admitting: *Deleted

## 2018-07-15 ENCOUNTER — Other Ambulatory Visit: Admission: RE | Admit: 2018-07-15 | Payer: Medicare Other | Source: Ambulatory Visit

## 2018-07-15 DIAGNOSIS — G20 Parkinson's disease: Secondary | ICD-10-CM | POA: Diagnosis present

## 2018-07-15 DIAGNOSIS — R110 Nausea: Secondary | ICD-10-CM | POA: Diagnosis present

## 2018-07-15 DIAGNOSIS — I10 Essential (primary) hypertension: Secondary | ICD-10-CM | POA: Diagnosis present

## 2018-07-15 DIAGNOSIS — D649 Anemia, unspecified: Secondary | ICD-10-CM | POA: Diagnosis not present

## 2018-07-15 DIAGNOSIS — F329 Major depressive disorder, single episode, unspecified: Secondary | ICD-10-CM | POA: Diagnosis present

## 2018-07-15 DIAGNOSIS — Z1159 Encounter for screening for other viral diseases: Secondary | ICD-10-CM

## 2018-07-15 DIAGNOSIS — M1990 Unspecified osteoarthritis, unspecified site: Secondary | ICD-10-CM | POA: Diagnosis present

## 2018-07-15 DIAGNOSIS — Z8249 Family history of ischemic heart disease and other diseases of the circulatory system: Secondary | ICD-10-CM | POA: Diagnosis not present

## 2018-07-15 DIAGNOSIS — Y92009 Unspecified place in unspecified non-institutional (private) residence as the place of occurrence of the external cause: Secondary | ICD-10-CM | POA: Diagnosis not present

## 2018-07-15 DIAGNOSIS — D62 Acute posthemorrhagic anemia: Secondary | ICD-10-CM | POA: Diagnosis present

## 2018-07-15 DIAGNOSIS — Z79899 Other long term (current) drug therapy: Secondary | ICD-10-CM | POA: Diagnosis not present

## 2018-07-15 DIAGNOSIS — N179 Acute kidney failure, unspecified: Secondary | ICD-10-CM | POA: Diagnosis present

## 2018-07-15 DIAGNOSIS — E876 Hypokalemia: Secondary | ICD-10-CM | POA: Diagnosis present

## 2018-07-15 DIAGNOSIS — Z87891 Personal history of nicotine dependence: Secondary | ICD-10-CM | POA: Diagnosis not present

## 2018-07-15 DIAGNOSIS — K317 Polyp of stomach and duodenum: Secondary | ICD-10-CM | POA: Diagnosis not present

## 2018-07-15 DIAGNOSIS — K6289 Other specified diseases of anus and rectum: Secondary | ICD-10-CM | POA: Diagnosis not present

## 2018-07-15 DIAGNOSIS — R1013 Epigastric pain: Secondary | ICD-10-CM | POA: Diagnosis not present

## 2018-07-15 DIAGNOSIS — K644 Residual hemorrhoidal skin tags: Secondary | ICD-10-CM | POA: Diagnosis present

## 2018-07-15 DIAGNOSIS — Z79811 Long term (current) use of aromatase inhibitors: Secondary | ICD-10-CM

## 2018-07-15 DIAGNOSIS — K5730 Diverticulosis of large intestine without perforation or abscess without bleeding: Secondary | ICD-10-CM | POA: Diagnosis present

## 2018-07-15 DIAGNOSIS — T428X5A Adverse effect of antiparkinsonism drugs and other central muscle-tone depressants, initial encounter: Secondary | ICD-10-CM | POA: Diagnosis present

## 2018-07-15 DIAGNOSIS — Z803 Family history of malignant neoplasm of breast: Secondary | ICD-10-CM | POA: Diagnosis not present

## 2018-07-15 DIAGNOSIS — Z9012 Acquired absence of left breast and nipple: Secondary | ICD-10-CM

## 2018-07-15 DIAGNOSIS — Z66 Do not resuscitate: Secondary | ICD-10-CM | POA: Diagnosis present

## 2018-07-15 DIAGNOSIS — R531 Weakness: Secondary | ICD-10-CM

## 2018-07-15 DIAGNOSIS — E039 Hypothyroidism, unspecified: Secondary | ICD-10-CM | POA: Diagnosis present

## 2018-07-15 DIAGNOSIS — Z853 Personal history of malignant neoplasm of breast: Secondary | ICD-10-CM

## 2018-07-15 DIAGNOSIS — R634 Abnormal weight loss: Secondary | ICD-10-CM | POA: Diagnosis present

## 2018-07-15 DIAGNOSIS — K2970 Gastritis, unspecified, without bleeding: Principal | ICD-10-CM | POA: Diagnosis present

## 2018-07-15 DIAGNOSIS — R1084 Generalized abdominal pain: Secondary | ICD-10-CM | POA: Diagnosis not present

## 2018-07-15 DIAGNOSIS — E875 Hyperkalemia: Secondary | ICD-10-CM | POA: Diagnosis present

## 2018-07-15 DIAGNOSIS — E860 Dehydration: Secondary | ICD-10-CM | POA: Diagnosis present

## 2018-07-15 LAB — CBC
HCT: 31.1 % — ABNORMAL LOW (ref 36.0–46.0)
Hemoglobin: 10.8 g/dL — ABNORMAL LOW (ref 12.0–15.0)
MCH: 31.6 pg (ref 26.0–34.0)
MCHC: 34.7 g/dL (ref 30.0–36.0)
MCV: 90.9 fL (ref 80.0–100.0)
Platelets: 327 10*3/uL (ref 150–400)
RBC: 3.42 MIL/uL — ABNORMAL LOW (ref 3.87–5.11)
RDW: 11.7 % (ref 11.5–15.5)
WBC: 8.9 10*3/uL (ref 4.0–10.5)
nRBC: 0.3 % — ABNORMAL HIGH (ref 0.0–0.2)

## 2018-07-15 LAB — COMPREHENSIVE METABOLIC PANEL
ALT: <5 U/L (ref 0–44)
AST: 13 U/L — ABNORMAL LOW (ref 15–41)
Albumin: 3.7 g/dL (ref 3.5–5.0)
Alkaline Phosphatase: 76 U/L (ref 38–126)
Anion gap: 13 (ref 5–15)
BUN: 15 mg/dL (ref 8–23)
CO2: 23 mmol/L (ref 22–32)
Calcium: 9.7 mg/dL (ref 8.9–10.3)
Chloride: 100 mmol/L (ref 98–111)
Creatinine, Ser: 0.95 mg/dL (ref 0.44–1.00)
GFR calc Af Amer: >60 mL/min (ref >60)
GFR calc non Af Amer: >60 mL/min (ref >60)
Glucose, Bld: 99 mg/dL (ref 70–99)
Potassium: 2.6 mmol/L — CL (ref 3.5–5.1)
Sodium: 136 mmol/L (ref 135–145)
Total Bilirubin: 1.2 mg/dL (ref 0.3–1.2)
Total Protein: 6.5 g/dL (ref 6.5–8.1)

## 2018-07-15 LAB — TROPONIN I: Troponin I: <0.03 ng/mL (ref <0.03)

## 2018-07-15 LAB — URINALYSIS, COMPLETE (UACMP) WITH MICROSCOPIC
Bacteria, UA: NONE SEEN
Bilirubin Urine: NEGATIVE
Glucose, UA: NEGATIVE mg/dL
Hgb urine dipstick: NEGATIVE
Ketones, ur: 5 mg/dL — AB
Leukocytes,Ua: NEGATIVE
Nitrite: NEGATIVE
Protein, ur: NEGATIVE mg/dL
Specific Gravity, Urine: 1.015 (ref 1.005–1.030)
pH: 7 (ref 5.0–8.0)

## 2018-07-15 LAB — IRON AND TIBC
Iron: 268 ug/dL — ABNORMAL HIGH (ref 28–170)
Saturation Ratios: 88 % — ABNORMAL HIGH (ref 10.4–31.8)
TIBC: 304 ug/dL (ref 250–450)
UIBC: 36 ug/dL

## 2018-07-15 LAB — LIPASE, BLOOD: Lipase: 38 U/L (ref 11–51)

## 2018-07-15 LAB — MAGNESIUM: Magnesium: 1.8 mg/dL (ref 1.7–2.4)

## 2018-07-15 LAB — TSH
TSH: 5.925 u[IU]/mL — ABNORMAL HIGH (ref 0.350–4.500)
TSH: 5.744 u[IU]/mL — ABNORMAL HIGH (ref 0.350–4.500)

## 2018-07-15 LAB — CK: Total CK: 19 U/L — ABNORMAL LOW (ref 38–234)

## 2018-07-15 LAB — SARS CORONAVIRUS 2 BY RT PCR (HOSPITAL ORDER, PERFORMED IN ~~LOC~~ HOSPITAL LAB): SARS Coronavirus 2: NEGATIVE

## 2018-07-15 LAB — FERRITIN: Ferritin: 1187 ng/mL — ABNORMAL HIGH (ref 11–307)

## 2018-07-15 MED ORDER — POTASSIUM CHLORIDE 10 MEQ/100ML IV SOLN
10.0000 meq | INTRAVENOUS | Status: AC
  Administered 2018-07-15 – 2018-07-16 (×3): 10 meq via INTRAVENOUS
  Filled 2018-07-15 (×3): qty 100

## 2018-07-15 MED ORDER — ENOXAPARIN SODIUM 40 MG/0.4ML ~~LOC~~ SOLN: 40.0000 mg | SUBCUTANEOUS | Status: DC

## 2018-07-15 MED ORDER — ACETAMINOPHEN 325 MG PO TABS
650.0000 mg | ORAL_TABLET | Freq: Four times a day (QID) | ORAL | Status: DC | PRN
  Administered 2018-07-17 – 2018-07-18 (×3): 650 mg via ORAL
  Filled 2018-07-15 (×3): qty 2

## 2018-07-15 MED ORDER — SODIUM CHLORIDE 0.9 % IV BOLUS
1000.0000 mL | Freq: Once | INTRAVENOUS | Status: AC
  Administered 2018-07-15: 1000 mL via INTRAVENOUS

## 2018-07-15 MED ORDER — ADULT MULTIVITAMIN W/MINERALS CH
1.0000 | ORAL_TABLET | Freq: Every day | ORAL | Status: DC
  Administered 2018-07-16 – 2018-07-18 (×3): 1 via ORAL
  Filled 2018-07-15 (×3): qty 1

## 2018-07-15 MED ORDER — ACETAMINOPHEN 650 MG RE SUPP: 650.0000 mg | Freq: Four times a day (QID) | RECTAL | Status: DC | PRN

## 2018-07-15 MED ORDER — VITAMIN B-1 100 MG PO TABS
100.0000 mg | ORAL_TABLET | Freq: Every day | ORAL | Status: DC
  Administered 2018-07-16 – 2018-07-18 (×3): 100 mg via ORAL
  Filled 2018-07-15 (×3): qty 1

## 2018-07-15 MED ORDER — POTASSIUM CHLORIDE CRYS ER 20 MEQ PO TBCR
40.0000 meq | EXTENDED_RELEASE_TABLET | ORAL | Status: AC
  Administered 2018-07-16 (×2): 40 meq via ORAL
  Filled 2018-07-15 (×2): qty 2

## 2018-07-15 MED ORDER — TRAZODONE HCL 50 MG PO TABS
25.0000 mg | ORAL_TABLET | Freq: Every evening | ORAL | Status: DC | PRN
  Administered 2018-07-16 – 2018-07-17 (×2): 25 mg via ORAL
  Filled 2018-07-15 (×2): qty 1

## 2018-07-15 MED ORDER — SODIUM CHLORIDE 0.9 % IV SOLN
INTRAVENOUS | Status: DC
  Administered 2018-07-15: 23:00:00 via INTRAVENOUS

## 2018-07-15 MED ORDER — ONDANSETRON HCL 4 MG/2ML IJ SOLN
4.0000 mg | Freq: Four times a day (QID) | INTRAMUSCULAR | Status: DC | PRN
  Administered 2018-07-15 – 2018-07-17 (×3): 4 mg via INTRAVENOUS
  Filled 2018-07-15 (×2): qty 2

## 2018-07-15 MED ORDER — ONDANSETRON HCL 4 MG PO TABS
4.0000 mg | ORAL_TABLET | Freq: Four times a day (QID) | ORAL | Status: DC | PRN
  Administered 2018-07-16 – 2018-07-18 (×2): 4 mg via ORAL
  Filled 2018-07-15 (×2): qty 1

## 2018-07-15 MED ORDER — PANTOPRAZOLE SODIUM 40 MG IV SOLR
40.0000 mg | Freq: Two times a day (BID) | INTRAVENOUS | Status: DC
  Administered 2018-07-15 – 2018-07-18 (×6): 40 mg via INTRAVENOUS
  Filled 2018-07-15 (×6): qty 40

## 2018-07-15 MED ORDER — POLYETHYLENE GLYCOL 3350 17 G PO PACK: 17.0000 g | PACK | Freq: Every day | ORAL | Status: DC | PRN

## 2018-07-15 MED ORDER — FOLIC ACID 1 MG PO TABS
1.0000 mg | ORAL_TABLET | Freq: Every day | ORAL | Status: DC
  Administered 2018-07-16 – 2018-07-18 (×3): 1 mg via ORAL
  Filled 2018-07-15 (×3): qty 1

## 2018-07-15 MED ORDER — SODIUM CHLORIDE 0.9% FLUSH
3.0000 mL | Freq: Two times a day (BID) | INTRAVENOUS | Status: DC
  Administered 2018-07-16 – 2018-07-18 (×4): 3 mL via INTRAVENOUS

## 2018-07-15 MED ORDER — POTASSIUM CHLORIDE 10 MEQ/100ML IV SOLN
10.0000 meq | Freq: Once | INTRAVENOUS | Status: AC
  Administered 2018-07-15: 10 meq via INTRAVENOUS
  Filled 2018-07-15: qty 100

## 2018-07-15 NOTE — ED Notes (Signed)
ED TO INPATIENT HANDOFF REPORT  ED Nurse Name and Phone #: Shonique Pelphrey RN, [phone]  S Name/Age/Gender Kathy Bean 68 y.o. female Room/Bed: ED17A/ED17A  Code Status   Code Status: Full Code  Home/SNF/Other Home Patient oriented to: self, place, time and situation Is this baseline? Yes   Triage Complete: Triage complete  Chief Complaint Nausea; Weakness  Triage Note Pt to ED reporting increased weakness over the past two months. Pt reporting potential ulcer that she has an endoscopy scheduled for. Pt has not been able to eat and reports a 15 lb weight loss. Pt reports she has had dry heaves but has not been able to eat for the past 4 days. No fevers. Pt was seen in ED last week for similar complaint.    Allergies No Known Allergies  Level of Care/Admitting Diagnosis ED Disposition    ED Disposition Condition [facility]: [place] [100120]  Level of Care: Med-Surg [16]  Covid Evaluation: Confirmed COVID Negative  Diagnosis: Dehydration [276.51.ICD-9-CM]  Admitting Physician: Hillary Bow [[id number]]  Attending Physician: Hillary Bow [[id number]]  Estimated length of stay: past midnight tomorrow  Certification:: I certify this patient will need inpatient services for at least 2 midnights  PT Class (Do Not Modify): Inpatient [101]  PT Acc Code (Do Not Modify): Private [1]       B Medical/Surgery History Past Medical History:  Diagnosis Date  . Breast cancer ([place]) 11/2014   left  . Cancer [facility]) 2016   left breast  . Depression   . Dysrhythmia    heart racing  . Family history of adverse reaction to anesthesia    brother had difficulty going under anesthseia  . Headache    hx of migraines   period induced  . Hypertension   . Osteoarthritis   . Parkinson disease [facility])    Past Surgical History:  Procedure Laterality Date  . BREAST BIOPSY Left 10/2014   positive  . BREAST EXCISIONAL BIOPSY Left 11/29/2014   invasive mam  ca +  . DILATION AND CURETTAGE OF UTERUS    . Laminectomy Posterior Cervicle Decomp W/Facetectomy & Foraminotomy     [id number] x 3...Marland KitchenMarland KitchenEXCISION CENTRAL LEFT [place] HERNIATION L 2-3; Surgeon: Loni Dolly, MD; Location: [place]; Service: Orthopedics; Laterality: Left  . Laminectomy Posterior Lumbar Facetectomy & Foraminotomy W/Decomp     BILATERAL LUMBAR DECOMPRESSION L2-3, L3-4, L4-5; Surgeon: Loni Dolly, MD; Location: [place]; Service: Orthopedics; Laterality: Bilateral;  . PARTIAL MASTECTOMY WITH NEEDLE LOCALIZATION Left 11/29/2014   Procedure: PARTIAL MASTECTOMY WITH NEEDLE LOCALIZATION;  Surgeon: Leonie Green, MD;  Location: [facility];  Service: General;  Laterality: Left;  . SENTINEL NODE BIOPSY Left 11/29/2014   Procedure: SENTINEL NODE BIOPSY;  Surgeon: Leonie Green, MD;  Location: [facility];  Service: General;  Laterality: Left;     A IV Location/Drains/Wounds Patient Lines/Drains/Airways Status   Active Line/Drains/Airways    Name:   Placement date:   Placement time:   Site:   Days:   Peripheral IV 07/15/18 Left Antecubital   07/15/18    1640    Antecubital   less than 1   Incision (Closed) 11/29/14 Breast Left   11/29/14    1127     1324          Intake/Output Last 24 hours  Intake/Output Summary (Last 24 hours) at 07/15/2018 2131 Last data filed at 07/15/2018 1947 Gross per 24 hour  Intake 1100 ml  Output -  Net 1100 ml    Labs/Imaging Results for orders placed or performed during the hospital encounter of 07/15/18 (from the past 48 hour(s))  Lipase, blood     Status: None   Collection Time: 07/15/18  3:02 PM  Result Value Ref Range   Lipase 38 11 - 51 U/L    Comment: Performed at [facility], [address] [place], [place] [address]  Comprehensive metabolic panel     Status: Abnormal   Collection Time: 07/15/18  3:02 PM  Result Value Ref Range   Sodium 136 135 - 145 mmol/L   Potassium 2.6 (LL) 3.5 - 5.1 mmol/L    Comment: CRITICAL  RESULT CALLED TO, READ BACK BY AND VERIFIED WITH ANGELA ROBBIN @1530  07/15/18 MJU    Chloride 100 98 - 111 mmol/L   CO2 23 22 - 32 mmol/L   Glucose, Bld 99 70 - 99 mg/dL   BUN 15 8 - 23 mg/dL   Creatinine, Ser 0.95 0.44 - 1.00 mg/dL   Calcium 9.7 8.9 - 10.3 mg/dL   Total Protein 6.5 6.5 - 8.1 g/dL   Albumin 3.7 3.5 - 5.0 g/dL   AST 13 (L) 15 - 41 U/L   ALT <5 0 - 44 U/L   Alkaline Phosphatase 76 38 - 126 U/L   Total Bilirubin 1.2 0.3 - 1.2 mg/dL   GFR calc non Af Amer >60 >60 mL/min   GFR calc Af Amer >60 >60 mL/min   Anion gap 13 5 - 15    Comment: Performed at [facility], [address] [place], [place] [address]  CBC     Status: Abnormal   Collection Time: 07/15/18  3:02 PM  Result Value Ref Range   WBC 8.9 4.0 - 10.5 K/uL   RBC 3.42 (L) 3.87 - 5.11 MIL/uL   Hemoglobin 10.8 (L) 12.0 - 15.0 g/dL   HCT 31.1 (L) 36.0 - 46.0 %   MCV 90.9 80.0 - 100.0 fL   MCH 31.6 26.0 - 34.0 pg   MCHC 34.7 30.0 - 36.0 g/dL   RDW 11.7 11.5 - 15.5 %   Platelets 327 150 - 400 K/uL   nRBC 0.3 (H) 0.0 - 0.2 %    Comment: Performed at [facility] [address] [place], [place] [address]  Troponin I - Add-On to previous collection     Status: None   Collection Time: 07/15/18  3:02 PM  Result Value Ref Range   Troponin I <0.03 <0.03 ng/mL    Comment: Performed at [facility], [address] [place], [place] [address]  TSH     Status: Abnormal   Collection Time: 07/15/18  3:02 PM  Result Value Ref Range   TSH 5.925 (H) 0.350 - 4.500 uIU/mL    Comment: Performed by a 3rd Generation assay with a functional sensitivity of <=0.01 uIU/mL. Performed at [facility], [address] [place], [place] [address]   CK     Status: Abnormal   Collection Time: 07/15/18  3:02 PM  Result Value Ref Range   Total CK 19 (L) 38 - 234 U/L    Comment: Performed at [facility], [address] [place], [place] [address]  Urinalysis, Complete w Microscopic      Status: Abnormal   Collection Time: 07/15/18  4:55 PM  Result Value Ref Range   Color, Urine YELLOW (A) YELLOW   APPearance CLEAR (A) CLEAR   Specific Gravity, Urine  1.015 1.005 - 1.030   pH 7.0 5.0 - 8.0   Glucose, UA NEGATIVE NEGATIVE mg/dL   Hgb urine dipstick NEGATIVE NEGATIVE   Bilirubin Urine NEGATIVE NEGATIVE   Ketones, ur 5 (A) NEGATIVE mg/dL   Protein, ur NEGATIVE NEGATIVE mg/dL   Nitrite NEGATIVE NEGATIVE   Leukocytes,Ua NEGATIVE NEGATIVE   WBC, UA 0-5 0 - 5 WBC/hpf   Bacteria, UA NONE SEEN NONE SEEN   Squamous Epithelial / LPF 0-5 0 - 5   Mucus PRESENT    Amorphous Crystal PRESENT     Comment: Performed at [facility], [address] [place], [place] [address]  SARS Coronavirus 2 (CEPHEID - Performed in [place] hospital lab), Hosp Order     Status: None   Collection Time: 07/15/18  6:37 PM  Result Value Ref Range   SARS Coronavirus 2 NEGATIVE NEGATIVE    Comment: (NOTE) If result is NEGATIVE SARS-CoV-2 target nucleic acids are NOT DETECTED. The SARS-CoV-2 RNA is generally detectable in upper and lower  respiratory specimens during the acute phase of infection. The lowest  concentration of SARS-CoV-2 viral copies this assay can detect is 250  copies / mL. A negative result does not preclude SARS-CoV-2 infection  and should not be used as the sole basis for treatment or other  patient management decisions.  A negative result may occur with  improper specimen collection / handling, submission of specimen other  than nasopharyngeal swab, presence of viral mutation(s) within the  areas targeted by this assay, and inadequate number of viral copies  (<250 copies / mL). A negative result must be combined with clinical  observations, patient history, and epidemiological information. If result is POSITIVE SARS-CoV-2 target nucleic acids are DETECTED. The SARS-CoV-2 RNA is generally detectable in upper and lower  respiratory specimens dur ing the acute phase  of infection.  Positive  results are indicative of active infection with SARS-CoV-2.  Clinical  correlation with patient history and other diagnostic information is  necessary to determine patient infection status.  Positive results do  not rule out bacterial infection or co-infection with other viruses. If result is PRESUMPTIVE POSTIVE SARS-CoV-2 nucleic acids MAY BE PRESENT.   A presumptive positive result was obtained on the submitted specimen  and confirmed on repeat testing.  While 2019 novel coronavirus  (SARS-CoV-2) nucleic acids may be present in the submitted sample  additional confirmatory testing may be necessary for epidemiological  and / or clinical management purposes  to differentiate between  SARS-CoV-2 and other Sarbecovirus currently known to infect humans.  If clinically indicated additional testing with an alternate test  methodology [id number]) is advised. The SARS-CoV-2 RNA is generally  detectable in upper and lower respiratory sp ecimens during the acute  phase of infection. The expected result is Negative. Fact Sheet for Patients:  StrictlyIdeas.no Fact Sheet for Healthcare Providers: BankingDealers.co.za This test is not yet approved or cleared by the [place] FDA and has been authorized for detection and/or diagnosis of SARS-CoV-2 by FDA under an Emergency Use Authorization (EUA).  This EUA will remain in effect (meaning this test can be used) for the duration of the COVID-19 declaration under Section 564(b)(1) of the Act, 21 U.S.C. section 360bbb-3(b)(1), unless the authorization is terminated or revoked sooner. Performed at [facility], [address] [place], [place] [address]    Dg Chest Portable 1 View  Result Date: 07/15/2018 CLINICAL DATA:  Progressive weakness over the past 2 months. EXAM: PORTABLE CHEST 1 VIEW  COMPARISON:  None. FINDINGS: The cardiac silhouette, mediastinal and hilar  contours are within normal limits. There is mild tortuosity and calcification of the thoracic aorta. The lungs are clear. No pleural effusions. No worrisome pulmonary lesions. The bony thorax is intact. IMPRESSION: No acute cardiopulmonary findings. Electronically Signed   By: Marijo Sanes M.D.   On: 07/15/2018 15:57    Pending Labs Unresulted Labs (From admission, onward)    Start     Ordered   07/22/18 0500  Creatinine, serum  (enoxaparin (LOVENOX)    CrCl >/= 30 ml/min)  Weekly,   STAT    Comments:  while on enoxaparin therapy    07/15/18 2100   07/16/18 5409  Basic metabolic panel  Tomorrow morning,   STAT     07/15/18 2100   07/16/18 0500  CBC  Tomorrow morning,   STAT     07/15/18 2100   07/16/18 0500  HIV Antibody (routine testing w rflx)  Once,   R     07/16/18 0500   07/15/18 2112  Magnesium  Add-on,   AD     07/15/18 2111   07/15/18 2111  Occult blood card to lab, stool RN will collect  ONCE - STAT,   STAT    Question:  Specimen to be collected by?  Answer:  RN will collect   07/15/18 2110   07/15/18 2059  TSH  Once,   STAT     07/15/18 2100          Vitals/Pain Today's Vitals   07/15/18 1942 07/15/18 1947 07/15/18 2000 07/15/18 2100  BP:  121/80 (!) 122/93 99/74  Pulse:  81 78 86  Resp:  20 13 17   Temp:      TempSrc:      SpO2:  99% 98% 95%  Weight:      Height:      PainSc: 0-No pain 0-No pain      Isolation Precautions No active isolations  Medications Medications  sodium chloride flush (NS) 0.9 % injection 3 mL (has no administration in time range)  0.9 %  sodium chloride infusion (has no administration in time range)  acetaminophen (TYLENOL) tablet 650 mg (has no administration in time range)    Or  acetaminophen (TYLENOL) suppository 650 mg (has no administration in time range)  traZODone (DESYREL) tablet 25 mg (has no administration in time range)  polyethylene glycol (MIRALAX / GLYCOLAX) packet 17 g (has no administration in time range)   ondansetron (ZOFRAN) tablet 4 mg (has no administration in time range)    Or  ondansetron (ZOFRAN) injection 4 mg (has no administration in time range)  folic acid (FOLVITE) tablet 1 mg (has no administration in time range)  multivitamin with minerals tablet 1 tablet (has no administration in time range)  thiamine (VITAMIN B-1) tablet 100 mg (has no administration in time range)  potassium chloride 10 mEq in 100 mL IVPB (has no administration in time range)  pantoprazole (PROTONIX) injection 40 mg (has no administration in time range)  potassium chloride 10 mEq in 100 mL IVPB (0 mEq Intravenous Stopped 07/15/18 1838)  sodium chloride 0.9 % bolus 1,000 mL (0 mLs Intravenous Stopped 07/15/18 1947)    Mobility walks with person assist Low fall risk   Focused Assessments Cardiac Assessment Handoff:  Cardiac Rhythm: Normal sinus rhythm Lab Results  Component Value Date   CKTOTAL 19 (L) 07/15/2018   TROPONINI <0.03 07/15/2018   No results found for: DDIMER Does the Patient  currently have chest pain? No     R Recommendations: See Admitting Provider Note  Report given to:   Additional Notes: pt walks with walker and people assistance since MARCH 2020 with sciatica pain

## 2018-07-15 NOTE — ED Notes (Signed)
Potassium rate was decreased to 32ml/hr for c/o pain at IV site.

## 2018-07-15 NOTE — ED Notes (Addendum)
Patient denies further pain at IV site.

## 2018-07-15 NOTE — ED Triage Notes (Signed)
Pt to ED reporting increased weakness over the past two months. Pt reporting potential ulcer that she has an endoscopy scheduled for. Pt has not been able to eat and reports a 15 lb weight loss. Pt reports she has had dry heaves but has not been able to eat for the past 4 days. No fevers. Pt was seen in ED last week for similar complaint.

## 2018-07-15 NOTE — H&P (Signed)
[facility] [place] NAME: Kathy Bean    MR#:  [id number]  DATE OF BIRTH:  November 06, 1950  DATE OF ADMISSION:  07/15/2018  PRIMARY CARE PHYSICIAN: Rusty Aus, MD   REQUESTING/REFERRING PHYSICIAN: Ashok Cordia, MD  CHIEF COMPLAINT:   Chief Complaint  Patient presents with  . Weakness  . Nausea    HISTORY OF PRESENT ILLNESS:  Kathy Bean  is a 68 y.o. female with a known history of hypertension, Parkinson's disease, migraine headaches, depression, and a history of left breast cancer.  She presented to the emergency room complaining of increasing generalized weakness over the last 6 to 8 weeks as well as being "unable to eat ".  She reports that she no longer has an appetite and finds it difficult to eat as she does not want the food.  She has experienced nausea when attempting to force herself to eat at times.  However, patient denies experiencing vomiting.  She has noted some nausea with dry heaves in the morning when she wakes up.  She denies hematemesis, hematochezia, or melena.  She denies generalized abdominal pain.  However, she endorses tenderness in her epigastric area when she palpates the area.  She reportedly has seen Dr. Alice Reichert recently and planning endoscopy procedure.  Patient tells me she is not experiencing difficulty swallowing however, then again she also tells me her food has gotten stuck at times.  She denies a prior history of peptic ulcer disease or GI bleed.  She denies constipation or diarrhea.  Her last bowel movement was yesterday.  She denies chest pain, palpitations, shortness of breath, fever, chills.  On her presentation today, she reports she has been unable to eat in the last 4 days.  Potassium is 2.6 on her arrival.  PAST MEDICAL HISTORY:   Past Medical History:  Diagnosis Date  . Breast cancer ([place]) 11/2014   left  . Cancer [facility]) 2016   left breast  . Depression   . Dysrhythmia    heart racing  .  Family history of adverse reaction to anesthesia    brother had difficulty going under anesthseia  . Headache    hx of migraines   period induced  . Hypertension   . Osteoarthritis   . Parkinson disease ([place])     PAST SURGICAL HISTORY:   Past Surgical History:  Procedure Laterality Date  . BREAST BIOPSY Left 10/2014   positive  . BREAST EXCISIONAL BIOPSY Left 11/29/2014   invasive mam ca +  . DILATION AND CURETTAGE OF UTERUS    . Laminectomy Posterior Cervicle Decomp W/Facetectomy & Foraminotomy     [id number] x 3...Marland KitchenMarland KitchenEXCISION CENTRAL LEFT [place] HERNIATION L 2-3; Surgeon: Loni Dolly, MD; Location: [place]; Service: Orthopedics; Laterality: Left  . Laminectomy Posterior Lumbar Facetectomy & Foraminotomy W/Decomp     BILATERAL LUMBAR DECOMPRESSION L2-3, L3-4, L4-5; Surgeon: Loni Dolly, MD; Location: [place]; Service: Orthopedics; Laterality: Bilateral;  . PARTIAL MASTECTOMY WITH NEEDLE LOCALIZATION Left 11/29/2014   Procedure: PARTIAL MASTECTOMY WITH NEEDLE LOCALIZATION;  Surgeon: Leonie Green, MD;  Location: [facility];  Service: General;  Laterality: Left;  . SENTINEL NODE BIOPSY Left 11/29/2014   Procedure: SENTINEL NODE BIOPSY;  Surgeon: Leonie Green, MD;  Location: [facility];  Service: General;  Laterality: Left;    SOCIAL HISTORY:   Social History   Tobacco Use  . Smoking status: Former Smoker    Last attempt to quit: 11/22/1999  Years since quitting: 18.6  . Smokeless tobacco: Never Used  Substance Use Topics  . Alcohol use: Yes    Alcohol/week: 14.0 standard drinks    Types: 14 Glasses of wine per week    FAMILY HISTORY:   Family History  Problem Relation Age of Onset  . Breast cancer Maternal Aunt   . Breast cancer Paternal Aunt   . Coronary artery disease Mother   . Heart attack Mother   . Coronary artery disease Paternal Grandmother     DRUG ALLERGIES:  No Known Allergies  REVIEW OF SYSTEMS:   Review of Systems   Constitutional: Positive for malaise/fatigue and weight loss (15 # in a month). Negative for chills, diaphoresis and fever.  HENT: Negative for congestion, sinus pain and sore throat.   Eyes: Negative for blurred vision, double vision and pain.  Respiratory: Negative for cough, hemoptysis, sputum production, shortness of breath and wheezing.   Cardiovascular: Negative for chest pain, palpitations, orthopnea, leg swelling and PND.  Gastrointestinal: Positive for abdominal pain (epigastric tenderness) and nausea. Negative for blood in stool, constipation, diarrhea, heartburn and vomiting.  Genitourinary: Negative for dysuria, flank pain, frequency and hematuria.  Musculoskeletal: Negative for falls, joint pain, myalgias and neck pain.  Skin: Negative for itching and rash.  Neurological: Negative for dizziness, focal weakness, seizures, loss of consciousness and headaches.  Psychiatric/Behavioral: Negative.       MEDICATIONS AT HOME:   Prior to Admission medications   Medication Sig Start Date End Date Taking? Authorizing Provider  anastrozole (ARIMIDEX) 1 MG tablet TAKE 1 TABLET BY MOUTH EVERY DAY Patient taking differently: Take 1 mg by mouth daily.  04/24/18  Yes Lloyd Huger, MD  carbidopa-levodopa (SINEMET CR) 50-200 MG tablet Take 1 tablet by mouth at bedtime. 06/19/18  Yes [provider]  carbidopa-levodopa (SINEMET IR) 25-100 MG tablet Take 2 tablets by mouth 3 (three) times daily.    Yes [provider]  citalopram (CELEXA) 10 MG tablet Take 10 mg by mouth at bedtime.    Yes [provider]  Cyanocobalamin (RA VITAMIN B-12 TR) 1000 MCG TBCR Take 1,000 mcg by mouth daily.    Yes [provider]  metoCLOPramide (REGLAN) 5 MG tablet Take 5 mg by mouth 3 (three) times daily.  06/30/18  Yes [provider]  omeprazole (PRILOSEC) 40 MG capsule Take 40 mg by mouth 2 (two) times daily. 06/27/18  Yes [provider]  potassium  chloride (K-DUR) 10 MEQ tablet Take 10 mEq by mouth 2 (two) times daily.    Yes [provider]  triamterene-hydrochlorothiazide (MAXZIDE-25) 37.5-25 MG tablet Take 2 tablets by mouth daily.    Yes [provider]      VITAL SIGNS:  Blood pressure 118/80, pulse 80, temperature 98 F (36.7 C), temperature source Oral, resp. rate 15, height 5\' 7"  (1.702 m), weight 81.6 kg, SpO2 99 %.  PHYSICAL EXAMINATION:  Physical Exam Constitutional:      General: She is not in acute distress.    Appearance: Normal appearance. She is normal weight. She is not ill-appearing or diaphoretic.  HENT:     Head: Normocephalic and atraumatic.     Right Ear: External ear normal.     Left Ear: External ear normal.     Nose: Nose normal. No congestion.     Mouth/Throat:     Mouth: Mucous membranes are moist.     Pharynx: Oropharynx is clear.  Eyes:     General: No scleral  icterus.    Extraocular Movements: Extraocular movements intact.     Conjunctiva/sclera: Conjunctivae normal.     Pupils: Pupils are equal, round, and reactive to light.  Neck:     Musculoskeletal: Normal range of motion and neck supple. No muscular tenderness.  Cardiovascular:     Rate and Rhythm: Normal rate and regular rhythm.     Pulses: Normal pulses.     Heart sounds: Normal heart sounds. No murmur. No friction rub. No gallop.   Pulmonary:     Effort: Pulmonary effort is normal. No respiratory distress.     Breath sounds: Normal breath sounds. No wheezing, rhonchi or rales.  Chest:     Chest wall: No tenderness.  Abdominal:     General: Abdomen is flat. Bowel sounds are normal. There is no distension.     Palpations: Abdomen is soft. There is no mass.     Tenderness: There is abdominal tenderness (mild epigastric). There is no right CVA tenderness, left CVA tenderness, guarding or rebound.     Hernia: No hernia is present.  Musculoskeletal: Normal range of motion.        General: No swelling or tenderness.      Right lower leg: No edema.     Left lower leg: No edema.  Lymphadenopathy:     Cervical: No cervical adenopathy.  Skin:    General: Skin is warm and dry.     Capillary Refill: Capillary refill takes less than 2 seconds.     Coloration: Skin is not jaundiced.     Findings: No bruising or rash.  Neurological:     Mental Status: She is alert and oriented to person, place, and time. Mental status is at baseline.  Psychiatric:        Mood and Affect: Mood normal.        Behavior: Behavior normal.       LABORATORY PANEL:   CBC Recent Labs  Lab 07/15/18 1502  WBC 8.9  HGB 10.8*  HCT 31.1*  PLT 327   ------------------------------------------------------------------------------------------------------------------  Chemistries  Recent Labs  Lab 07/15/18 1502  NA 136  K 2.6*  CL 100  CO2 23  GLUCOSE 99  BUN 15  CREATININE 0.95  CALCIUM 9.7  MG 1.8  AST 13*  ALT <5  ALKPHOS 76  BILITOT 1.2   ------------------------------------------------------------------------------------------------------------------  Cardiac Enzymes Recent Labs  Lab 07/15/18 1502  TROPONINI <0.03   ------------------------------------------------------------------------------------------------------------------  RADIOLOGY:  Dg Chest Portable 1 View  Result Date: 07/15/2018 CLINICAL DATA:  Progressive weakness over the past 2 months. EXAM: PORTABLE CHEST 1 VIEW COMPARISON:  None. FINDINGS: The cardiac silhouette, mediastinal and hilar contours are within normal limits. There is mild tortuosity and calcification of the thoracic aorta. The lungs are clear. No pleural effusions. No worrisome pulmonary lesions. The bony thorax is intact. IMPRESSION: No acute cardiopulmonary findings. Electronically Signed   By: Marijo Sanes M.D.   On: 07/15/2018 15:57      IMPRESSION AND PLAN:   1.  Gastritis - We will treat patient with IV Protonix twice daily for possible gastritis given frequent  nausea and intolerance to food - Gastroenterology, Dr. Bonna Gains, consulted for further evaluation and recommendations as patient reports she has been seeing Dr. Alice Reichert planning endoscopy procedure for possible gastric ulcer or gastritis. - Will treat nausea with IV antiemetic - Dietitian has been consulted for supportive care -We will get stool for occult blood -Speech therapy has been consulted for swallowing evaluation as well  2.  Dehydration Mild- volume depletion- normal saline at 75 cc/h to peripheral IV - Repeat CBC and BMP in the a.m. -Telemetry monitoring - Likely secondary to above gastritis/food intolerance  3.  Hypokalemia -Potassium 2.6 on arrival -Patient receiving IV potassium replacement -We will repeat BMP in the a.m. - Telemetry monitoring  4. Parkinson's disease - Sinemet restarted - Fall precautions  DVT and PPI prophylaxis have been initiated  All the records are reviewed and case discussed with ED provider. The plan of care was discussed in details with the patient (and family). I answered all questions. The patient agreed to proceed with the above mentioned plan. Further management will depend upon hospital course.   CODE STATUS: Full code  TOTAL TIME TAKING CARE OF THIS PATIENT: 41minutes.    Paint on 07/15/2018 at 9:58 PM  Pager - [phone]  After 6pm go to [email or web address] - Proofreader  [facility]  Office  [phone]  CC: Primary care physician; Rusty Aus, MD   Note: This dictation was prepared with Dragon dictation along with smaller phrase technology. Any transcriptional errors that result from this process are unintentional.

## 2018-07-15 NOTE — ED Notes (Addendum)
Charge RN Theadora Rama and primary RN [employer] informed of critical potassium of 2.6  Attempted to notify A side MD, no answer

## 2018-07-15 NOTE — ED Notes (Signed)
Patient has multiple blankets on for comfort. NAD.

## 2018-07-15 NOTE — ED Provider Notes (Signed)
[facility] Emergency Department Provider Note  ____________________________________________   None    (approximate)  I have reviewed the triage vital signs and the nursing notes.   HISTORY  Chief Complaint Weakness and Nausea    HPI DAQUANA PADDOCK is a 68 y.o. female presents emergency department complaining of increased weakness over the past 2 months.  Patient states that she has had muscle mass loss due to not eating well.  She states she thinks that she has an ulcer and they have an endoscopic schedule.  States she has a 15 pound weight loss.  No vomiting or diarrhea but has not been able to eat for the past 4 days.    Past Medical History:  Diagnosis Date  . Breast cancer ([place]) 11/2014   left  . Cancer [facility]) 2016   left breast  . Depression   . Dysrhythmia    heart racing  . Family history of adverse reaction to anesthesia    brother had difficulty going under anesthseia  . Headache    hx of migraines   period induced  . Hypertension   . Osteoarthritis   . Parkinson disease [facility])     Patient Active Problem List   Diagnosis Date Noted  . Primary cancer of lower-inner quadrant of left female breast ([place]) 12/04/2014    Past Surgical History:  Procedure Laterality Date  . BREAST BIOPSY Left 10/2014   positive  . BREAST EXCISIONAL BIOPSY Left 11/29/2014   invasive mam ca +  . DILATION AND CURETTAGE OF UTERUS    . Laminectomy Posterior Cervicle Decomp W/Facetectomy & Foraminotomy     [id number] x 3...Marland KitchenMarland KitchenEXCISION CENTRAL LEFT [place] HERNIATION L 2-3; Surgeon: Loni Dolly, MD; Location: [place]; Service: Orthopedics; Laterality: Left  . Laminectomy Posterior Lumbar Facetectomy & Foraminotomy W/Decomp     BILATERAL LUMBAR DECOMPRESSION L2-3, L3-4, L4-5; Surgeon: Loni Dolly, MD; Location: [place]; Service: Orthopedics; Laterality: Bilateral;  . PARTIAL MASTECTOMY WITH NEEDLE LOCALIZATION Left 11/29/2014   Procedure: PARTIAL  MASTECTOMY WITH NEEDLE LOCALIZATION;  Surgeon: Leonie Green, MD;  Location: [facility];  Service: General;  Laterality: Left;  . SENTINEL NODE BIOPSY Left 11/29/2014   Procedure: SENTINEL NODE BIOPSY;  Surgeon: Leonie Green, MD;  Location: [facility];  Service: General;  Laterality: Left;    Prior to Admission medications   Medication Sig Start Date End Date Taking? Authorizing Provider  anastrozole (ARIMIDEX) 1 MG tablet TAKE 1 TABLET BY MOUTH EVERY DAY Patient taking differently: Take 1 mg by mouth daily.  04/24/18  Yes Lloyd Huger, MD  carbidopa-levodopa (SINEMET CR) 50-200 MG tablet Take 1 tablet by mouth at bedtime. 06/19/18  Yes [provider]  carbidopa-levodopa (SINEMET IR) 25-100 MG tablet Take 2 tablets by mouth 3 (three) times daily.    Yes [provider]  citalopram (CELEXA) 10 MG tablet Take 10 mg by mouth at bedtime.    Yes [provider]  Cyanocobalamin (RA VITAMIN B-12 TR) 1000 MCG TBCR Take 1,000 mcg by mouth daily.    Yes [provider]  metoCLOPramide (REGLAN) 5 MG tablet Take 5 mg by mouth 3 (three) times daily.  06/30/18  Yes [provider]  omeprazole (PRILOSEC) 40 MG capsule Take 40 mg by mouth 2 (two) times daily. 06/27/18  Yes [provider]  potassium chloride (K-DUR) 10 MEQ tablet Take 10 mEq by mouth 2 (two) times daily.    Yes [provider]  triamterene-hydrochlorothiazide [facility])  37.5-25 MG tablet Take 2 tablets by mouth daily.    Yes [provider]    Allergies Patient has no known allergies.  Family History  Problem Relation Age of Onset  . Breast cancer Maternal Aunt   . Breast cancer Paternal Aunt   . Coronary artery disease Mother   . Heart attack Mother   . Coronary artery disease Paternal Grandmother     Social History Social History   Tobacco Use  . Smoking status: Former Smoker    Last attempt to quit: 11/22/1999    Years since quitting: 18.6   . Smokeless tobacco: Never Used  Substance Use Topics  . Alcohol use: Yes    Alcohol/week: 14.0 standard drinks    Types: 14 Glasses of wine per week  . Drug use: No    Review of Systems  Constitutional: No fever/chills Eyes: No visual changes. ENT: No sore throat. Respiratory: Denies cough Cardiovascular: Denies chest pain Gastrointestinal: Positive for abdominal pain Genitourinary: Negative for dysuria. Musculoskeletal: Negative for back pain. Skin: Negative for rash.    ____________________________________________   PHYSICAL EXAM:  VITAL SIGNS: ED Triage Vitals  Enc Vitals Group     BP 07/15/18 1457 108/75     Pulse Rate 07/15/18 1457 94     Resp --      Temp 07/15/18 1457 98 F (36.7 C)     Temp Source 07/15/18 1457 Oral     SpO2 07/15/18 1457 100 %     Weight 07/15/18 1454 180 lb (81.6 kg)     Height 07/15/18 1454 5\' 7"  (1.702 m)     Head Circumference --      Peak Flow --      Pain Score 07/15/18 1457 5     Pain Loc --      Pain Edu? --      Excl. in [place]? --     Constitutional: Alert and oriented. Well appearing and in no acute distress. Eyes: Conjunctivae are normal.  Head: Atraumatic. Nose: No congestion/rhinnorhea. Mouth/Throat: Mucous membranes are moist.   Neck:  supple no lymphadenopathy noted Cardiovascular: Normal rate, regular rhythm. Heart sounds are normal Respiratory: Normal respiratory effort.  No retractions, lungs c t a  Abd: soft nontender to light palpation, pain reproduced in all quadrants with deep palpation Bs normal all 4 quad GU: deferred Musculoskeletal: FROM all extremities, warm and well perfused Neurologic:  Normal speech and language.  Skin:  Skin is warm, dry and intact. No rash noted. Psychiatric: Mood and affect are normal. Speech and behavior are normal.  ____________________________________________   LABS (all labs ordered are listed, but only abnormal results are displayed)  Labs Reviewed  COMPREHENSIVE  METABOLIC PANEL - Abnormal; Notable for the following components:      Result Value   Potassium 2.6 (*)    AST 13 (*)    All other components within normal limits  CBC - Abnormal; Notable for the following components:   RBC 3.42 (*)    Hemoglobin 10.8 (*)    HCT 31.1 (*)    nRBC 0.3 (*)    All other components within normal limits  URINALYSIS, COMPLETE (UACMP) WITH MICROSCOPIC - Abnormal; Notable for the following components:   Color, Urine YELLOW (*)    APPearance CLEAR (*)    Ketones, ur 5 (*)    All other components within normal limits  TSH - Abnormal; Notable for the following components:   TSH 5.925 (*)    All other  components within normal limits  CK - Abnormal; Notable for the following components:   Total CK 19 (*)    All other components within normal limits  SARS CORONAVIRUS 2 (HOSPITAL ORDER, [place] [facility])  LIPASE, BLOOD  TROPONIN I   ____________________________________________   ____________________________________________  RADIOLOGY  Chest x-ray is normal  ____________________________________________   PROCEDURES  Procedure(s) performed: Saline lock, potassium IV   Procedures    ____________________________________________   INITIAL IMPRESSION / ASSESSMENT AND PLAN / ED COURSE  Pertinent labs & imaging results that were available during my care of the patient were reviewed by me and considered in my medical decision making (see chart for details).   The patient is a 68 year old female presents emergency department complaining of abdominal pain, weakness, fatigue, and inability to eat for 4 days.  Physical exam the patient is afebrile.  Vitals appear to be normal.  Abdomen is tender to deep palpation only.  Remainder the exam is unremarkable  DDX: Failure to thrive, abdominal pain, gastric ulcers, weakness, hypokalemia  Labs for troponin are normal, CBC has decreased hemoglobin and hematocrit at 10.8 and 31.1,  comprehensive metabolic panel has critical value for potassium at 2.6, lipase is normal  ----------------------------------------- 7:51 PM on 07/15/2018 -----------------------------------------  CK totals 19, troponin is normal, TSH levels 5.9, UA is normal  Chest x-ray is normal  Discussed case Dr. Cherylann Banas.  States that the patient cannot tolerate p.o. medications that she should most likely be admitted due to the decreasing hemoglobin and dehydration.  Also due to the hyperkalemia.  Paged Dr. Darvin Neighbours.  He is aware patient will probably need to be admitted pending the COVID testing.     As part of my medical decision making, I reviewed the following data within the [place] notes reviewed and incorporated, Labs reviewed see above, EKG interpreted NSR, Old chart reviewed, Radiograph reviewed chest x-ray is normal, Discussed with admitting physician [place], Evaluated by EM attending Dr. Jimmye Norman, Notes from prior ED visits and [place] Controlled Substance Database  ____________________________________________   FINAL CLINICAL IMPRESSION(S) / ED DIAGNOSES  Final diagnoses:  Weakness  Nausea  Hypothyroidism, unspecified type      NEW MEDICATIONS STARTED DURING THIS VISIT:  New Prescriptions   No medications on file     Note:  This document was prepared using Dragon voice recognition software and may include unintentional dictation errors.    Versie Starks, PA-C 07/15/18 Lawerance Sabal, MD 07/15/18 2050

## 2018-07-15 NOTE — ED Notes (Signed)
Patient states she would prefer an in and out cath for the urine specimen. MD aware.

## 2018-07-15 NOTE — ED Notes (Signed)
Dr. Archie Balboa informed of potassium of 2.6

## 2018-07-15 NOTE — ED Notes (Signed)
Patient was given another warm blanket to wrap around shoulders per request.

## 2018-07-15 NOTE — ED Notes (Signed)
Report given to Lissa Hoard RN

## 2018-07-15 NOTE — H&P (Signed)
Patient seen and examined, agree with detailed note below. Patient presentation and plan discussed on rounds with APP.     *Acute blood loss anemia *Likely upper GI bleed *Hypokalemia  Patient will be admitted for further GI work-up, trend hemoglobin, replace potassium.  Protonix.

## 2018-07-15 NOTE — Progress Notes (Signed)
Advance care planning  Purpose of Encounter Anemia  Parties in Attendance Patient  Patients Decisional capacity Alert and oriented.  Able to make medical decisions.  Discussed in detail regarding anemia.  Treatment plan , prognosis discussed.  All questions answered  Discussed regarding CODE STATUS.  Patient wishes for DO NOT RESUSCITATE DO NOT INTUBATE status.  Orders entered and CODE STATUS changed  DNR/DNI  Time spent - 17 minutes

## 2018-07-16 DIAGNOSIS — R1084 Generalized abdominal pain: Secondary | ICD-10-CM

## 2018-07-16 LAB — CBC
HCT: 27.8 % — ABNORMAL LOW (ref 36.0–46.0)
Hemoglobin: 9.3 g/dL — ABNORMAL LOW (ref 12.0–15.0)
MCH: 31.3 pg (ref 26.0–34.0)
MCHC: 33.5 g/dL (ref 30.0–36.0)
MCV: 93.6 fL (ref 80.0–100.0)
Platelets: 256 10*3/uL (ref 150–400)
RBC: 2.97 MIL/uL — ABNORMAL LOW (ref 3.87–5.11)
RDW: 11.7 % (ref 11.5–15.5)
WBC: 6.5 10*3/uL (ref 4.0–10.5)
nRBC: 0.3 % — ABNORMAL HIGH (ref 0.0–0.2)

## 2018-07-16 LAB — BASIC METABOLIC PANEL
Anion gap: 7 (ref 5–15)
BUN: 14 mg/dL (ref 8–23)
CO2: 25 mmol/L (ref 22–32)
Calcium: 8.7 mg/dL — ABNORMAL LOW (ref 8.9–10.3)
Chloride: 107 mmol/L (ref 98–111)
Creatinine, Ser: 0.71 mg/dL (ref 0.44–1.00)
GFR calc Af Amer: >60 mL/min (ref >60)
GFR calc non Af Amer: >60 mL/min (ref >60)
Glucose, Bld: 74 mg/dL (ref 70–99)
Potassium: 3.6 mmol/L (ref 3.5–5.1)
Sodium: 139 mmol/L (ref 135–145)

## 2018-07-16 MED ORDER — BISACODYL 5 MG PO TBEC
10.0000 mg | DELAYED_RELEASE_TABLET | Freq: Once | ORAL | Status: AC
  Administered 2018-07-16: 10 mg via ORAL
  Filled 2018-07-16: qty 2

## 2018-07-16 MED ORDER — PEG 3350-KCL-NA BICARB-NACL 420 G PO SOLR
4000.0000 mL | Freq: Once | ORAL | Status: AC
  Administered 2018-07-16: 4000 mL via ORAL
  Filled 2018-07-16: qty 4000

## 2018-07-16 MED ORDER — PANTOPRAZOLE SODIUM 40 MG PO TBEC: 40.0000 mg | DELAYED_RELEASE_TABLET | Freq: Every day | ORAL | Status: DC

## 2018-07-16 MED ORDER — POTASSIUM CHLORIDE CRYS ER 20 MEQ PO TBCR: 40.0000 meq | EXTENDED_RELEASE_TABLET | Freq: Once | ORAL | Status: DC

## 2018-07-16 MED ORDER — POTASSIUM CHLORIDE CRYS ER 10 MEQ PO TBCR
10.0000 meq | EXTENDED_RELEASE_TABLET | Freq: Two times a day (BID) | ORAL | Status: DC
  Administered 2018-07-16 (×2): 10 meq via ORAL
  Filled 2018-07-16 (×2): qty 1

## 2018-07-16 MED ORDER — CARBIDOPA-LEVODOPA ER 50-200 MG PO TBCR
1.0000 | EXTENDED_RELEASE_TABLET | Freq: Every day | ORAL | Status: DC
  Administered 2018-07-16: 1 via ORAL
  Filled 2018-07-16 (×4): qty 1

## 2018-07-16 MED ORDER — VITAMIN B-12 1000 MCG PO TABS
1000.0000 ug | ORAL_TABLET | Freq: Every day | ORAL | Status: DC
  Administered 2018-07-16 – 2018-07-18 (×3): 1000 ug via ORAL
  Filled 2018-07-16 (×3): qty 1

## 2018-07-16 MED ORDER — CITALOPRAM HYDROBROMIDE 20 MG PO TABS
10.0000 mg | ORAL_TABLET | Freq: Every day | ORAL | Status: DC
  Administered 2018-07-16 – 2018-07-17 (×3): 10 mg via ORAL
  Filled 2018-07-16 (×3): qty 1

## 2018-07-16 MED ORDER — CARBIDOPA-LEVODOPA 25-100 MG PO TABS
2.0000 | ORAL_TABLET | Freq: Three times a day (TID) | ORAL | Status: DC
  Administered 2018-07-16 – 2018-07-18 (×8): 2 via ORAL
  Filled 2018-07-16 (×10): qty 2

## 2018-07-16 MED ORDER — METOCLOPRAMIDE HCL 5 MG PO TABS
5.0000 mg | ORAL_TABLET | Freq: Three times a day (TID) | ORAL | Status: DC
  Administered 2018-07-16 – 2018-07-18 (×8): 5 mg via ORAL
  Filled 2018-07-16 (×8): qty 1

## 2018-07-16 NOTE — Progress Notes (Signed)
SLP Cancellation Note  Patient Details Name: Kathy Bean MRN: [id number] DOB: 1950-03-06   Cancelled treatment:       Reason Eval/Treat Not Completed: Patient not medically ready; Patient with strict clear liquid diet orders today and NPO after midnight d/t upcoming endoscopy. At this time, patient denied swallowing issues with thin liquids. Will follow up with swallowing evaluation contingent upon endoscopy results and clearance from MD to trial solid diet.   Loni Beckwith, M.S. CCC-SLP Speech-Language Pathologist   Loni Beckwith 07/16/2018, 9:45 AM

## 2018-07-16 NOTE — Progress Notes (Signed)
[facility] [place] NAME: Cydne Grahn    MR#:  [id number]  DATE OF BIRTH:  [birth year]  SUBJECTIVE:  CHIEF COMPLAINT:   Chief Complaint  Patient presents with  . Weakness  . Nausea   Continues to complain of loss of appetite and nausea. Feels weak  REVIEW OF SYSTEMS:    Review of Systems  Constitutional: Positive for malaise/fatigue. Negative for chills and fever.  HENT: Negative for sore throat.   Eyes: Negative for blurred vision, double vision and pain.  Respiratory: Negative for cough, hemoptysis, shortness of breath and wheezing.   Cardiovascular: Negative for chest pain, palpitations, orthopnea and leg swelling.  Gastrointestinal: Positive for nausea. Negative for abdominal pain, constipation, diarrhea, heartburn and vomiting.  Genitourinary: Negative for dysuria and hematuria.  Musculoskeletal: Negative for back pain and joint pain.  Skin: Negative for rash.  Neurological: Negative for sensory change, speech change, focal weakness and headaches.  Endo/Heme/Allergies: Does not bruise/bleed easily.  Psychiatric/Behavioral: Negative for depression. The patient is not nervous/anxious.     DRUG ALLERGIES:  No Known Allergies  VITALS:  Blood pressure (!) 131/92, pulse 81, temperature 98.7 F (37.1 C), temperature source Oral, resp. rate 20, height 5\' 8"  (1.727 m), weight 70.7 kg, SpO2 98 %.  PHYSICAL EXAMINATION:   Physical Exam  GENERAL:  68 y.o.-year-old patient lying in the bed with no acute distress.  EYES: Pupils equal, round, reactive to light and accommodation. No scleral icterus. Extraocular muscles intact.  HEENT: Head atraumatic, normocephalic. Oropharynx and nasopharynx clear.  NECK:  Supple, no jugular venous distention. No thyroid enlargement, no tenderness.  LUNGS: Normal breath sounds bilaterally, no wheezing, rales, rhonchi. No use of accessory muscles of respiration.  CARDIOVASCULAR: S1, S2 normal. No  murmurs, rubs, or gallops.  ABDOMEN: Soft, nontender, nondistended. Bowel sounds present. No organomegaly or mass.  EXTREMITIES: No cyanosis, clubbing or edema b/l.    NEUROLOGIC: Cranial nerves II through XII are intact. No focal Motor or sensory deficits b/l.   PSYCHIATRIC: The patient is alert and oriented x 3.  SKIN: No obvious rash, lesion, or ulcer.   LABORATORY PANEL:   CBC Recent Labs  Lab 07/16/18 0504  WBC 6.5  HGB 9.3*  HCT 27.8*  PLT 256   ------------------------------------------------------------------------------------------------------------------ Chemistries  Recent Labs  Lab 07/15/18 1502 07/16/18 0504  NA 136 139  K 2.6* 3.6  CL 100 107  CO2 23 25  GLUCOSE 99 74  BUN 15 14  CREATININE 0.95 0.71  CALCIUM 9.7 8.7*  MG 1.8  --   AST 13*  --   ALT <5  --   ALKPHOS 76  --   BILITOT 1.2  --    ------------------------------------------------------------------------------------------------------------------  Cardiac Enzymes Recent Labs  Lab 07/15/18 1502  TROPONINI <0.03   ------------------------------------------------------------------------------------------------------------------  RADIOLOGY:  Dg Chest Portable 1 View  Result Date: 07/15/2018 CLINICAL DATA:  Progressive weakness over the past 2 months. EXAM: PORTABLE CHEST 1 VIEW COMPARISON:  None. FINDINGS: The cardiac silhouette, mediastinal and hilar contours are within normal limits. There is mild tortuosity and calcification of the thoracic aorta. The lungs are clear. No pleural effusions. No worrisome pulmonary lesions. The bony thorax is intact. IMPRESSION: No acute cardiopulmonary findings. Electronically Signed   By: Marijo Sanes M.D.   On: 07/15/2018 15:57     ASSESSMENT AND PLAN:   *Acute blood loss anemia.  Upper GI bleed suspected with her symptoms. Monitor hemoglobin.  Transfuse if hemoglobin less than  7 or patient is symptomatic. Protonix. GI consulted. EGD tomorrow. On  clear liquids.  *Dehydration due to decreased oral intake.  IV fluids started on admission.  Stopped today.  *Hypokalemia due to decreased oral intake.  Replace aggressively through oral and IV.  Normal today.  * Parkinson's disease - Sinemet restarted  All the records are reviewed and case discussed with Care Management/Social Worker. Management plans discussed with the patient, family and they are in agreement.  CODE STATUS: DNR/DNI  DVT Prophylaxis: SCDs  TOTAL TIME TAKING CARE OF THIS PATIENT: 35 minutes.   POSSIBLE D/C IN 1-2 DAYS, DEPENDING ON CLINICAL CONDITION.  Leia Alf Ross Hefferan M.D on 07/16/2018 at 11:08 AM  Between 7am to 6pm - Pager - [phone]  After 6pm go to [email or web address] - password [email or web address] [facility]  Office  [phone]  CC: Primary care physician; Rusty Aus, MD  Note: This dictation was prepared with Dragon dictation along with smaller phrase technology. Any transcriptional errors that result from this process are unintentional.

## 2018-07-16 NOTE — Evaluation (Signed)
Physical Therapy Evaluation Patient Details Name: Kathy Bean MRN: [id number] DOB: 09-03-1950 Today's Date: 07/16/2018   History of Present Illness  Kathy Bean is a [name] who comes to [facility] on 6/5 after 6-8 weeks debility, ABD pain, decreased PO intake and anorexia, 15lb weight loss, pt noted to have hyopkalemia. Pt suspected to have ABLA, GIB. Pt scheduled for endoscopy on 6/7.  PMH: Parkinson's (x5Y), HTN., migraine, depression, LtBrCA.   Clinical Impression  Pt admitted with above diagnosis. Pt currently with functional limitations due to the deficits listed below (see "PT Problem List"). modI bed mobility, education on safe RW use for transfers c minGuard assist and mod effort, AMB limited to less than 156ft 2/2 fatigue. No   Noted gait instability, but resumption of diet and return to proper nutrition will without a doubt help patient to gradually improve tolerance to daily mobility. Functional mobility assessment demonstrates increased effort/time requirements, poor tolerance, and need for physical assistance, whereas the patient performed these at a higher level of independence PTA. Pt will benefit from skilled PT intervention to increase independence and safety with basic mobility in preparation for discharge to the venue listed below.       Follow Up Recommendations Home health PT;Supervision for mobility/OOB    Equipment Recommendations  None recommended by PT    Recommendations for Other Services       Precautions / Restrictions Precautions Precautions: Fall Precaution Comments: Recent history of very limiting sciatica.  Restrictions Weight Bearing Restrictions: No      Mobility  Bed Mobility Overal bed mobility: Modified Independent                Transfers Overall transfer level: Modified independent Equipment used: None;Rolling walker (2 wheeled)             General transfer comment: multimodal cues to rise and sit safely with RW; pt will need  a reminder of this as she has well formed unsafe habits  Ambulation/Gait Ambulation/Gait assistance: Min guard Gait Distance (Feet): 70 Feet Assistive device: Rolling walker (2 wheeled) Gait Pattern/deviations: WFL(Within Functional Limits)     General Gait Details: limited by fatigue and deconditioning, but in general appears to tolerate well!   Stairs            Wheelchair Mobility    Modified Rankin (Stroke Patients Only)       Balance Overall balance assessment: Modified Independent;No apparent balance deficits (not formally assessed)                                           Pertinent Vitals/Pain Pain Assessment: No/denies pain    Home Living Family/patient expects to be discharged to:: Private residence Living Arrangements: Spouse/significant other;Children;Other relatives(69yoDTR, 2 grandDTRs 14 adn 16yo ) Available Help at Discharge: Family Type of Home: House Home Access: Stairs to enter   [employer] of Steps: 2 Home Layout: Two level Home Equipment: Wheelchair - [employer] - 2 wheels;Shower seat      Prior Function Level of Independence: Needs assistance         Comments: was able to go to [place] in Jan and do some wlaking, some electric scooter use 2/2 sciatica issues; Has needed assistaince with ADL since Feb d/t on-going progressive sciatica issues.     Hand Dominance        Extremity/Trunk Assessment  Communication   Communication: No difficulties  Cognition Arousal/Alertness: Awake/alert Behavior During Therapy: WFL for tasks assessed/performed Overall Cognitive Status: Within Functional Limits for tasks assessed                                        General Comments      Exercises     Assessment/Plan    PT Assessment Patient needs continued PT services  PT Problem List Decreased strength;Decreased activity tolerance;Decreased balance;Decreased  mobility       PT Treatment Interventions DME instruction;Therapeutic exercise;Gait training;Stair training;Functional mobility training;Therapeutic activities;Patient/family education    PT Goals (Current goals can be found in the Care Plan section)  Acute Rehab PT Goals Patient Stated Goal: regain strength and AMB function PT Goal Formulation: With patient Time For Goal Achievement: 07/30/18 Potential to Achieve Goals: Good    Frequency Min 2X/week   Barriers to discharge Inaccessible home environment      Co-evaluation               AM-PAC PT "6 Clicks" Mobility  Outcome Measure Help needed turning from your back to your side while in a flat bed without using bedrails?: None Help needed moving from lying on your back to sitting on the side of a flat bed without using bedrails?: None Help needed moving to and from a bed to a chair (including a wheelchair)?: A Little Help needed standing up from a chair using your arms (e.g., wheelchair or bedside chair)?: A Little Help needed to walk in hospital room?: A Little Help needed climbing 3-5 steps with a railing? : A Little 6 Click Score: 20    End of Session Equipment Utilized During Treatment: Gait belt Activity Tolerance: Patient tolerated treatment well;Patient limited by fatigue;No increased pain(mild and intermittent nausea) Patient left: in chair;with call bell/phone within reach;with SCD's reapplied Nurse Communication: Mobility status PT Visit Diagnosis: Unsteadiness on feet (R26.81);Other abnormalities of gait and mobility (R26.89);Muscle weakness (generalized) (M62.81);Difficulty in walking, not elsewhere classified (R26.2)    Time: 1125-1150 PT Time Calculation (min) (ACUTE ONLY): 25 min   Charges:   PT Evaluation $PT Eval Low Complexity: 1 Low PT Treatments $Therapeutic Exercise: 8-22 mins        1:23 PM, 07/16/18 Etta Grandchild, PT, DPT Physical Therapist - [facility]  [phone] ([place])    [place] [facility] 07/16/2018, 1:21 PM

## 2018-07-16 NOTE — Progress Notes (Signed)
Spoke with patient's husband to update on how patient is doing and plan of care. Appreciation expressed.

## 2018-07-16 NOTE — Anesthesia Preprocedure Evaluation (Addendum)
Anesthesia Evaluation  Patient identified by MRN, date of birth, ID band Patient awake    Reviewed: Allergy & Precautions, H&P , NPO status , Patient's Chart, lab work & pertinent test results  History of Anesthesia Complications Negative for: history of anesthetic complications  Airway Mallampati: III  TM Distance: <3 FB Neck ROM: limited    Dental  (+) Chipped   Pulmonary neg shortness of breath, former smoker,           Cardiovascular Exercise Tolerance: Good hypertension, (-) angina(-) Past MI and (-) DOE + dysrhythmias      Neuro/Psych  Headaches, PSYCHIATRIC DISORDERS Depression    GI/Hepatic negative GI ROS, Neg liver ROS, neg GERD  ,  Endo/Other  negative endocrine ROS  Renal/GU negative Renal ROS  negative genitourinary   Musculoskeletal  (+) Arthritis ,   Abdominal   Peds  Hematology negative hematology ROS (+)   Anesthesia Other Findings Past Medical History: 11/2014: Breast cancer ([place])     Comment:  left 2016: Cancer ([place])     Comment:  left breast No date: Depression No date: Dysrhythmia     Comment:  heart racing No date: Family history of adverse reaction to anesthesia     Comment:  brother had difficulty going under anesthseia No date: Headache     Comment:  hx of migraines   period induced No date: Hypertension No date: Osteoarthritis No date: Parkinson disease [facility])  Past Surgical History: 10/2014: BREAST BIOPSY; Left     Comment:  positive 11/29/2014: BREAST EXCISIONAL BIOPSY; Left     Comment:  invasive mam ca + No date: DILATION AND CURETTAGE OF UTERUS No date: Laminectomy Posterior Cervicle Decomp W/Facetectomy &  Foraminotomy     Comment:  [id number] x 3...Marland KitchenMarland KitchenEXCISION CENTRAL LEFT [place] HERNIATION L               2-3; Surgeon: Loni Dolly, MD; Location: [place];               Service: Orthopedics; Laterality: Left No date: Laminectomy Posterior Lumbar Facetectomy &  Foraminotomy W/ Decomp     Comment:  BILATERAL LUMBAR DECOMPRESSION L2-3, L3-4, L4-5;               Surgeon: Loni Dolly, MD; Location: [place];               Service: Orthopedics; Laterality: Bilateral; 11/29/2014: PARTIAL MASTECTOMY WITH NEEDLE LOCALIZATION; Left     Comment:  Procedure: PARTIAL MASTECTOMY WITH NEEDLE LOCALIZATION;               Surgeon: Leonie Green, MD;  Location: [facility];                Service: General;  Laterality: Left; 11/29/2014: SENTINEL NODE BIOPSY; Left     Comment:  Procedure: SENTINEL NODE BIOPSY;  Surgeon: Leonie Green, MD;  Location: [facility];  Service: General;                Laterality: Left;  BMI    Body Mass Index:  23.70 kg/m      Reproductive/Obstetrics negative OB ROS                            Anesthesia Physical Anesthesia Plan  ASA: III  Anesthesia Plan: General   Post-op Pain Management:    Induction: Intravenous  PONV Risk Score and Plan: Propofol infusion and TIVA  Airway Management Planned: Natural Airway and Nasal Cannula  Additional Equipment:   Intra-op Plan:   Post-operative Plan:   Informed Consent: I have reviewed the patients History and Physical, chart, labs and discussed the procedure including the risks, benefits and alternatives for the proposed anesthesia with the patient or authorized representative who has indicated his/her understanding and acceptance.   Patient has DNR.  Discussed DNR with patient and Suspend DNR.   Dental Advisory Given  Plan Discussed with: Anesthesiologist and CRNA  Anesthesia Plan Comments: (Patient consented for risks of anesthesia including but not limited to:  - adverse reactions to medications - risk of intubation if required - damage to teeth, lips or other oral mucosa - sore throat or hoarseness - Damage to heart, brain, lungs or loss of life  Patient voiced understanding.)       Anesthesia Quick  Evaluation

## 2018-07-16 NOTE — Consult Note (Signed)
Vonda Antigua, MD [address], [place], [place], [place], [address] [address], [place], [place], [place], [address] Phone: [phone]  Fax: [phone]  Consultation  Referring Provider:     Dr. Alveta Heimlich Primary Care Physician:  Rusty Aus, MD Reason for Consultation:    Abdominal Pain, Nausea  Date of Admission:  07/15/2018 Date of Consultation:  07/16/2018         HPI:   Kathy Bean is a 68 y.o. female seen by Dr. Alice Reichert 4 days ago for abdominal pain and Nausea with Upper and Lower Endoscopy planned for her symptoms admitted with the same symptoms. 1 month history of epigastric abdominal pain.  Dull, 6/10, nonradiating.  Associated with nausea but no vomiting.  No dysphagia.  No sources of bleeding, denies hematochezia, hematemesis, melena, epistaxis, hematuria or any other sources of bleeding.  Reports subjective 15 pound weight loss in 1 month.  No prior EGD or colonoscopy.  Denies family history of colon cancer.  Past Medical History:  Diagnosis Date   Breast cancer ([place]) 11/2014   left   Cancer ([place]) 2016   left breast   Depression    Dysrhythmia    heart racing   Family history of adverse reaction to anesthesia    brother had difficulty going under anesthseia   Headache    hx of migraines   period induced   Hypertension    Osteoarthritis    Parkinson disease [facility])     Past Surgical History:  Procedure Laterality Date   BREAST BIOPSY Left 10/2014   positive   BREAST EXCISIONAL BIOPSY Left 11/29/2014   invasive mam ca +   DILATION AND CURETTAGE OF UTERUS     Laminectomy Posterior Cervicle Decomp W/Facetectomy & Foraminotomy     [id number] x 3...Marland KitchenMarland KitchenEXCISION CENTRAL LEFT [place] HERNIATION L 2-3; Surgeon: Loni Dolly, MD; Location: [place]; Service: Orthopedics; Laterality: Left   Laminectomy Posterior Lumbar Facetectomy & Foraminotomy W/Decomp     BILATERAL LUMBAR DECOMPRESSION L2-3, L3-4, L4-5; Surgeon: Loni Dolly, MD;  Location: [place]; Service: Orthopedics; Laterality: Bilateral;   PARTIAL MASTECTOMY WITH NEEDLE LOCALIZATION Left 11/29/2014   Procedure: PARTIAL MASTECTOMY WITH NEEDLE LOCALIZATION;  Surgeon: Leonie Green, MD;  Location: [facility];  Service: General;  Laterality: Left;   SENTINEL NODE BIOPSY Left 11/29/2014   Procedure: SENTINEL NODE BIOPSY;  Surgeon: Leonie Green, MD;  Location: [facility];  Service: General;  Laterality: Left;    Prior to Admission medications   Medication Sig Start Date End Date Taking? Authorizing Provider  anastrozole (ARIMIDEX) 1 MG tablet TAKE 1 TABLET BY MOUTH EVERY DAY Patient taking differently: Take 1 mg by mouth daily.  04/24/18  Yes Lloyd Huger, MD  carbidopa-levodopa (SINEMET CR) 50-200 MG tablet Take 1 tablet by mouth at bedtime. 06/19/18  Yes [provider]  carbidopa-levodopa (SINEMET IR) 25-100 MG tablet Take 2 tablets by mouth 3 (three) times daily.    Yes [provider]  citalopram (CELEXA) 10 MG tablet Take 10 mg by mouth at bedtime.    Yes [provider]  Cyanocobalamin (RA VITAMIN B-12 TR) 1000 MCG TBCR Take 1,000 mcg by mouth daily.    Yes [provider]  metoCLOPramide (REGLAN) 5 MG tablet Take 5 mg by mouth 3 (three) times daily.  06/30/18  Yes [provider]  omeprazole (PRILOSEC) 40 MG capsule Take 40 mg by mouth 2 (two) times daily. 06/27/18  Yes [provider]  potassium chloride (K-DUR) 10 MEQ tablet Take 10 mEq by mouth 2 (two) times daily.    Yes [provider]  triamterene-hydrochlorothiazide (MAXZIDE-25) 37.5-25 MG tablet Take 2 tablets by mouth daily.    Yes [provider]    Family History  Problem Relation Age of Onset   Breast cancer Maternal Aunt    Breast cancer Paternal Aunt    Coronary artery disease Mother    Heart attack Mother    Coronary artery disease Paternal Grandmother      Social History   Tobacco Use   Smoking  status: Former Smoker    Last attempt to quit: 11/22/1999    Years since quitting: 18.6   Smokeless tobacco: Never Used  Substance Use Topics   Alcohol use: Yes    Alcohol/week: 14.0 standard drinks    Types: 14 Glasses of wine per week   Drug use: No    Allergies as of 07/15/2018   (No Known Allergies)    Review of Systems:    All systems reviewed and negative except where noted in HPI.   Physical Exam:  Vital signs in last 24 hours: Vitals:   07/15/18 2219 07/15/18 2222 07/15/18 2232 07/16/18 0432  BP: (!) 96/55   (!) 131/92  Pulse: 82 80  81  Resp:    20  Temp:    98.7 F (37.1 C)  TempSrc:    Oral  SpO2: (!) 82% 100%  98%  Weight:   70.7 kg   Height:   5\' 8"  (1.727 m)    Last BM Date: 07/14/18 General:   Pleasant, cooperative in NAD Head:  Normocephalic and atraumatic. Eyes:   No icterus.   Conjunctiva pink. PERRLA. Ears:  Normal auditory acuity. Neck:  Supple; no masses or thyroidomegaly Lungs: Respirations even and unlabored. Lungs clear to auscultation bilaterally.   No wheezes, crackles, or rhonchi.  Abdomen:  Soft, nondistended, nontender. Normal bowel sounds. No appreciable masses or hepatomegaly.  No rebound or guarding.  Neurologic:  Alert and oriented x3;  grossly normal neurologically. Skin:  Intact without significant lesions or rashes. Cervical Nodes:  No significant cervical adenopathy. Psych:  Alert and cooperative. Normal affect.  LAB RESULTS: Recent Labs    07/15/18 1502 07/16/18 0504  WBC 8.9 6.5  HGB 10.8* 9.3*  HCT 31.1* 27.8*  PLT 327 256   BMET Recent Labs    07/15/18 1502 07/16/18 0504  NA 136 139  K 2.6* 3.6  CL 100 107  CO2 23 25  GLUCOSE 99 74  BUN 15 14  CREATININE 0.95 0.71  CALCIUM 9.7 8.7*   LFT Recent Labs    07/15/18 1502  PROT 6.5  ALBUMIN 3.7  AST 13*  ALT <5  ALKPHOS 76  BILITOT 1.2   PT/INR No results for input(s): LABPROT, INR in the last 72 hours.  STUDIES: Dg Chest Portable 1  View  Result Date: 07/15/2018 CLINICAL DATA:  Progressive weakness over the past 2 months. EXAM: PORTABLE CHEST 1 VIEW COMPARISON:  None. FINDINGS: The cardiac silhouette, mediastinal and hilar contours are within normal limits. There is mild tortuosity and calcification of the thoracic aorta. The lungs are clear. No pleural effusions. No worrisome pulmonary lesions. The bony thorax is intact. IMPRESSION: No acute cardiopulmonary findings. Electronically Signed   By: Marijo Sanes M.D.   On: 07/15/2018 15:57      Impression / Plan:   Kathy Bean is a 68 y.o. y/o female with 1 month history  of abdominal pain and nausea with anemia but no sources of bleeding  Patient symptoms accompanied with weight loss are concerning  Her CT abdomen recently did not show any malignancy  There is no active evidence of GI bleeding.  Patient denies any signs of blood loss.  In addition her BUN is normal and in the setting of active upper GI bleed this would be expected to be high.  Patient will need upper and lower endoscopy as was previously planned by Dr. Alice Reichert, as an inpatient.  This was discussed with the patient and she is agreeable with proceeding.  prep orders placed  Continue PPI IV twice daily to heal any gastritis or underlying peptic ulcer disease or could be causing her symptoms.  However, there is no evidence that these are bleeding at this time if present.  Please page GI with any signs of active GI bleeding or changes in hemodynamic status.  Continue serial CBCs and transfuse PRN  I have discussed alternative options, risks & benefits,  which include, but are not limited to, bleeding, infection, perforation,respiratory complication & drug reaction.  The patient agrees with this plan & written consent will be obtained.     Thank you for involving me in the care of this patient.      LOS: 1 day   Virgel Manifold, MD  07/16/2018, 7:32 AM

## 2018-07-17 ENCOUNTER — Encounter
Admission: EM | Disposition: A | Discharge status: Skilled Nursing Facility | Payer: Self-pay | Source: Home / Self Care | Attending: Internal Medicine

## 2018-07-17 ENCOUNTER — Inpatient Hospital Stay: Payer: Medicare Other | Admitting: Anesthesiology

## 2018-07-17 ENCOUNTER — Encounter: Payer: Self-pay | Admitting: Anesthesiology

## 2018-07-17 DIAGNOSIS — R1013 Epigastric pain: Secondary | ICD-10-CM

## 2018-07-17 DIAGNOSIS — R634 Abnormal weight loss: Secondary | ICD-10-CM

## 2018-07-17 DIAGNOSIS — K644 Residual hemorrhoidal skin tags: Secondary | ICD-10-CM

## 2018-07-17 DIAGNOSIS — K317 Polyp of stomach and duodenum: Secondary | ICD-10-CM

## 2018-07-17 DIAGNOSIS — K6289 Other specified diseases of anus and rectum: Secondary | ICD-10-CM

## 2018-07-17 DIAGNOSIS — K5730 Diverticulosis of large intestine without perforation or abscess without bleeding: Secondary | ICD-10-CM

## 2018-07-17 HISTORY — PX: COLONOSCOPY: SHX5424

## 2018-07-17 HISTORY — PX: ESOPHAGOGASTRODUODENOSCOPY: SHX5428

## 2018-07-17 LAB — FOLATE: Folate: 10.3 ng/mL (ref >5.9)

## 2018-07-17 LAB — POTASSIUM: Potassium: 3 mmol/L — ABNORMAL LOW (ref 3.5–5.1)

## 2018-07-17 LAB — HEMOGLOBIN: Hemoglobin: 8.7 g/dL — ABNORMAL LOW (ref 12.0–15.0)

## 2018-07-17 SURGERY — EGD (ESOPHAGOGASTRODUODENOSCOPY)
Anesthesia: General | Laterality: Left

## 2018-07-17 MED ORDER — POTASSIUM CHLORIDE 10 MEQ/100ML IV SOLN
10.0000 meq | INTRAVENOUS | Status: AC
  Administered 2018-07-17 (×2): 10 meq via INTRAVENOUS
  Filled 2018-07-17: qty 100

## 2018-07-17 MED ORDER — PHENYLEPHRINE HCL (PRESSORS) 10 MG/ML IV SOLN
INTRAVENOUS | Status: DC | PRN
  Administered 2018-07-17: 100 ug via INTRAVENOUS

## 2018-07-17 MED ORDER — POTASSIUM CHLORIDE CRYS ER 10 MEQ PO TBCR: 10.0000 meq | EXTENDED_RELEASE_TABLET | Freq: Two times a day (BID) | ORAL | Status: DC

## 2018-07-17 MED ORDER — PROPOFOL 10 MG/ML IV BOLUS
INTRAVENOUS | Status: DC | PRN
  Administered 2018-07-17: 40 mg via INTRAVENOUS

## 2018-07-17 MED ORDER — POTASSIUM CHLORIDE CRYS ER 20 MEQ PO TBCR
40.0000 meq | EXTENDED_RELEASE_TABLET | Freq: Once | ORAL | Status: AC
  Administered 2018-07-17: 40 meq via ORAL
  Filled 2018-07-17: qty 2

## 2018-07-17 MED ORDER — ENSURE ENLIVE PO LIQD
237.0000 mL | Freq: Two times a day (BID) | ORAL | Status: DC
  Administered 2018-07-18 (×2): 237 mL via ORAL

## 2018-07-17 MED ORDER — LACTATED RINGERS IV SOLN
INTRAVENOUS | Status: DC | PRN
  Administered 2018-07-17: 12:00:00 via INTRAVENOUS

## 2018-07-17 MED ORDER — PROPOFOL 500 MG/50ML IV EMUL
INTRAVENOUS | Status: DC | PRN
  Administered 2018-07-17: 175 ug/kg/min via INTRAVENOUS

## 2018-07-17 MED ORDER — LIDOCAINE HCL (CARDIAC) PF 100 MG/5ML IV SOSY
PREFILLED_SYRINGE | INTRAVENOUS | Status: DC | PRN
  Administered 2018-07-17: 60 mg via INTRAVENOUS

## 2018-07-17 MED ORDER — SODIUM CHLORIDE 0.9 % IV SOLN
INTRAVENOUS | Status: DC
  Administered 2018-07-17: 05:00:00 via INTRAVENOUS

## 2018-07-17 NOTE — Progress Notes (Signed)
Report given to [employer].  Also 15 minute call to floor.

## 2018-07-17 NOTE — Op Note (Signed)
[facility] Gastroenterology Patient Name: Kathy Bean Procedure Date: 07/17/2018 12:06 PM MRN: [id number] Account #: [id number] Date of Birth: [birth year] Admit Type: Inpatient Age: 68 Room: [facility] ENDO ROOM 4 Gender: Female Note Status: Finalized Procedure:            Upper GI endoscopy Indications:          Epigastric abdominal pain Providers:            Lilana Blasko B. Bonna Gains MD, MD Referring MD:         Rusty Aus, MD (Referring MD) Medicines:            Monitored Anesthesia Care Complications:        No immediate complications. Procedure:            Pre-Anesthesia Assessment:                       - Prior to the procedure, a History and Physical was                        performed, and patient medications and allergies were                        reviewed. The patient is competent. The risks and                        benefits of the procedure and the sedation options and                        risks were discussed with the patient. All questions                        were answered and informed consent was obtained.                        Patient identification and proposed procedure were                        verified by the physician, the nurse, the                        anesthesiologist, the anesthetist and the technician in                        the pre-procedure area in the procedure room in the                        endoscopy suite. Mental Status Examination: alert and                        oriented. Airway Examination: normal oropharyngeal                        airway and neck mobility. Respiratory Examination:                        clear to auscultation. CV Examination: normal.                        Prophylactic Antibiotics: The patient does not require  prophylactic antibiotics. Prior Anticoagulants: The                        patient has taken no previous anticoagulant or                        antiplatelet agents.  ASA Grade Assessment: II - A                        patient with mild systemic disease. After reviewing the                        risks and benefits, the patient was deemed in                        satisfactory condition to undergo the procedure. The                        anesthesia plan was to use monitored anesthesia care                        (MAC). Immediately prior to administration of                        medications, the patient was re-assessed for adequacy                        to receive sedatives. The heart rate, respiratory rate,                        oxygen saturations, blood pressure, adequacy of                        pulmonary ventilation, and response to care were                        monitored throughout the procedure. The physical status                        of the patient was re-assessed after the procedure.                       After obtaining informed consent, the endoscope was                        passed under direct vision. Throughout the procedure,                        the patient's blood pressure, pulse, and oxygen                        saturations were monitored continuously. The Endoscope                        was introduced through the mouth, and advanced to the                        second part of duodenum. The upper GI endoscopy was  accomplished with ease. The patient tolerated the                        procedure well. Findings:      The examined esophagus was normal.      A few 3 to 4 mm sessile polyps with no bleeding and no stigmata of       recent bleeding were found in the gastric fundus. Biopsies were taken       with a cold forceps for histology.      The exam of the stomach was otherwise normal.      The examined duodenum was normal. Impression:           - Normal esophagus.                       - A few gastric polyps. Biopsied.                       - Normal examined duodenum. Recommendation:       -  Perform a colonoscopy today.                       - Continue present medications.                       - The findings and recommendations were discussed with                        the patient.                       - Return to GI clinic in 2 weeks. Procedure Code(s):    --- Professional ---                       [phone], Esophagogastroduodenoscopy, flexible, transoral;                        with biopsy, single or multiple Diagnosis Code(s):    --- Professional ---                       K31.7, Polyp of stomach and duodenum                       R10.13, Epigastric pain CPT copyright 2019 American Medical Association. All rights reserved. The codes documented in this report are preliminary and upon coder review may  be revised to meet current compliance requirements.  Vonda Antigua, MD Margretta Sidle B. Bonna Gains MD, MD 07/17/2018 12:22:12 PM This report has been signed electronically. Number of Addenda: 0 Note Initiated On: 07/17/2018 12:06 PM Estimated Blood Loss: Estimated blood loss: none.      [facility]

## 2018-07-17 NOTE — Progress Notes (Signed)
I informed patient of her procedure results in detail.  Patient states her nausea symptoms seem to have started in February 2020 after her Parkinson medication dosage was increased.  She states her symptoms did not exist prior to that.  Nausea is listed as 1 of the adverse effects the medication.  I have advised the patient to talk to the provider who is prescribing this on an outpatient basis to consider changes in medication.  I have informed Dr. Darvin Neighbours in this regard as well to address this as an inpatient as appropriate.   Pt denies any depression and does not attribute her symptoms to that.   Would recommend re-evaluating her meds Continue anemia workup  Dr. Vicente Males to follow from tomorrow

## 2018-07-17 NOTE — Progress Notes (Signed)
[facility] [place] NAME: Kathy Bean    MR#:  [id number]  DATE OF BIRTH:  04/18/1950  SUBJECTIVE:  CHIEF COMPLAINT:   Chief Complaint  Patient presents with  . Weakness  . Nausea   Generalized weakness and nausea persist. No abdominal pain.  REVIEW OF SYSTEMS:    Review of Systems  Constitutional: Positive for malaise/fatigue. Negative for chills and fever.  HENT: Negative for sore throat.   Eyes: Negative for blurred vision, double vision and pain.  Respiratory: Negative for cough, hemoptysis, shortness of breath and wheezing.   Cardiovascular: Negative for chest pain, palpitations, orthopnea and leg swelling.  Gastrointestinal: Positive for nausea. Negative for abdominal pain, constipation, diarrhea, heartburn and vomiting.  Genitourinary: Negative for dysuria and hematuria.  Musculoskeletal: Negative for back pain and joint pain.  Skin: Negative for rash.  Neurological: Negative for sensory change, speech change, focal weakness and headaches.  Endo/Heme/Allergies: Does not bruise/bleed easily.  Psychiatric/Behavioral: Negative for depression. The patient is not nervous/anxious.     DRUG ALLERGIES:  No Known Allergies  VITALS:  Blood pressure (!) 130/99, pulse (!) 107, temperature 98.2 F (36.8 C), temperature source Oral, resp. rate 18, height 5\' 8"  (1.727 m), weight 70.7 kg, SpO2 98 %.  PHYSICAL EXAMINATION:   Physical Exam  GENERAL:  68 y.o.-year-old patient lying in the bed with no acute distress.  EYES: Pupils equal, round, reactive to light and accommodation. No scleral icterus. Extraocular muscles intact.  HEENT: Head atraumatic, normocephalic. Oropharynx and nasopharynx clear.  NECK:  Supple, no jugular venous distention. No thyroid enlargement, no tenderness.  LUNGS: Normal breath sounds bilaterally, no wheezing, rales, rhonchi. No use of accessory muscles of respiration.  CARDIOVASCULAR: S1, S2 normal. No  murmurs, rubs, or gallops.  ABDOMEN: Soft, nontender, nondistended. Bowel sounds present. No organomegaly or mass.  EXTREMITIES: No cyanosis, clubbing or edema b/l.    NEUROLOGIC: Cranial nerves II through XII are intact. No focal Motor or sensory deficits b/l.   PSYCHIATRIC: The patient is alert and oriented x 3.  SKIN: No obvious rash, lesion, or ulcer.   LABORATORY PANEL:   CBC Recent Labs  Lab 07/16/18 0504 07/17/18 0436  WBC 6.5  --   HGB 9.3* 8.7*  HCT 27.8*  --   PLT 256  --    ------------------------------------------------------------------------------------------------------------------ Chemistries  Recent Labs  Lab 07/15/18 1502 07/16/18 0504 07/17/18 0436  NA 136 139  --   K 2.6* 3.6 3.0*  CL 100 107  --   CO2 23 25  --   GLUCOSE 99 74  --   BUN 15 14  --   CREATININE 0.95 0.71  --   CALCIUM 9.7 8.7*  --   MG 1.8  --   --   AST 13*  --   --   ALT <5  --   --   ALKPHOS 76  --   --   BILITOT 1.2  --   --    ------------------------------------------------------------------------------------------------------------------  Cardiac Enzymes Recent Labs  Lab 07/15/18 1502  TROPONINI <0.03   ------------------------------------------------------------------------------------------------------------------  RADIOLOGY:  Dg Chest Portable 1 View  Result Date: 07/15/2018 CLINICAL DATA:  Progressive weakness over the past 2 months. EXAM: PORTABLE CHEST 1 VIEW COMPARISON:  None. FINDINGS: The cardiac silhouette, mediastinal and hilar contours are within normal limits. There is mild tortuosity and calcification of the thoracic aorta. The lungs are clear. No pleural effusions. No worrisome pulmonary lesions. The bony thorax is  intact. IMPRESSION: No acute cardiopulmonary findings. Electronically Signed   By: Marijo Sanes M.D.   On: 07/15/2018 15:57     ASSESSMENT AND PLAN:   *Acute blood loss anemia.  Upper GI bleed suspected with her symptoms. Monitor  hemoglobin.  Transfuse if hemoglobin less than 7 or patient is symptomatic. Protonix started GI consulted.  EGD scheduled for today N.p.o. for procedure Hemoglobin has dropped further today.  *Dehydration due to decreased oral intake.  IV fluids started on admission have been stopped.  Resolved.  *Hypokalemia due to decreased oral intake.   Replaced and now improved.  Today 3 and will replace orally  * Parkinson's disease - Sinemet restarted  All the records are reviewed and case discussed with Care Management/Social Worker. Management plans discussed with the patient, family and they are in agreement.  CODE STATUS: DNR/DNI  DVT Prophylaxis: SCDs  TOTAL TIME TAKING CARE OF THIS PATIENT: 35 minutes.   POSSIBLE D/C IN 1-2 DAYS, DEPENDING ON CLINICAL CONDITION.  Kathy Bean M.D on 07/17/2018 at 12:16 PM  Between 7am to 6pm - Pager - [phone]  After 6pm go to [email or web address] - password [email or web address] [facility]  Office  [phone]  CC: Primary care physician; Rusty Aus, MD  Note: This dictation was prepared with Dragon dictation along with smaller phrase technology. Any transcriptional errors that result from this process are unintentional.

## 2018-07-17 NOTE — Transfer of Care (Signed)
Immediate Anesthesia Transfer of Care Note  Patient: Kathy Bean  Procedure(s) Performed: ESOPHAGOGASTRODUODENOSCOPY (EGD) (Left ) COLONOSCOPY (Left )  Patient Location: PACU  Anesthesia Type:General  Level of Consciousness: sedated  Airway & Oxygen Therapy: Patient Spontanous Breathing and Patient connected to nasal cannula oxygen  Post-op Assessment: Report given to RN and Post -op Vital signs reviewed and stable  Post vital signs: Reviewed and stable  Last Vitals:  Vitals Value Taken Time  BP 102/66 07/17/2018 12:49 PM  Temp    Pulse 71 07/17/2018 12:49 PM  Resp 21 07/17/2018 12:49 PM  SpO2 100 % 07/17/2018 12:49 PM  Vitals shown include unvalidated device data.  Last Pain:  Vitals:   07/17/18 0800  TempSrc:   PainSc: 0-No pain         Complications: No apparent anesthesia complications

## 2018-07-17 NOTE — Op Note (Addendum)
[facility] Gastroenterology Patient Name: Kathy Bean Procedure Date: 07/17/2018 12:05 PM MRN: [id number] Account #: [id number] Date of Birth: [birth year] Admit Type: Inpatient Age: 68 Room: [facility] ENDO ROOM 4 Gender: Female Note Status: Finalized Procedure:            Colonoscopy Indications:          Epigastric abdominal pain, Weight loss Providers:            Donie Moulton B. Bonna Gains MD, MD Referring MD:         Rusty Aus, MD (Referring MD) Medicines:            Monitored Anesthesia Care Complications:        No immediate complications. Procedure:            Pre-Anesthesia Assessment:                       - Prior to the procedure, a History and Physical was                        performed, and patient medications, allergies and                        sensitivities were reviewed. The patient's tolerance of                        previous anesthesia was reviewed.                       - The risks and benefits of the procedure and the                        sedation options and risks were discussed with the                        patient. All questions were answered and informed                        consent was obtained.                       - Patient identification and proposed procedure were                        verified prior to the procedure by the physician, the                        nurse, the anesthetist and the technician. The                        procedure was verified in the pre-procedure area in the                        procedure room in the endoscopy suite.                       - ASA Grade Assessment: II - A patient with mild                        systemic disease.                       -  After reviewing the risks and benefits, the patient                        was deemed in satisfactory condition to undergo the                        procedure.                       After obtaining informed consent, the colonoscope was           passed under direct vision. Throughout the procedure,                        the patient's blood pressure, pulse, and oxygen                        saturations were monitored continuously. The                        Colonoscope was introduced through the anus and                        advanced to the the cecum, identified by appendiceal                        orifice and ileocecal valve. The colonoscopy was                        performed with ease. The patient tolerated the                        procedure well. The quality of the bowel preparation                        was good. Findings:      The perianal exam findings include non-thrombosed external hemorrhoids.      Multiple diverticula were found in the sigmoid colon.      The exam was otherwise without abnormality.      The rectum, sigmoid colon, descending colon, transverse colon, ascending       colon and cecum appeared normal.      Non-bleeding internal hemorrhoids were found during retroflexion.      Anal papilla(e) were hypertrophied. Impression:           - Non-thrombosed external hemorrhoids found on perianal                        exam.                       - Diverticulosis in the sigmoid colon.                       - The examination was otherwise normal.                       - The rectum, sigmoid colon, descending colon,                        transverse colon, ascending colon and cecum are normal.                       -  Non-bleeding internal hemorrhoids.                       - Anal papilla(e) were hypertrophied.                       - No specimens collected. Recommendation:       - Patient's source of symptoms may be related to her                        depression and loss of appetite due to this. CT Abdomen                        pelvis and endoscopy today have been unrevealing of any                        significant pathology in the GI tract.                       Would recommend H Pylori serology  - ordered                       Workup for anemia with Vit B12 and folate levels and                        consider hematology consult                       - Resume previous diet.                       - Continue present medications.                       - Repeat colonoscopy in 5 years for screening purposes.                       - Return to primary care physician as previously                        scheduled.                       - The findings and recommendations were discussed with                        the patient.                       - The findings and recommendations were discussed with                        the patient's family.                       - High fiber diet. Procedure Code(s):    --- Professional ---                       [phone], Colonoscopy, flexible; diagnostic, including                        collection of specimen(s) by brushing or washing, when  performed (separate procedure) Diagnosis Code(s):    --- Professional ---                       K64.4, Residual hemorrhoidal skin tags                       K64.8, Other hemorrhoids                       K62.89, Other specified diseases of anus and rectum                       R10.13, Epigastric pain                       R63.4, Abnormal weight loss                       K57.30, Diverticulosis of large intestine without                        perforation or abscess without bleeding CPT copyright 2019 American Medical Association. All rights reserved. The codes documented in this report are preliminary and upon coder review may  be revised to meet current compliance requirements.  Vonda Antigua, MD Margretta Sidle B. Bonna Gains MD, MD 07/17/2018 12:49:08 PM This report has been signed electronically. Number of Addenda: 0 Note Initiated On: 07/17/2018 12:05 PM Scope Withdrawal Time: 0 hours 8 minutes 53 seconds  Total Procedure Duration: 0 hours 17 minutes 15 seconds       [facility]

## 2018-07-17 NOTE — Progress Notes (Signed)
Initial Nutrition Assessment  RD working remotely.  DOCUMENTATION CODES:   Not applicable  INTERVENTION:  Provide Ensure Enlive po BID, each supplement provides 350 kcal and 20 grams of protein.  NUTRITION DIAGNOSIS:   Inadequate oral intake related to decreased appetite as evidenced by per patient/family report.  GOAL:   Patient will meet greater than or equal to 90% of their needs  MONITOR:   PO intake, Supplement acceptance, Labs, Weight trends, I & O's  REASON FOR ASSESSMENT:   Malnutrition Screening Tool, Consult Poor PO  ASSESSMENT:   68 year old female with PMHx of OA, depression, Parkinson's disease, HTN,hx left breast cancer s/p partial mastectomy admitted with acute blood loss anemia, dehydration.   -Patient underwent EGD today. Found normal esophagus, normal duodenum, and a few gastric polyps that were biopsied. -Also underwent colonoscopy today. Findings include non-thrombosed hemorroids and diverticulosis.  Spoke with patient over the phone. She reports she has had a decreased appetite and intake but is unsure for how long. She has been afraid to eat because she thought she had an ulcer. She has not been eating any meals and has been mainly drinking liquids. She reports she feels like she can eat today so she is going to order some dinner. She is also amenable to drinking ONS to help meet calorie/protein needs.  Patient reports she has lost about 15 lbs. When asked what her UBW was, she reports "15 lbs higher." Per chart she was 81.6 kg on 06/25/2018. She is currently 70.7 kg (155.87 lbs). She has lost 10.9 kg (13.4% body weight) over the past months, which is significant for time frame.  Medications reviewed and include: Sinemet, folic acid 1 mg daily, Reglan 5 mg TID, MVI daily, pantoprazole, potassium chloride 10 mEq BID, thiamine 100 mg daily, vitamin B12 1000 micrograms daily.  Labs reviewed: Potassium 3.  Patient is at risk for malnutrition.  NUTRITION -  FOCUSED PHYSICAL EXAM:  Unable to complete at this time.  Diet Order:   Diet Order            Diet NPO time specified Except for: Sips with Meds  Diet effective 0500        Diet regular Room service appropriate? Yes; Fluid consistency: Thin  Diet effective now             EDUCATION NEEDS:   No education needs have been identified at this time  Skin:  Skin Assessment: Reviewed RN Assessment  Last BM:  07/16/2018 per chart  Height:   Ht Readings from Last 1 Encounters:  07/15/18 5\' 8"  (1.727 m)   Weight:   Wt Readings from Last 1 Encounters:  07/15/18 70.7 kg   Ideal Body Weight:  63.6 kg  BMI:  Body mass index is 23.7 kg/m.  Estimated Nutritional Needs:   Kcal:  1600-1800  Protein:  85-95 grams  Fluid:  1.6-1.8 L/day  Willey Blade, MS, RD, LDN Office: [phone] Pager: [phone] After Hours/Weekend Pager: [phone]

## 2018-07-17 NOTE — Anesthesia Postprocedure Evaluation (Signed)
Anesthesia Post Note  Patient: Kathy Bean  Procedure(s) Performed: ESOPHAGOGASTRODUODENOSCOPY (EGD) (Left ) COLONOSCOPY (Left )  Patient location during evaluation: PACU Anesthesia Type: General Level of consciousness: awake and alert Pain management: pain level controlled Vital Signs Assessment: post-procedure vital signs reviewed and stable Respiratory status: spontaneous breathing, nonlabored ventilation, respiratory function stable and patient connected to nasal cannula oxygen Cardiovascular status: blood pressure returned to baseline and stable Postop Assessment: no apparent nausea or vomiting Anesthetic complications: no     Last Vitals:  Vitals:   07/17/18 1312 07/17/18 1357  BP: 103/64 114/80  Pulse: 64 77  Resp: 15 17  Temp:  36.9 C  SpO2: 100% 97%    Last Pain:  Vitals:   07/17/18 1357  TempSrc: Oral  PainSc:                  Precious Haws Quetzali Heinle

## 2018-07-17 NOTE — Progress Notes (Signed)
MD Sudini made aware of pt's potassium level of 3, awaiting on answer from MD.

## 2018-07-17 NOTE — Anesthesia Post-op Follow-up Note (Signed)
Anesthesia QCDR form completed.        

## 2018-07-18 DIAGNOSIS — R634 Abnormal weight loss: Secondary | ICD-10-CM

## 2018-07-18 DIAGNOSIS — D649 Anemia, unspecified: Secondary | ICD-10-CM

## 2018-07-18 LAB — BASIC METABOLIC PANEL
Anion gap: 8 (ref 5–15)
BUN: 11 mg/dL (ref 8–23)
CO2: 22 mmol/L (ref 22–32)
Calcium: 8.4 mg/dL — ABNORMAL LOW (ref 8.9–10.3)
Chloride: 109 mmol/L (ref 98–111)
Creatinine, Ser: 0.65 mg/dL (ref 0.44–1.00)
GFR calc Af Amer: >60 mL/min (ref >60)
GFR calc non Af Amer: >60 mL/min (ref >60)
Glucose, Bld: 72 mg/dL (ref 70–99)
Potassium: 3.2 mmol/L — ABNORMAL LOW (ref 3.5–5.1)
Sodium: 139 mmol/L (ref 135–145)

## 2018-07-18 LAB — T4, FREE: Free T4: 1.19 ng/dL (ref 0.82–1.77)

## 2018-07-18 LAB — VITAMIN B12: Vitamin B-12: 2192 pg/mL — ABNORMAL HIGH (ref 180–914)

## 2018-07-18 LAB — HEMOGLOBIN: Hemoglobin: 7.8 g/dL — ABNORMAL LOW (ref 12.0–15.0)

## 2018-07-18 MED ORDER — POTASSIUM CHLORIDE CRYS ER 20 MEQ PO TBCR
40.0000 meq | EXTENDED_RELEASE_TABLET | Freq: Once | ORAL | Status: AC
  Administered 2018-07-18: 40 meq via ORAL
  Filled 2018-07-18: qty 2

## 2018-07-18 NOTE — TOC Progression Note (Signed)
Transition of Care [facility]) - Progression Note    Patient Details  Name: Kathy Bean MRN: [id number] Date of Birth: 11/20/1950  Transition of Care [facility]) CM/SW Contact  Shela Leff, [place] Phone Number: 07/18/2018, 3:19 PM  Clinical Narrative:   CSW informed that patient and husband had changed their minds about 2:15 when the nurse went in to do the discharge and now they are wanting placement at [facility]. Patient's husband used to be Forensic psychologist for 14 years so [facility] stated that they would take her. Discharge information sent. Nurse to call report and patient's husband to transport.      Barriers to Discharge: No Barriers Identified  Expected Discharge Plan and Services           Expected Discharge Date: 07/18/18                           [facility] Agency: [facility] Date [place]: 07/18/18 Time [place]: [phone] Representative spoke with at [place]: Helene Kelp)   Social Determinants of Health ([place]) Interventions    Readmission Risk Interventions Readmission Risk Prevention Plan 07/18/2018  Transportation Screening Complete  PCP or Specialist Appt within 5-7 Days Complete  Home Care Screening Complete  Medication Review (RN CM) Complete  Some recent data might be hidden

## 2018-07-18 NOTE — TOC Transition Note (Signed)
Transition of Care [facility]) - CM/SW Discharge Note   Patient Details  Name: Kathy Bean MRN: [id number] Date of Birth: 1950-11-26  Transition of [facility]) CM/SW Contact:  Shela Leff, LCSW Phone Number: 07/18/2018, 2:03 PM   Clinical Narrative:   Patient discharging today with home health. When CSW went to speak with patient she asked CSW to contact her husband as she sometimes forgets important information. CSW contacted [employer], patient's husband via phone. Mr. Sonnen informed CSW that he is her primary caregiver and that she has required assistance with ADL's for some time now. He wanted to ensure patient could ambulate and CSW provided information from PT note. He was satisfied with this and is in agreement to take patient back home. CSW provided verbal list of home health agencies and he stated that they had used [facility] before and would like to use them again. CSW has made referral to [facility] [place].    Final next level of care: Home w Home Health Services Barriers to Discharge: No Barriers Identified   Patient Goals and CMS Choice   CMS Medicare.gov Compare Post Acute Care list provided to:: Patient Represenative (must comment) Choice offered to / list presented to : Spouse  Discharge Placement                       Discharge Plan and Services                            [place]: [facility] ([facility]) Date [place]: 07/18/18 Time [place]: [phone] Representative spoke with at [place]: Helene Kelp)  Social Determinants of Health ([place]) Interventions     Readmission Risk Interventions Readmission Risk Prevention Plan 07/18/2018  Transportation Screening Complete  PCP or Specialist Appt within 5-7 Days Complete  Home Care Screening Complete  Medication Review (RN CM) Complete  Some recent data might be hidden

## 2018-07-18 NOTE — NC FL2 (Signed)
[place] LEVEL OF CARE SCREENING TOOL     IDENTIFICATION  Patient Name: Kathy Bean Birthdate: 1950/04/02 Sex: female Admission Date (Current Location): 07/15/2018  [place] and [place] Number:  Engineering geologist and Address:  [facility], [address], [place], [place] [address]      Provider Number: [id number]  Attending Physician Name and Address:  Bettey Costa, MD  Relative Name and Phone Number:       Current Level of Care: Hospital Recommended Level of Care: [place] Prior Approval Number:    Date Approved/Denied:   PASRR Number:    Discharge Plan: SNF    Current Diagnoses: Patient Active Problem List   Diagnosis Date Noted  . External hemorrhoids   . Hypertrophy of anal papillae   . Loss of weight   . Diverticulosis of large intestine without diverticulitis   . Dehydration 07/15/2018  . Primary cancer of lower-inner quadrant of left female breast ([place]) 12/04/2014    Orientation RESPIRATION BLADDER Height & Weight     Self, Time, Place, Situation  Normal Continent Weight: 155 lb 13.8 oz (70.7 kg) Height:  5\' 8"  (172.7 cm)  BEHAVIORAL SYMPTOMS/MOOD NEUROLOGICAL BOWEL NUTRITION STATUS  (none) Convulsions/Seizures Continent Diet(heart healthy)  AMBULATORY STATUS COMMUNICATION OF NEEDS Skin   Supervision Verbally Normal                       Personal Care Assistance Level of Assistance  Bathing, Feeding, Dressing Bathing Assistance: Limited assistance Feeding assistance: Independent Dressing Assistance: Limited assistance     Functional Limitations Info  (no issues)          SPECIAL CARE FACTORS FREQUENCY  PT (By licensed PT)                    Contractures Contractures Info: Not present    Additional Factors Info  Code Status Code Status Info: dnr             Current Medications (07/18/2018):  This is the current hospital active medication list Current  Facility-Administered Medications  Medication Dose Route Frequency Provider Last Rate Last Dose  . acetaminophen (TYLENOL) tablet 650 mg  650 mg Oral Q6H PRN Seals, Theo Dills, NP   650 mg at 07/18/18 3557   Or  . acetaminophen (TYLENOL) suppository 650 mg  650 mg Rectal Q6H PRN Seals, Theo Dills, NP      . carbidopa-levodopa (SINEMET CR) 50-200 MG per tablet controlled release 1 tablet  1 tablet Oral QHS Seals, Theo Dills, NP   1 tablet at 07/16/18 2112  . carbidopa-levodopa (SINEMET IR) 25-100 MG per tablet immediate release 2 tablet  2 tablet Oral TID Mayer Camel, NP   2 tablet at 07/18/18 0951  . citalopram (CELEXA) tablet 10 mg  10 mg Oral QHS Seals, Angela H, NP   10 mg at 07/17/18 2109  . feeding supplement (ENSURE ENLIVE) (ENSURE ENLIVE) liquid 237 mL  237 mL Oral BID BM Sudini, Srikar, MD   237 mL at 07/18/18 1006  . folic acid (FOLVITE) tablet 1 mg  1 mg Oral Daily Seals, Theo Dills, NP   1 mg at 07/18/18 3220  . metoCLOPramide (REGLAN) tablet 5 mg  5 mg Oral TID Gardiner Barefoot H, NP   5 mg at 07/18/18 2542  . multivitamin with minerals tablet 1 tablet  1 tablet Oral Daily Seals, Theo Dills, NP   1 tablet at  07/18/18 4967  . ondansetron (ZOFRAN) tablet 4 mg  4 mg Oral Q6H PRN Seals, Levada Dy H, NP   4 mg at 07/16/18 2115   Or  . ondansetron (ZOFRAN) injection 4 mg  4 mg Intravenous Q6H PRN Gardiner Barefoot H, NP   4 mg at 07/17/18 0819  . pantoprazole (PROTONIX) injection 40 mg  40 mg Intravenous Q12H Seals, Angela H, NP   40 mg at 07/18/18 5916  . polyethylene glycol (MIRALAX / GLYCOLAX) packet 17 g  17 g Oral Daily PRN Seals, Levada Dy H, NP      . sodium chloride flush (NS) 0.9 % injection 3 mL  3 mL Intravenous Q12H Seals, Angela H, NP   3 mL at 07/18/18 0959  . thiamine (VITAMIN B-1) tablet 100 mg  100 mg Oral Daily Seals, Angela H, NP   100 mg at 07/18/18 3846  . traZODone (DESYREL) tablet 25 mg  25 mg Oral QHS PRN Mayer Camel, NP   25 mg at 07/17/18 2110  . vitamin B-12 (CYANOCOBALAMIN)  tablet 1,000 mcg  1,000 mcg Oral Daily Seals, Theo Dills, NP   1,000 mcg at 07/18/18 6599     Discharge Medications: Please see discharge summary for a list of discharge medications.  Relevant Imaging Results:  Relevant Lab Results:   Additional Information    Shela Leff, LCSW

## 2018-07-18 NOTE — Care Management Important Message (Signed)
Important Message  Patient Details  Name: Kathy Bean MRN: [id number] Date of Birth: Apr 27, 1950   Medicare Important Message Given:  Yes    Dannette Barbara 07/18/2018, 11:42 AM

## 2018-07-18 NOTE — Progress Notes (Signed)
MD notified: Kathy Bean, the patient had and EGD and colonoscopy done yesterday. Hemogloblin level keeps dropping. Yesterday it was 8.7 and today is 7.8. also potassium is 3.2 incase you want to order any additional potassium replacement.

## 2018-07-18 NOTE — Evaluation (Signed)
Clinical/Bedside Swallow Evaluation Patient Details  Name: Kathy Bean MRN: [id number] Date of Birth: [birth year]  Today's Date: 07/18/2018 Time: SLP Start Time (ACUTE ONLY): 68 SLP Stop Time (ACUTE ONLY): 0949 SLP Time Calculation (min) (ACUTE ONLY): 34 min  Past Medical History:  Past Medical History:  Diagnosis Date  . Breast cancer ([place]) 11/2014   left  . Cancer [facility]) 2016   left breast  . Depression   . Dysrhythmia    heart racing  . Family history of adverse reaction to anesthesia    brother had difficulty going under anesthseia  . Headache    hx of migraines   period induced  . Hypertension   . Osteoarthritis   . Parkinson disease [facility])    Past Surgical History:  Past Surgical History:  Procedure Laterality Date  . BREAST BIOPSY Left 10/2014   positive  . BREAST EXCISIONAL BIOPSY Left 11/29/2014   invasive mam ca +  . COLONOSCOPY Left 07/17/2018   Procedure: COLONOSCOPY;  Surgeon: Virgel Manifold, MD;  Location: [facility] ENDOSCOPY;  Service: Endoscopy;  Laterality: Left;  . DILATION AND CURETTAGE OF UTERUS    . ESOPHAGOGASTRODUODENOSCOPY Left 07/17/2018   Procedure: ESOPHAGOGASTRODUODENOSCOPY (EGD);  Surgeon: Virgel Manifold, MD;  Location: [facility] ENDOSCOPY;  Service: Endoscopy;  Laterality: Left;  . Laminectomy Posterior Cervicle Decomp W/Facetectomy & Foraminotomy     [id number] x 3...Marland KitchenMarland KitchenEXCISION CENTRAL LEFT [place] HERNIATION L 2-3; Surgeon: Loni Dolly, MD; Location: [place]; Service: Orthopedics; Laterality: Left  . Laminectomy Posterior Lumbar Facetectomy & Foraminotomy W/Decomp     BILATERAL LUMBAR DECOMPRESSION L2-3, L3-4, L4-5; Surgeon: Loni Dolly, MD; Location: [place]; Service: Orthopedics; Laterality: Bilateral;  . PARTIAL MASTECTOMY WITH NEEDLE LOCALIZATION Left 11/29/2014   Procedure: PARTIAL MASTECTOMY WITH NEEDLE LOCALIZATION;  Surgeon: Leonie Green, MD;  Location: [facility] ORS;  Service: General;  Laterality: Left;  . SENTINEL NODE  BIOPSY Left 11/29/2014   Procedure: SENTINEL NODE BIOPSY;  Surgeon: Leonie Green, MD;  Location: [facility] ORS;  Service: General;  Laterality: Left;   HPI:  Per admitting H&P: Kathy Bean  is a 68 y.o. female with a known history of hypertension, Parkinson's disease, migraine headaches, depression, and a history of left breast cancer.  She presented to the emergency room complaining of increasing generalized weakness over the last 6 to 8 weeks as well as being "unable to eat ".  She reports that she no longer has an appetite and finds it difficult to eat as she does not want the food.  She has experienced nausea when attempting to force herself to eat at times.  However, patient denies experiencing vomiting.  She has noted some nausea with dry heaves in the morning when she wakes up.  She denies hematemesis, hematochezia, or melena.  She denies generalized abdominal pain.  However, she endorses tenderness in her epigastric area when she palpates the area.  She reportedly has seen Dr. Alice Reichert recently and planning endoscopy procedure.  Patient tells me she is not experiencing difficulty swallowing however, then again she also tells me her food has gotten stuck at times.  She denies a prior history of peptic ulcer disease or GI bleed.  She denies constipation or diarrhea.  Her last bowel movement was yesterday.  She denies chest pain, palpitations, shortness of breath, fever, chills.   Assessment / Plan / Recommendation Clinical Impression  Patient appears to present with functional swallowing abilities at bedside. No overt s/s aspiration observed with any consistency tested (puree, solid,  thin liquid). Oral phase and oral mech exam WFL. Adequate oral prep/coordination and A-P transit time with all consistencies. No oral residue observed. No overt s/s pharyngeal dysphagia observed. Swallow initiation appeared timely. Vocal quality remained clear thoughout evaluation. Pt denies any any s/s aspiration with  current regular diet. Educated pt re: aspiration precautions and general safe swallow recommendations. Pt stated agreement and understanding. Recommend continue with current regular diet with thin liquids, may continue to give meds whole with thin liquid. Discussed results of evaluation with nursing, nursing in agreement. SLP to sign off at this time, no needs identified. Please re-consult if pt presents with change in status requiring reassessment. Thank you for this consult. SLP Visit Diagnosis: Dysphagia, unspecified (R13.10)    Aspiration Risk  Mild aspiration risk    Diet Recommendation Regular;Thin liquid   Liquid Administration via: Cup Medication Administration: Whole meds with liquid Supervision: Patient able to self feed Compensations: Minimize environmental distractions;Slow rate;Small sips/bites Postural Changes: Seated upright at 90 degrees    Other  Recommendations Oral Care Recommendations: Oral care before and after PO   Follow up Recommendations None      Frequency and Duration            Prognosis Prognosis for Safe Diet Advancement: Good      Swallow Study   General Date of Onset: 07/16/18 HPI: Per admitting H&P: Kathy Bean  is a 68 y.o. female with a known history of hypertension, Parkinson's disease, migraine headaches, depression, and a history of left breast cancer.  She presented to the emergency room complaining of increasing generalized weakness over the last 6 to 8 weeks as well as being "unable to eat ".  She reports that she no longer has an appetite and finds it difficult to eat as she does not want the food.  She has experienced nausea when attempting to force herself to eat at times.  However, patient denies experiencing vomiting.  She has noted some nausea with dry heaves in the morning when she wakes up.  She denies hematemesis, hematochezia, or melena.  She denies generalized abdominal pain.  However, she endorses tenderness in her epigastric area  when she palpates the area.  She reportedly has seen Dr. Alice Reichert recently and planning endoscopy procedure.  Patient tells me she is not experiencing difficulty swallowing however, then again she also tells me her food has gotten stuck at times.  She denies a prior history of peptic ulcer disease or GI bleed.  She denies constipation or diarrhea.  Her last bowel movement was yesterday.  She denies chest pain, palpitations, shortness of breath, fever, chills. Type of Study: Bedside Swallow Evaluation Diet Prior to this Study: Regular;Thin liquids Temperature Spikes Noted: No Respiratory Status: Room air History of Recent Intubation: No Behavior/Cognition: Alert;Cooperative;Pleasant mood Oral Cavity Assessment: Within Functional Limits Oral Care Completed by SLP: No Oral Cavity - Dentition: Adequate natural dentition Vision: Functional for self-feeding Self-Feeding Abilities: Able to feed self Patient Positioning: Upright in bed Baseline Vocal Quality: Low vocal intensity Volitional Cough: Weak Volitional Swallow: Able to elicit    Oral/Motor/Sensory Function Overall Oral Motor/Sensory Function: Within functional limits   Ice Chips Ice chips: Within functional limits Presentation: Spoon   Thin Liquid Thin Liquid: Within functional limits Presentation: Cup;Self Fed    Nectar Thick Nectar Thick Liquid: Not tested   Honey Thick Honey Thick Liquid: Not tested   Puree Puree: Within functional limits Presentation: Spoon   Solid     Solid: Within functional limits  Presentation: Darrol Jump , MA, CCC-SLP 07/18/2018,2:44 PM

## 2018-07-18 NOTE — Discharge Summary (Signed)
[place] [facility] [place] NAME: Kathy Bean    MR#:  [id number]  DATE OF BIRTH:  March 26, 1950  DATE OF ADMISSION:  07/15/2018 ADMITTING PHYSICIAN: Hillary Bow, MD  DATE OF DISCHARGE: 07/18/2018  PRIMARY CARE PHYSICIAN: Rusty Aus, MD    ADMISSION DIAGNOSIS:  Nausea [R11.0] Weakness [R53.1] Hypothyroidism, unspecified type [E03.9]  DISCHARGE DIAGNOSIS:  Active Problems:   Dehydration   External hemorrhoids   Hypertrophy of anal papillae   Loss of weight   Diverticulosis of large intestine without diverticulitis   SECONDARY DIAGNOSIS:   Past Medical History:  Diagnosis Date  . Breast cancer ([place]) 11/2014   left  . Cancer [facility]) 2016   left breast  . Depression   . Dysrhythmia    heart racing  . Family history of adverse reaction to anesthesia    brother had difficulty going under anesthseia  . Headache    hx of migraines   period induced  . Hypertension   . Osteoarthritis   . Parkinson disease [facility])     HOSPITAL COURSE:  68 year old female with a history of Parkinson's disease and hypertension who presented to the emergency room due to generalized weakness and nausea.  1.  Acute blood loss anemia: Patient underwent EGD and colonoscopy.  These procedures were unremarkable and did not show etiology of acute blood loss anemia.  Anemia panel as well as B12 and folate level were within normal limits.  Patient will need to follow-up with oncology for further etiology of anemia. CT of the abdomen with contrast was within normal limits.  2.  Acute kidney injury: This has improved.  3.  Nausea: This may be due to patient's Parkinson's medications which were recently increased.  She will follow-up with her neurologist to discuss this.   4.  Parkinson's disease: Patient will continue on Sinemet.   As mentioned above she will follow-up with neurology regarding side effects related to the increased dose of Sinemet.   5.  History of  breast cancer: Continue Arimidex DISCHARGE CONDITIONS AND DIET:   Stable for discharge regular diet  CONSULTS OBTAINED:  Treatment Team:  Virgel Manifold, MD  DRUG ALLERGIES:  No Known Allergies  DISCHARGE MEDICATIONS:   Allergies as of 07/18/2018   No Known Allergies     Medication List    TAKE these medications   anastrozole 1 MG tablet Commonly known as:  ARIMIDEX TAKE 1 TABLET BY MOUTH EVERY DAY   carbidopa-levodopa 25-100 MG tablet Commonly known as:  SINEMET IR Take 2 tablets by mouth 3 (three) times daily.   carbidopa-levodopa 50-200 MG tablet Commonly known as:  SINEMET CR Take 1 tablet by mouth at bedtime.   citalopram 10 MG tablet Commonly known as:  CELEXA Take 10 mg by mouth at bedtime.   metoCLOPramide 5 MG tablet Commonly known as:  REGLAN Take 5 mg by mouth 3 (three) times daily.   omeprazole 40 MG capsule Commonly known as:  PRILOSEC Take 40 mg by mouth 2 (two) times daily.   potassium chloride 10 MEQ tablet Commonly known as:  K-DUR Take 10 mEq by mouth 2 (two) times daily.   RA Vitamin B-12 TR 1000 MCG Tbcr Generic drug:  Cyanocobalamin Take 1,000 mcg by mouth daily.   triamterene-hydrochlorothiazide 37.5-25 MG tablet Commonly known as:  MAXZIDE-25 Take 2 tablets by mouth daily.         Today   CHIEF COMPLAINT:  Patient without any nausea this morning.  No acute events overnight.   VITAL SIGNS:  Blood pressure 100/69, pulse 82, temperature 98.5 F (36.9 C), temperature source Oral, resp. rate 18, height 5\' 8"  (1.727 m), weight 70.7 kg, SpO2 97 %.   REVIEW OF SYSTEMS:  Review of Systems  Constitutional: Negative.  Negative for chills, fever and malaise/fatigue.  HENT: Negative.  Negative for ear discharge, ear pain, hearing loss, nosebleeds and sore throat.   Eyes: Negative.  Negative for blurred vision and pain.  Respiratory: Negative.  Negative for cough, hemoptysis, shortness of breath and wheezing.    Cardiovascular: Negative.  Negative for chest pain, palpitations and leg swelling.  Gastrointestinal: Negative.  Negative for abdominal pain, blood in stool, diarrhea, nausea and vomiting.  Genitourinary: Negative.  Negative for dysuria.  Musculoskeletal: Negative.  Negative for back pain.  Skin: Negative.   Neurological: Negative for dizziness, tremors, speech change, focal weakness, seizures and headaches.  Endo/Heme/Allergies: Negative.  Does not bruise/bleed easily.  Psychiatric/Behavioral: Negative.  Negative for depression, hallucinations and suicidal ideas.    PHYSICAL EXAMINATION:  GENERAL:  68 y.o.-year-old patient lying in the bed with no acute distress.  NECK:  Supple, no jugular venous distention. No thyroid enlargement, no tenderness.  LUNGS: Normal breath sounds bilaterally, no wheezing, rales,rhonchi  No use of accessory muscles of respiration.  CARDIOVASCULAR: S1, S2 normal. No murmurs, rubs, or gallops.  ABDOMEN: Soft, non-tender, non-distended. Bowel sounds present. No organomegaly or mass.  EXTREMITIES: No pedal edema, cyanosis, or clubbing.  PSYCHIATRIC: The patient is alert and oriented x 3.  SKIN: No obvious rash, lesion, or ulcer.   DATA REVIEW:   CBC Recent Labs  Lab 07/16/18 0504  07/18/18 0447  WBC 6.5  --   --   HGB 9.3*   < > 7.8*  HCT 27.8*  --   --   PLT 256  --   --    < > = values in this interval not displayed.    Chemistries  Recent Labs  Lab 07/15/18 1502  07/18/18 0447  NA 136   < > 139  K 2.6*   < > 3.2*  CL 100   < > 109  CO2 23   < > 22  GLUCOSE 99   < > 72  BUN 15   < > 11  CREATININE 0.95   < > 0.65  CALCIUM 9.7   < > 8.4*  MG 1.8  --   --   AST 13*  --   --   ALT <5  --   --   ALKPHOS 76  --   --   BILITOT 1.2  --   --    < > = values in this interval not displayed.    Cardiac Enzymes Recent Labs  Lab 07/15/18 1502  TROPONINI <0.03    Microbiology Results  @MICRORSLT48 @  RADIOLOGY:  No results  found.    Allergies as of 07/18/2018   No Known Allergies     Medication List    TAKE these medications   anastrozole 1 MG tablet Commonly known as:  ARIMIDEX TAKE 1 TABLET BY MOUTH EVERY DAY   carbidopa-levodopa 25-100 MG tablet Commonly known as:  SINEMET IR Take 2 tablets by mouth 3 (three) times daily.   carbidopa-levodopa 50-200 MG tablet Commonly known as:  SINEMET CR Take 1 tablet by mouth at bedtime.   citalopram 10 MG tablet Commonly known as:  CELEXA Take 10 mg by mouth at bedtime.   metoCLOPramide 5  MG tablet Commonly known as:  REGLAN Take 5 mg by mouth 3 (three) times daily.   omeprazole 40 MG capsule Commonly known as:  PRILOSEC Take 40 mg by mouth 2 (two) times daily.   potassium chloride 10 MEQ tablet Commonly known as:  K-DUR Take 10 mEq by mouth 2 (two) times daily.   RA Vitamin B-12 TR 1000 MCG Tbcr Generic drug:  Cyanocobalamin Take 1,000 mcg by mouth daily.   triamterene-hydrochlorothiazide 37.5-25 MG tablet Commonly known as:  MAXZIDE-25 Take 2 tablets by mouth daily.          Management plans discussed with the patient and she is in agreement. Stable for discharge   Patient should follow up with pcp  CODE STATUS:     Code Status Orders  (From admission, onward)         Start     Ordered   07/15/18 2132  Do not attempt resuscitation (DNR)  Continuous    Question Answer Comment  In the event of cardiac or respiratory ARREST Do not call a "code blue"   In the event of cardiac or respiratory ARREST Do not perform Intubation, CPR, defibrillation or ACLS   In the event of cardiac or respiratory ARREST Use medication by any route, position, wound care, and other measures to relive pain and suffering. May use oxygen, suction and manual treatment of airway obstruction as needed for comfort.      07/15/18 2131        Code Status History    Date Active Date Inactive Code Status Order ID Comments User Context   07/15/2018 2100  07/15/2018 2131 Full Code [id number]  Mayer Camel, NP ED    Advance Directive Documentation     Most Recent Value  Type of Advance Directive  Healthcare Power of Attorney, Living will  Pre-existing out of facility DNR order (yellow form or pink MOST form)  -  "MOST" Form in Place?  -      TOTAL TIME TAKING CARE OF THIS PATIENT: 38 minutes.    Note: This dictation was prepared with Dragon dictation along with smaller phrase technology. Any transcriptional errors that result from this process are unintentional.  Bettey Costa M.D on 07/18/2018 at 12:59 PM  Between 7am to 6pm - Pager - [phone] After 6pm go to [email or web address] - password [email or web address] [facility]  Office  [phone]  CC: Primary care physician; Rusty Aus, MD

## 2018-07-18 NOTE — Progress Notes (Signed)
Kathy Bean , MD [address], [place], [place], [place], [address] [address], [place], [place], [place], [address] Phone: [phone]  Fax: [phone]   Kathy Bean is being followed for weight loss   Subjective: Only complaint is weight loss, no overt blood loss.    Objective: Vital signs in last 24 hours: Vitals:   07/17/18 1312 07/17/18 1357 07/17/18 2109 07/18/18 0505  BP: 103/64 114/80 115/77 100/69  Pulse: 64 77 78 82  Resp: 15 17 18 18   Temp:  98.4 F (36.9 C) 98.3 F (36.8 C) 98.5 F (36.9 C)  TempSrc:  Oral Oral Oral  SpO2: 100% 97% 98% 97%  Weight:      Height:       Weight change:   Intake/Output Summary (Last 24 hours) at 07/18/2018 1009 Last data filed at 07/17/2018 2110 Gross per 24 hour  Intake 300 ml  Output 25 ml  Net 275 ml     Exam: Heart:: Regular rate and rhythm, S1S2 present or without murmur or extra heart sounds Lungs: normal, clear to auscultation and clear to auscultation and percussion Abdomen: soft, nontender, normal bowel sounds   Lab Results: @LABTEST2 @ Micro Results: Recent Results (from the past 240 hour(s))  SARS Coronavirus 2 (CEPHEID - Performed in [place] hospital lab), Hosp Order     Status: None   Collection Time: 07/15/18  6:37 PM  Result Value Ref Range Status   SARS Coronavirus 2 NEGATIVE NEGATIVE Final    Comment: (NOTE) If result is NEGATIVE SARS-CoV-2 target nucleic acids are NOT DETECTED. The SARS-CoV-2 RNA is generally detectable in upper and lower  respiratory specimens during the acute phase of infection. The lowest  concentration of SARS-CoV-2 viral copies this assay can detect is 250  copies / mL. A negative result does not preclude SARS-CoV-2 infection  and should not be used as the sole basis for treatment or other  patient management decisions.  A negative result may occur with  improper specimen collection / handling, submission of specimen other  than nasopharyngeal swab, presence  of viral mutation(s) within the  areas targeted by this assay, and inadequate number of viral copies  (<250 copies / mL). A negative result must be combined with clinical  observations, patient history, and epidemiological information. If result is POSITIVE SARS-CoV-2 target nucleic acids are DETECTED. The SARS-CoV-2 RNA is generally detectable in upper and lower  respiratory specimens dur ing the acute phase of infection.  Positive  results are indicative of active infection with SARS-CoV-2.  Clinical  correlation with patient history and other diagnostic information is  necessary to determine patient infection status.  Positive results do  not rule out bacterial infection or co-infection with other viruses. If result is PRESUMPTIVE POSTIVE SARS-CoV-2 nucleic acids MAY BE PRESENT.   A presumptive positive result was obtained on the submitted specimen  and confirmed on repeat testing.  While 2019 novel coronavirus  (SARS-CoV-2) nucleic acids may be present in the submitted sample  additional confirmatory testing may be necessary for epidemiological  and / or clinical management purposes  to differentiate between  SARS-CoV-2 and other Sarbecovirus currently known to infect humans.  If clinically indicated additional testing with an alternate test  methodology [phone]) is advised. The SARS-CoV-2 RNA is generally  detectable in upper and lower respiratory sp ecimens during the acute  phase of infection. The expected result is Negative. Fact Sheet for Patients:  StrictlyIdeas.no Fact Sheet for Healthcare Providers: BankingDealers.co.za This test  is not yet approved or cleared by the [place] and has been authorized for detection and/or diagnosis of SARS-CoV-2 by FDA under an Emergency Use Authorization (EUA).  This EUA will remain in effect (meaning this test can be used) for the duration of the COVID-19 declaration under Section  564(b)(1) of the Act, 21 U.S.C. section 360bbb-3(b)(1), unless the authorization is terminated or revoked sooner. Performed at [facility], [address] [place], [place] [address]    Studies/Results: No results found. Medications: I have reviewed the patient's current medications. Scheduled Meds: . carbidopa-levodopa  1 tablet Oral QHS  . carbidopa-levodopa  2 tablet Oral TID  . citalopram  10 mg Oral QHS  . feeding supplement (ENSURE ENLIVE)  237 mL Oral BID BM  . folic acid  1 mg Oral Daily  . metoCLOPramide  5 mg Oral TID  . multivitamin with minerals  1 tablet Oral Daily  . pantoprazole (PROTONIX) IV  40 mg Intravenous Q12H  . sodium chloride flush  3 mL Intravenous Q12H  . thiamine  100 mg Oral Daily  . vitamin B-12  1,000 mcg Oral Daily   Continuous Infusions: PRN Meds:.acetaminophen **OR** acetaminophen, ondansetron **OR** ondansetron (ZOFRAN) IV, polyethylene glycol, traZODone   Assessment: Active Problems:   Dehydration   External hemorrhoids   Hypertrophy of anal papillae   Loss of weight   Diverticulosis of large intestine without diverticulitis  Kathy Bean 68 y.o. female admitted with weight loss and decreased appetite. EGD+colonoscopy done by DR Bonna Gains were unrevealing. Concern for side effects of parkinson's meds. Hb down a bit but likely secondary to rehydration (cr from 1.37----> 0.71) No overt bleeding.   Plan: 1. With no overt bleeding- no iron deficiency or B12 deficiency - normocytic anemia - suggest further hematology evaluation. Normal CT abdomen with Contrast.   2. Monitor CBC expect no further drop since her creatinine has normalized from being previously elevated.   3. If concerns for weight loss persists suggest CT chest   Discussed plan with Dr Benjie Karvonen.    LOS: 3 days   Kathy Bellows, MD 07/18/2018, 10:09 AM

## 2018-07-18 NOTE — Progress Notes (Signed)
Physical Therapy Treatment Patient Details Name: Kathy Bean MRN: [id number] DOB: 1951-02-01 Today's Date: 07/18/2018    History of Present Illness Kathy Bean is a 62yoF who comes [facility] on 6[facility]5 after 6-8 weeks debility, ABD pain, decreased PO intake and anorexia, 15lb weight loss, pt noted to have hyopkalemia. Pt suspected to have ABLA, GIB. Pt scheduled for endoscopy on 6/7.  PMH: Parkinson's (x5Y), HTN., migraine, depression, LtBrCA.     PT Comments    Pt is making gradual progress towards goals with slight increased ambulation distance noted this session. B LE weakness noted, R LE slightly weaker. Cues for safety with RW use and for gait training. Fatigues with mobility. Good motivation to participate. Will continue to progress.   Follow Up Recommendations  Home health PT;Supervision for mobility/OOB     Equipment Recommendations  None recommended by PT    Recommendations for Other Services       Precautions / Restrictions Precautions Precautions: Fall Precaution Comments: Recent history of very limiting sciatica.  Restrictions Weight Bearing Restrictions: No    Mobility  Bed Mobility Overal bed mobility: Needs Assistance Bed Mobility: Supine to Sit     Supine to sit: Min assist     General bed mobility comments: needs assist with trunk control. Once seated at EOB, able to sit with upright posture  Transfers Overall transfer level: Needs assistance Equipment used: Rolling walker (2 wheeled) Transfers: Sit to/from Stand Sit to Stand: Min assist         General transfer comment: cues for pushing from seated surface. Once standing, B knees flexed, however able to self correct with cues.  Ambulation/Gait Ambulation/Gait assistance: Min guard Gait Distance (Feet): 100 Feet Assistive device: Rolling walker (2 wheeled) Gait Pattern/deviations: Step-through pattern;Decreased step length - left     General Gait Details: tends to let RW get too far in  front, cues for safety. Tactile cues for B heel strike. RW adjusted to appropriate height. Fatigues with increased distance   Stairs             Wheelchair Mobility    Modified Rankin (Stroke Patients Only)       Balance Overall balance assessment: Needs assistance Sitting-balance support: Feet supported Sitting balance-Leahy Scale: Good     Standing balance support: Bilateral upper extremity supported Standing balance-Leahy Scale: Fair                              Cognition Arousal/Alertness: Awake/alert Behavior During Therapy: WFL for tasks assessed/performed Overall Cognitive Status: Within Functional Limits for tasks assessed                                        Exercises Other Exercises Other Exercises: Seated ther-ex performed including alt B marching, LAQ, and sit<>stands. SUpine ther-ex performed B LE heel slides and hip add squeezes. Cues for correct technqiue x 10-12 reps. cga    General Comments        Pertinent Vitals/Pain Pain Assessment: No/denies pain    Home Living                      Prior Function            PT Goals (current goals can now be found in the care plan section) Acute Rehab PT Goals Patient Stated Goal:  regain strength and AMB function PT Goal Formulation: With patient Time For Goal Achievement: 07/30/18 Potential to Achieve Goals: Good Progress towards PT goals: Progressing toward goals    Frequency    Min 2X/week      PT Plan Current plan remains appropriate    Co-evaluation              AM-PAC PT "6 Clicks" Mobility   Outcome Measure  Help needed turning from your back to your side while in a flat bed without using bedrails?: None Help needed moving from lying on your back to sitting on the side of a flat bed without using bedrails?: A Little Help needed moving to and from a bed to a chair (including a wheelchair)?: A Little Help needed standing up from a chair  using your arms (e.g., wheelchair or bedside chair)?: A Little Help needed to walk in hospital room?: A Little Help needed climbing 3-5 steps with a railing? : A Lot 6 Click Score: 18    End of Session Equipment Utilized During Treatment: Gait belt Activity Tolerance: Patient tolerated treatment well Patient left: in bed;with bed alarm set Nurse Communication: Mobility status PT Visit Diagnosis: Unsteadiness on feet (R26.81);Other abnormalities of gait and mobility (R26.89);Muscle weakness (generalized) (M62.81);Difficulty in walking, not elsewhere classified (R26.2)     Time: 1610-9604 PT Time Calculation (min) (ACUTE ONLY): 23 min  Charges:  $Gait Training: 8-22 mins $Therapeutic Exercise: 8-22 mins                     Greggory Stallion, PT, DPT [phone]    [name] 07/18/2018, 3:22 PM

## 2018-07-19 ENCOUNTER — Telehealth: Payer: Self-pay | Admitting: Gastroenterology

## 2018-07-19 DIAGNOSIS — C50919 Malignant neoplasm of unspecified site of unspecified female breast: Secondary | ICD-10-CM

## 2018-07-19 DIAGNOSIS — I10 Essential (primary) hypertension: Secondary | ICD-10-CM | POA: Diagnosis not present

## 2018-07-19 DIAGNOSIS — D649 Anemia, unspecified: Secondary | ICD-10-CM | POA: Diagnosis not present

## 2018-07-19 DIAGNOSIS — G20 Parkinson's disease: Secondary | ICD-10-CM

## 2018-07-19 DIAGNOSIS — E441 Mild protein-calorie malnutrition: Secondary | ICD-10-CM | POA: Diagnosis not present

## 2018-07-19 DIAGNOSIS — F39 Unspecified mood [affective] disorder: Secondary | ICD-10-CM

## 2018-07-19 DIAGNOSIS — R110 Nausea: Secondary | ICD-10-CM

## 2018-07-19 LAB — SURGICAL PATHOLOGY

## 2018-07-19 NOTE — Telephone Encounter (Signed)
-----   Message from Virgel Manifold, MD sent at 07/19/2018  1:00 PM EDT ----- I see this pt has an appt with me in Sep. But she is a [facility] pt of Dr. Alice Reichert. We can switch her care to [place] if she would like. But give her the option of following up with [place] or them so she only keeps appointments with one place

## 2018-07-19 NOTE — Telephone Encounter (Signed)
Left vm for pt to call office and either keep apt with Dr. Bonna Gains for 10/19/18  Or follow up with [facility]

## 2018-07-20 ENCOUNTER — Ambulatory Visit: Admission: RE | Admit: 2018-07-20 | Payer: Medicare Other | Source: Home / Self Care | Admitting: Internal Medicine

## 2018-07-20 ENCOUNTER — Encounter: Admission: RE | Payer: Self-pay | Source: Home / Self Care

## 2018-07-20 LAB — H PYLORI, IGM, IGG, IGA AB
H Pylori IgG: 0.28 Index Value (ref 0.00–0.79)
H. Pylogi, Iga Abs: <9 units (ref 0.0–8.9)
H. Pylogi, Igm Abs: <9 units (ref 0.0–8.9)

## 2018-07-20 SURGERY — COLONOSCOPY WITH PROPOFOL
Anesthesia: General

## 2018-07-21 ENCOUNTER — Telehealth: Payer: Self-pay | Admitting: Gastroenterology

## 2018-07-21 NOTE — Telephone Encounter (Signed)
Spoke with Kathy Bean he states pt would prefer to Stay with Dr. Bonna Gains and apt was kept for 10/19/2018

## 2018-07-21 NOTE — Telephone Encounter (Signed)
-----   Message from Virgel Manifold, MD sent at 07/19/2018  1:00 PM EDT ----- I see this pt has an appt with me in Sep. But she is a [facility] pt of Dr. Alice Reichert. We can switch her care to [place] if she would like. But give her the option of following up with [place] or them so she only keeps appointments with one place

## 2018-07-26 DIAGNOSIS — G479 Sleep disorder, unspecified: Secondary | ICD-10-CM | POA: Diagnosis not present

## 2018-07-27 ENCOUNTER — Ambulatory Visit: Admit: 2018-07-27 | Payer: Medicare Other | Admitting: Internal Medicine

## 2018-07-27 SURGERY — COLONOSCOPY WITH PROPOFOL
Anesthesia: General

## 2018-08-02 LAB — HIV ANTIBODY (ROUTINE TESTING W REFLEX): HIV Screen 4th Generation wRfx: NONREACTIVE

## 2018-08-07 NOTE — Progress Notes (Signed)
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  INTERVAL HISTORY: Patient returns to clinic today for routine 16-monthevaluation and hospitalization.  She has recently moved to the hospital with significant weakness and fatigue and found to have a hemoglobin of less than 8.0.  Colonoscopy and EGD did not reveal definitive source of bleeding.  Her weakness and fatigue have improved, but she is not back to her baseline.  She otherwise feels well.  She continues to tolerate letrozole without significant side effects.  She has no neurologic complaints. She denies any recent fevers or illnesses. She denies any pain. She has a good appetite and denies weight loss.  She denies any chest pain, shortness of breath, cough, or hemoptysis.  She has no nausea, vomiting, constipation, or diarrhea.  She denies any melena or hematochezia.  She has no urinary complaints.  Patient offers no further specific complaints today.  REVIEW OF SYSTEMS:   Review of Systems  Constitutional: Positive for malaise/fatigue. Negative for fever and weight loss.  Respiratory: Negative.  Negative for cough and shortness of breath.   Cardiovascular: Negative.  Negative for chest pain and leg swelling.  Gastrointestinal: Negative.  Negative for abdominal pain, blood in stool, constipation and melena.  Genitourinary: Negative.  Negative for dysuria and hematuria.  Musculoskeletal: Negative.  Negative for back pain.  Skin: Negative.  Negative for rash.  Neurological: Positive for weakness. Negative for sensory change, focal weakness and headaches.  Psychiatric/Behavioral: Negative.  The patient is not nervous/anxious.    As per  HPI. Otherwise, a complete review of systems is negative.   PAST MEDICAL HISTORY: Past Medical History:  Diagnosis Date  . [facility] [place]   [facility]  . [facility]   left breast  . Depression   . Dysrhythmia    heart racing  . Family history of adverse reaction to anesthesia    brother had difficulty going under anesthseia  . Headache    hx of migraines   period induced  . Hypertension   . Osteoarthritis   . Parkinson disease ([facility]     PAST SURGICAL HISTORY: Past Surgical History:  Procedure Laterality Date  . BREAST BIOPSY Left 10/2014   positive  . BREAST EXCISIONAL BIOPSY Left 11/29/2014   invasive mam ca +  . COLONOSCOPY Left 07/17/2018   Procedure: COLONOSCOPY;  Surgeon: TVirgel Manifold MD;  [facility];  Service: Endoscopy;  Laterality: Left;  . DILATION AND CURETTAGE OF UTERUS    . ESOPHAGOGASTRODUODENOSCOPY Left 07/17/2018   Procedure: ESOPHAGOGASTRODUODENOSCOPY (EGD);  Surgeon: TVirgel Manifold MD;  [facility];  Service: Endoscopy;  Laterality: Left;  . Laminectomy Posterior Cervicle Decomp W/Facetectomy & Foraminotomy     [id number] 3....Marland KitchenMarland KitchenXCISION CENTRAL LEFT DLarimerHERNIATION L 2-3; Surgeon: TLoni Dolly MD; [facility]; Laterality: Left  . Laminectomy Posterior Lumbar Facetectomy & Foraminotomy W/Decomp     BILATERAL LUMBAR DECOMPRESSION L2-3, L3-4, L4-5; Surgeon: TLoni Dolly MD; [facility] Service: [facility]opedics; Laterality: Bilateral;  . PARTIAL MASTECTOMY WITH NEEDLE LOCALIZATION Left 11/29/2014   Procedure: PARTIAL MASTECTOMY WITH NEEDLE LOCALIZATION;  Surgeon: JLeonie Green MD;  Location: [facility] ORS;  Service: General;  Laterality: Left;  .  SENTINEL NODE BIOPSY Left 11/29/2014   Procedure: SENTINEL NODE BIOPSY;  Surgeon: Leonie Green, MD;  Location: [facility] ORS;  Service: General;  Laterality: Left;    FAMILY HISTORY Family History  Problem Relation Age  of Onset  . Breast cancer Maternal Aunt   . Breast cancer Paternal Aunt   . Coronary artery disease Mother   . Heart attack Mother   . Coronary artery disease Paternal Grandmother        ADVANCED DIRECTIVES:    HEALTH MAINTENANCE: Social History   Tobacco Use  . Smoking status: Former Smoker    Quit date: 11/22/1999    Years since quitting: 18.7  . Smokeless tobacco: Never Used  Substance Use Topics  . Alcohol use: Yes    Alcohol/week: 14.0 standard drinks    Types: 14 Glasses of wine per week  . Drug use: No     No Known Allergies  Current Outpatient Medications  Medication Sig Dispense Refill  . anastrozole (ARIMIDEX) 1 MG tablet TAKE 1 TABLET BY MOUTH EVERY DAY (Patient taking differently: Take 1 mg by mouth daily. ) 90 tablet 3  . calcium carbonate (TUMS EX) 750 MG chewable tablet Chew 1 tablet by mouth 3 (three) times daily.    . carbidopa-levodopa (SINEMET CR) 50-200 MG tablet Take 1 tablet by mouth at bedtime.    . carbidopa-levodopa (SINEMET IR) 25-100 MG tablet Take 2 tablets by mouth 3 (three) times daily.     . citalopram (CELEXA) 20 MG tablet Take 1 tablet by mouth 1 day or 1 dose.    . Cyanocobalamin (RA VITAMIN B-12 TR) 1000 MCG TBCR Take 1,000 mcg by mouth daily.     Marland Kitchen omeprazole (PRILOSEC) 40 MG capsule Take 40 mg by mouth 2 (two) times daily.    . potassium chloride (K-DUR) 10 MEQ tablet Take 10 mEq by mouth 2 (two) times daily.     Marland Kitchen triamterene-hydrochlorothiazide (MAXZIDE-25) 37.5-25 MG tablet Take 2 tablets by mouth daily.      No current facility-administered medications for this visit.     OBJECTIVE: Vitals:   08/09/18 1059  BP: (!) 135/93  Pulse: 95  Temp: (!) 97.4 F (36.3 C)     Body mass index is 25.06 kg/m.    ECOG FS:1 - Symptomatic but completely ambulatory  General: Well-developed, well-nourished, no acute distress.  Sitting in a wheelchair.   Eyes: Pink conjunctiva, anicteric sclera. HEENT: Normocephalic, moist mucous membranes.  Lungs: Clear to auscultation bilaterally. Heart: Regular rate and rhythm. No rubs, murmurs, or gallops. Abdomen: Soft, nontender, nondistended. No organomegaly noted, normoactive bowel sounds. Musculoskeletal: No edema, cyanosis, or clubbing. Neuro: Alert, answering all questions appropriately. Cranial nerves grossly intact. Skin: No rashes or petechiae noted. Psych: Normal affect.  LAB RESULTS:  Lab Results  Component Value Date   NA 141 08/09/2018   K 3.3 (L) 08/09/2018   CL 103 08/09/2018   CO2 27 08/09/2018   GLUCOSE 95 08/09/2018   BUN 12 08/09/2018   CREATININE 0.64 08/09/2018   CALCIUM 10.0 08/09/2018   PROT 7.3 08/09/2018   ALBUMIN 4.4 08/09/2018   AST 20 08/09/2018   ALT 6 08/09/2018   ALKPHOS 60 08/09/2018   BILITOT 0.8 08/09/2018   GFRNONAA >60 08/09/2018   GFRAA >60 08/09/2018    Lab Results  Component Value Date   WBC 6.6 08/09/2018   NEUTROABS 4.1 08/09/2018   HGB 11.9 (L) 08/09/2018   HCT 37.7 08/09/2018   MCV 101.1 (H) 08/09/2018  PLT 252 08/09/2018     STUDIES: Dg Chest Portable 1 View  Result Date: 07/15/2018 CLINICAL DATA:  Progressive weakness over the past 2 months. EXAM: PORTABLE CHEST 1 VIEW COMPARISON:  None. FINDINGS: The cardiac silhouette, mediastinal and hilar contours are within normal limits. There is mild tortuosity and calcification of the thoracic aorta. The lungs are clear. No pleural effusions. No worrisome pulmonary lesions. The bony thorax is intact. IMPRESSION: No acute cardiopulmonary findings. Electronically Signed   By: Marijo Sanes M.D.   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  PLAN:    1. 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 Oncotype was ordered, but was denied by insurance. Given the  size of patient's tumor, she is likely low risk and she did not receive adjuvant chemotherapy.  Patient completed adjuvant XRT.  Continue letrozole for total of 5 years completing treatment in January 2022. Her most recent mammogram on November 01, 2017 was reported as BI-RADS 2.  Repeat in September 2020.  Return to clinic in 6 months for routine evaluation.  2. Osteopenia: Patient's most recent bone mineral density on March 24, 2018 reported a T score of -1.5 which is essentially unchanged from 1 year prior when it was -1.4.  No intervention needed.  Continue calcium and vitamin D supplementation.  Repeat in February 2021.   3.  Anemia: Unclear etiology.  Patient's hemoglobin is now improved and nearly back to normal at 11.9.  Iron stores, folate, B12 are all within normal limits.  Probably specific antibody test is negative.  She only has a mildly elevated LDH and haptoglobin is pending at time of dictation.  No intervention is needed at this time.  I spent a total of 30 minutes face-to-face with the patient of which greater than 50% of the visit was spent in counseling and coordination of care as detailed above.   Patient expressed understanding and was in agreement with this plan. She also understands that She can call clinic at any time with any questions, concerns, or complaints.   Breast cancer, left [facility])   Staging form: Breast, AJCC 7th Edition     Pathologic stage from 12/04/2014: Stage IA (T1b, N0, cM0) - Signed by Lloyd Huger, MD on 12/04/2014   Lloyd Huger, MD   08/10/2018 7:10 AM

## 2018-08-08 ENCOUNTER — Other Ambulatory Visit: Payer: Self-pay

## 2018-08-08 ENCOUNTER — Telehealth: Payer: Self-pay | Admitting: Oncology

## 2018-08-08 NOTE — Telephone Encounter (Signed)
Spoke with pt to confirm appt date/time, do pre-appt screen which was completed, and adv of Covid-19 guidelines for appt regarding screening questions, temperature check, face mask required, and no visitors allowed °

## 2018-08-09 ENCOUNTER — Other Ambulatory Visit: Payer: Self-pay

## 2018-08-09 ENCOUNTER — Encounter: Payer: Self-pay | Admitting: Oncology

## 2018-08-09 ENCOUNTER — Inpatient Hospital Stay: Payer: Medicare Other | Attending: Oncology | Admitting: Oncology

## 2018-08-09 ENCOUNTER — Inpatient Hospital Stay: Payer: Medicare Other

## 2018-08-09 VITALS — BP 135/93 | HR 95 | Temp 97.4°F | Ht 67.0 in | Wt 160.0 lb

## 2018-08-09 DIAGNOSIS — Z79811 Long term (current) use of aromatase inhibitors: Secondary | ICD-10-CM | POA: Insufficient documentation

## 2018-08-09 DIAGNOSIS — Z79899 Other long term (current) drug therapy: Secondary | ICD-10-CM | POA: Diagnosis not present

## 2018-08-09 DIAGNOSIS — I10 Essential (primary) hypertension: Secondary | ICD-10-CM | POA: Diagnosis not present

## 2018-08-09 DIAGNOSIS — M48061 Spinal stenosis, lumbar region without neurogenic claudication: Secondary | ICD-10-CM | POA: Insufficient documentation

## 2018-08-09 DIAGNOSIS — Z87891 Personal history of nicotine dependence: Secondary | ICD-10-CM | POA: Diagnosis not present

## 2018-08-09 DIAGNOSIS — M8580 Other specified disorders of bone density and structure, unspecified site: Secondary | ICD-10-CM | POA: Insufficient documentation

## 2018-08-09 DIAGNOSIS — G20 Parkinson's disease: Secondary | ICD-10-CM | POA: Diagnosis not present

## 2018-08-09 DIAGNOSIS — F329 Major depressive disorder, single episode, unspecified: Secondary | ICD-10-CM | POA: Insufficient documentation

## 2018-08-09 DIAGNOSIS — C50912 Malignant neoplasm of unspecified site of left female breast: Secondary | ICD-10-CM | POA: Insufficient documentation

## 2018-08-09 DIAGNOSIS — Z803 Family history of malignant neoplasm of breast: Secondary | ICD-10-CM | POA: Diagnosis not present

## 2018-08-09 DIAGNOSIS — Z170 Estrogen receptor positive status [ER+]: Secondary | ICD-10-CM | POA: Insufficient documentation

## 2018-08-09 DIAGNOSIS — D649 Anemia, unspecified: Secondary | ICD-10-CM | POA: Insufficient documentation

## 2018-08-09 DIAGNOSIS — C50312 Malignant neoplasm of lower-inner quadrant of left female breast: Secondary | ICD-10-CM

## 2018-08-09 DIAGNOSIS — G620 Drug-induced polyneuropathy: Secondary | ICD-10-CM

## 2018-08-09 LAB — COMPREHENSIVE METABOLIC PANEL
ALT: 6 U/L (ref 0–44)
AST: 20 U/L (ref 15–41)
Albumin: 4.4 g/dL (ref 3.5–5.0)
Alkaline Phosphatase: 60 U/L (ref 38–126)
Anion gap: 11 (ref 5–15)
BUN: 12 mg/dL (ref 8–23)
CO2: 27 mmol/L (ref 22–32)
Calcium: 10 mg/dL (ref 8.9–10.3)
Chloride: 103 mmol/L (ref 98–111)
Creatinine, Ser: 0.64 mg/dL (ref 0.44–1.00)
GFR calc Af Amer: >60 mL/min (ref >60)
GFR calc non Af Amer: >60 mL/min (ref >60)
Glucose, Bld: 95 mg/dL (ref 70–99)
Potassium: 3.3 mmol/L — ABNORMAL LOW (ref 3.5–5.1)
Sodium: 141 mmol/L (ref 135–145)
Total Bilirubin: 0.8 mg/dL (ref 0.3–1.2)
Total Protein: 7.3 g/dL (ref 6.5–8.1)

## 2018-08-09 LAB — CBC WITH DIFFERENTIAL/PLATELET
Abs Immature Granulocytes: 0.04 10*3/uL (ref 0.00–0.07)
Basophils Absolute: 0 10*3/uL (ref 0.0–0.1)
Basophils Relative: 1 %
Eosinophils Absolute: 0 10*3/uL (ref 0.0–0.5)
Eosinophils Relative: 1 %
HCT: 37.7 % (ref 36.0–46.0)
Hemoglobin: 11.9 g/dL — ABNORMAL LOW (ref 12.0–15.0)
Immature Granulocytes: 1 %
Lymphocytes Relative: 25 %
Lymphs Abs: 1.7 10*3/uL (ref 0.7–4.0)
MCH: 31.9 pg (ref 26.0–34.0)
MCHC: 31.6 g/dL (ref 30.0–36.0)
MCV: 101.1 fL — ABNORMAL HIGH (ref 80.0–100.0)
Monocytes Absolute: 0.7 10*3/uL (ref 0.1–1.0)
Monocytes Relative: 11 %
Neutro Abs: 4.1 10*3/uL (ref 1.7–7.7)
Neutrophils Relative %: 61 %
Platelets: 252 10*3/uL (ref 150–400)
RBC: 3.73 MIL/uL — ABNORMAL LOW (ref 3.87–5.11)
RDW: 16.3 % — ABNORMAL HIGH (ref 11.5–15.5)
WBC: 6.6 10*3/uL (ref 4.0–10.5)
nRBC: 0 % (ref 0.0–0.2)

## 2018-08-09 LAB — FERRITIN: Ferritin: 585 ng/mL — ABNORMAL HIGH (ref 11–307)

## 2018-08-09 LAB — RETIC PANEL
Immature Retic Fract: 19.4 % — ABNORMAL HIGH (ref 2.3–15.9)
RBC.: 3.73 MIL/uL — ABNORMAL LOW (ref 3.87–5.11)
Retic Count, Absolute: 167.9 10*3/uL (ref 19.0–186.0)
Retic Ct Pct: 4.5 % — ABNORMAL HIGH (ref 0.4–3.1)
Reticulocyte Hemoglobin: 35.4 pg (ref >27.9)

## 2018-08-09 LAB — IRON AND TIBC
Iron: 60 ug/dL (ref 28–170)
Saturation Ratios: 18 % (ref 10.4–31.8)
TIBC: 343 ug/dL (ref 250–450)
UIBC: 283 ug/dL

## 2018-08-09 LAB — LACTATE DEHYDROGENASE: LDH: 202 U/L — ABNORMAL HIGH (ref 98–192)

## 2018-08-09 LAB — DAT, POLYSPECIFIC AHG (ARMC ONLY): Polyspecific AHG test: NEGATIVE

## 2018-08-09 NOTE — Progress Notes (Signed)
Patient stated that she had been doing well with no complaints. Patient denied skin discoloration, nipple discharge or pain. Patient's last bone density was on 03/24/2018 and her mammogram is on 11/03/2018.

## 2018-08-10 ENCOUNTER — Encounter: Payer: Self-pay | Admitting: Oncology

## 2018-08-10 LAB — THYROID PANEL WITH TSH
Free Thyroxine Index: 1.7 (ref 1.2–4.9)
T3 Uptake Ratio: 21 % — ABNORMAL LOW (ref 24–39)
T4, Total: 8.3 ug/dL (ref 4.5–12.0)
TSH: 2.92 u[IU]/mL (ref 0.450–4.500)

## 2018-08-10 LAB — HAPTOGLOBIN: Haptoglobin: 168 mg/dL (ref 37–355)

## 2018-09-28 ENCOUNTER — Other Ambulatory Visit: Payer: Self-pay | Admitting: *Deleted

## 2018-09-28 NOTE — Telephone Encounter (Signed)
Fax received from pharmacy to refill Lexapro, we have never given her this and she is on Celexa from her PCP. I spoke with patient who told me to disregard this it was a mistake

## 2018-10-19 ENCOUNTER — Other Ambulatory Visit: Payer: Self-pay

## 2018-10-19 ENCOUNTER — Ambulatory Visit (INDEPENDENT_AMBULATORY_CARE_PROVIDER_SITE_OTHER): Payer: Medicare Other | Admitting: Gastroenterology

## 2018-10-19 ENCOUNTER — Encounter: Payer: Self-pay | Admitting: Gastroenterology

## 2018-10-19 VITALS — BP 127/83 | HR 92 | Temp 97.6°F | Wt 161.1 lb

## 2018-10-19 DIAGNOSIS — R1013 Epigastric pain: Secondary | ICD-10-CM | POA: Diagnosis not present

## 2018-10-19 DIAGNOSIS — K219 Gastro-esophageal reflux disease without esophagitis: Secondary | ICD-10-CM | POA: Diagnosis not present

## 2018-10-19 MED ORDER — FAMOTIDINE 20 MG PO TABS: 20.0000 mg | ORAL_TABLET | Freq: Every day | ORAL | 0 refills | Status: DC

## 2018-10-19 NOTE — Progress Notes (Signed)
Kathy Antigua, MD [address]  [place]  [place], [place] [address]  Main: [phone]  Fax: [phone]   Primary Care Physician: Rusty Aus, MD   Chief Complaint  Patient presents with  . Hospitalization Follow-up    Patient states that she is still having some gerd symptoms and some abdominal pain. Patient had rectal bleeding Saturday through Tuesday,. Has had some constipation     HPI: Kathy Bean is a 68 y.o. female presents for follow-up of abdominal pain.  reports intermittent midepigastric abdominal pain, and regurgitation.  Is now on PPI twice daily, full dose omeprazole by her primary care provider due to significant symptoms on once daily medication-reports doing much better.  States is eating well and has gained her weight back with this.  No nausea or vomiting.  However, continues to have symptoms at that time.  Twice daily medication was started in June 2020 and has made a significant difference for her.  No dysphagia.  Patient underwent EGD and colonoscopy in June 2020 during her hospitalization for abdominal pain  EGD showed gastric fundus polyps and biopsies consistent with fundic gland polyps.  Otherwise normal exam. Colonoscopy showed external hemorrhoids, diverticulosis, otherwise normal.   Current Outpatient Medications  Medication Sig Dispense Refill  . anastrozole (ARIMIDEX) 1 MG tablet TAKE 1 TABLET BY MOUTH EVERY DAY (Patient taking differently: Take 1 mg by mouth daily. ) 90 tablet 3  . calcium carbonate (TUMS EX) 750 MG chewable tablet Chew 1 tablet by mouth 3 (three) times daily.    . carbidopa-levodopa (SINEMET CR) 50-200 MG tablet Take 1 tablet by mouth at bedtime.    . carbidopa-levodopa (SINEMET IR) 25-100 MG tablet Take 2 tablets by mouth 3 (three) times daily.     . citalopram (CELEXA) 20 MG tablet Take 1 tablet by mouth 1 day or 1 dose.    . Cyanocobalamin (RA VITAMIN B-12 TR) 1000 MCG TBCR Take 1,000 mcg by mouth daily.      Marland Kitchen omeprazole (PRILOSEC) 40 MG capsule Take 40 mg by mouth 2 (two) times daily.    . potassium chloride (K-DUR) 10 MEQ tablet Take 10 mEq by mouth 2 (two) times daily.     Marland Kitchen triamterene-hydrochlorothiazide (MAXZIDE-25) 37.5-25 MG tablet Take 2 tablets by mouth daily.     . famotidine (PEPCID) 20 MG tablet Take 1 tablet (20 mg total) by mouth at bedtime. 90 tablet 0   No current facility-administered medications for this visit.     Allergies as of 10/19/2018  . (No Known Allergies)    ROS:  General: Negative for anorexia, weight loss, fever, chills, fatigue, weakness. ENT: Negative for hoarseness, difficulty swallowing , nasal congestion. CV: Negative for chest pain, angina, palpitations, dyspnea on exertion, peripheral edema.  Respiratory: Negative for dyspnea at rest, dyspnea on exertion, cough, sputum, wheezing.  GI: See history of present illness. GU:  Negative for dysuria, hematuria, urinary incontinence, urinary frequency, nocturnal urination.  Endo: Negative for unusual weight change.    Physical Examination:   BP 127/83 (BP Location: Left Arm, Patient Position: Sitting, Cuff Size: Normal)   Pulse 92   Temp 97.6 F (36.4 C) (Oral)   Wt 161 lb 2 oz (73.1 kg)   BMI 25.24 kg/m   General: Well-nourished, well-developed in no acute distress.  Eyes: No icterus. Conjunctivae pink. Mouth: Oropharyngeal mucosa moist and pink , no lesions erythema or exudate. Neck: Supple, Trachea midline Abdomen: Bowel sounds are normal, nontender, nondistended, no hepatosplenomegaly  or masses, no abdominal bruits or hernia , no rebound or guarding.   Extremities: No lower extremity edema. No clubbing or deformities. Neuro: Alert and oriented x 3.  Grossly intact. Skin: Warm and dry, no jaundice.   Psych: Alert and cooperative, normal mood and affect.   Labs: CMP     Component Value Date/Time   NA 141 08/09/2018 1157   NA 142 05/06/2011 1334   K 3.3 (L) 08/09/2018 1157   K 3.6  05/06/2011 1334   CL 103 08/09/2018 1157   CL 105 05/06/2011 1334   CO2 27 08/09/2018 1157   CO2 26 05/06/2011 1334   GLUCOSE 95 08/09/2018 1157   GLUCOSE 85 05/06/2011 1334   BUN 12 08/09/2018 1157   BUN 19 (H) 05/06/2011 1334   CREATININE 0.64 08/09/2018 1157   CREATININE 0.96 05/06/2011 1334   CALCIUM 10.0 08/09/2018 1157   CALCIUM 9.3 05/06/2011 1334   PROT 7.3 08/09/2018 1157   ALBUMIN 4.4 08/09/2018 1157   AST 20 08/09/2018 1157   ALT 6 08/09/2018 1157   ALKPHOS 60 08/09/2018 1157   BILITOT 0.8 08/09/2018 1157   GFRNONAA >60 08/09/2018 1157   GFRNONAA >60 05/06/2011 1334   GFRAA >60 08/09/2018 1157   GFRAA >60 05/06/2011 1334   Lab Results  Component Value Date   WBC 6.6 08/09/2018   HGB 11.9 (L) 08/09/2018   HCT 37.7 08/09/2018   MCV 101.1 (H) 08/09/2018   PLT 252 08/09/2018    Imaging Studies: No results found.  Assessment and Plan:   Kathy Bean is a 68 y.o. y/o female here for possible hospital follow-up for abdominal pain and GERD  Symptoms are much better on omeprazole 40 mg twice daily  (Risks of PPI use were discussed with patient including bone loss, C. Diff diarrhea, pneumonia, infections, CKD, electrolyte abnormalities.  Pt. Verbalizes understanding and chooses to continue the medication.)  Patient educated extensively on acid reflux lifestyle modification, including buying a bed wedge, not eating 3 hrs before bedtime, diet modifications, and handout given for the same.   However, patient still complaining of ongoing symptoms especially at night, including epigastric pain and regurgitation  Will order H. pylori serology for further evaluation  Due to significant symptoms at bedtime and previous weight loss and decreased appetite order PPI twice daily was initiated, will also add Pepcid at bedtime.  If symptoms not improved I have asked her to contact us and she verbalized understanding    Dr Kathy Bean

## 2018-10-19 NOTE — Patient Instructions (Signed)

## 2018-10-21 LAB — H PYLORI, IGM, IGG, IGA AB
H pylori, IgM Abs: <9 units (ref 0.0–8.9)
H. pylori, IgA Abs: <9 units (ref 0.0–8.9)
H. pylori, IgG AbS: 0.2 Index Value (ref 0.00–0.79)

## 2018-11-03 ENCOUNTER — Ambulatory Visit
Admission: RE | Admit: 2018-11-03 | Discharge: 2018-11-03 | Disposition: A | Discharge status: Home / Self Care | Payer: Medicare Other | Source: Ambulatory Visit | Attending: Oncology | Admitting: Oncology

## 2018-11-03 DIAGNOSIS — C50312 Malignant neoplasm of lower-inner quadrant of left female breast: Secondary | ICD-10-CM

## 2018-11-03 HISTORY — DX: Personal history of irradiation: Z92.3

## 2018-11-17 MED ORDER — FAMOTIDINE 40 MG PO TABS: 40.0000 mg | ORAL_TABLET | Freq: Two times a day (BID) | ORAL | 0 refills | Status: DC

## 2018-12-09 ENCOUNTER — Other Ambulatory Visit: Payer: Self-pay | Admitting: Gastroenterology

## 2019-01-06 ENCOUNTER — Other Ambulatory Visit: Payer: Self-pay | Admitting: Gastroenterology

## 2019-01-11 ENCOUNTER — Other Ambulatory Visit: Payer: Self-pay | Admitting: Gastroenterology

## 2019-02-01 NOTE — Progress Notes (Signed)
  [place]  Telephone:([phone] Fax:(336) [phone]  ID: Kathy Bean OB: Jun 20, 1950  MR#: [id number]  [id number]  Patient Care Team: Rusty Aus, MD as PCP - General (Internal Medicine)    Lloyd Huger, MD   02/01/2019 4:00 PM         This encounter was created in error - please disregard.

## 2019-02-06 ENCOUNTER — Other Ambulatory Visit: Payer: Self-pay

## 2019-02-06 NOTE — Progress Notes (Signed)
Patient pre screened for office appointment, no questions or concerns today. Patient reminded of upcoming appointment time and date. 

## 2019-02-07 ENCOUNTER — Inpatient Hospital Stay: Payer: Medicare Other

## 2019-02-07 ENCOUNTER — Inpatient Hospital Stay: Payer: Medicare Other | Admitting: Oncology

## 2019-02-11 ENCOUNTER — Other Ambulatory Visit: Payer: Self-pay | Admitting: Gastroenterology

## 2019-02-13 ENCOUNTER — Other Ambulatory Visit: Payer: Self-pay | Admitting: Gastroenterology

## 2019-02-13 NOTE — Telephone Encounter (Signed)
Last office visit 10/19/2018 epigastric pain  Last refill 12/12/2018 0 refills  No appointment is scheduled

## 2019-02-24 NOTE — Progress Notes (Signed)
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  INTERVAL HISTORY: Patient returns to clinic today for routine 76-monthevaluation.  She currently feels well and asymptomatic.  She is tolerating letrozole without significant side effects.  She has no neurologic complaints. She denies any recent fevers or illnesses. She denies any pain. She has a good appetite and denies weight loss.  She denies any chest pain, shortness of breath, cough, or hemoptysis.  She has no nausea, vomiting, constipation, or diarrhea.  She denies any melena or hematochezia.  She has no urinary complaints.  Patient feels at her baseline offers no specific complaints today.  REVIEW OF SYSTEMS:   Review of Systems  Constitutional: Negative.  Negative for fever, malaise/fatigue and weight loss.  Respiratory: Negative.  Negative for cough and shortness of breath.   Cardiovascular: Negative.  Negative for chest pain and leg swelling.  Gastrointestinal: Negative.  Negative for abdominal pain, blood in stool, constipation and melena.  Genitourinary: Negative.  Negative for dysuria and hematuria.  Musculoskeletal: Negative.  Negative for back pain.  Skin: Negative.  Negative for rash.  Neurological: Negative.  Negative for sensory change, focal weakness, weakness and headaches.  Psychiatric/Behavioral: Negative.  The patient is not nervous/anxious.    As per HPI. Otherwise, a complete review of systems is negative.   PAST MEDICAL HISTORY: Past Medical History:  Diagnosis Date  . [place]   left  . [facility] ([facility] 2016   left breast  . Depression   . Dysrhythmia    heart racing  . Family history of  adverse reaction to anesthesia    brother had difficulty going under anesthseia  . Headache    hx of migraines   period induced  . Hypertension   . Osteoarthritis   . Parkinson disease ([facility]   . Personal history of radiation therapy     PAST SURGICAL HISTORY: Past Surgical History:  Procedure Laterality Date  . BREAST BIOPSY Left 10/2014   positive  . BREAST LUMPECTOMY Left 2016   invasive mammary  . COLONOSCOPY Left 07/17/2018   Procedure: COLONOSCOPY;  Surgeon: TVirgel Manifold MD;  [facility];  Service: Endoscopy;  Laterality: Left;  . DILATION AND CURETTAGE OF UTERUS    . ESOPHAGOGASTRODUODENOSCOPY Left 07/17/2018   Procedure: ESOPHAGOGASTRODUODENOSCOPY (EGD);  Surgeon: TVirgel Manifold MD;  [facility];  Service: Endoscopy;  Laterality: Left;  . Laminectomy Posterior Cervicle Decomp W/Facetectomy & Foraminotomy     [id number] 3....Marland KitchenMarland KitchenXCISION CENTRAL LEFT DDrummondHERNIATION L 2-3; Surgeon: TLoni Dolly MD; [place] [facility] [place]: [place]; Laterality: Left  . Laminectomy Posterior Lumbar Facetectomy & Foraminotomy W/Decomp     BILATERAL LUMBAR DECOMPRESSION L2-3, L3-4, L4-5; Surgeon: TLoni Dolly MD; [place] [facility] Service: Orthopedics; Laterality: Bilateral;  . PARTIAL MASTECTOMY WITH NEEDLE LOCALIZATION Left 11/29/2014   Procedure: PARTIAL MASTECTOMY WITH NEEDLE LOCALIZATION;  Surgeon: JLeonie Green MD;  Location: [facility] ORS;  Service: General;  Laterality: Left;  . SENTINEL NODE BIOPSY Left 11/29/2014   Procedure: SENTINEL NODE BIOPSY;  Surgeon: JLeonie Green MD;  Location: [facility] ORS;  Service: General;  Laterality: Left;    FAMILY HISTORY Family History  Problem Relation Age of Onset  .  Breast cancer Maternal Aunt   . Breast cancer Paternal Aunt   . Coronary artery disease Mother   . Heart attack Mother   . Coronary artery disease Paternal Grandmother        ADVANCED DIRECTIVES:    HEALTH MAINTENANCE:  Social History   Tobacco Use  . Smoking status: Former Smoker    Quit date: 11/22/1999    Years since quitting: 19.2  . Smokeless tobacco: Never Used  Substance Use Topics  . Alcohol use: Yes    Alcohol/week: 14.0 standard drinks    Types: 14 Glasses of wine per week  . Drug use: No     No Known Allergies  Current Outpatient Medications  Medication Sig Dispense Refill  . anastrozole (ARIMIDEX) 1 MG tablet TAKE 1 TABLET BY MOUTH EVERY DAY (Patient taking differently: Take 1 mg by mouth daily. ) 90 tablet 3  . calcium carbonate (TUMS EX) 750 MG chewable tablet Chew 1 tablet by mouth 3 (three) times daily.    . carbidopa-levodopa (SINEMET IR) 25-100 MG tablet Take 2 tablets by mouth 3 (three) times daily.     . citalopram (CELEXA) 20 MG tablet Take 1 tablet by mouth 1 day or 1 dose.    . Cyanocobalamin (RA VITAMIN B-12 TR) 1000 MCG TBCR Take 1,000 mcg by mouth daily.     . entacapone (COMTAN) 200 MG tablet Take 200 mg by mouth 2 (two) times daily.    . famotidine (PEPCID) 20 MG tablet TAKE 1 TABLET BY MOUTH EVERYDAY AT BEDTIME 90 tablet 0  . potassium chloride (K-DUR) 10 MEQ tablet Take 10 mEq by mouth 2 (two) times daily.     Marland Kitchen triamterene-hydrochlorothiazide (MAXZIDE-25) 37.5-25 MG tablet Take 2 tablets by mouth daily.      No current facility-administered medications for this visit.    OBJECTIVE: Vitals:   03/03/19 1425  BP: 121/85  Pulse: 88  Resp: 16  Temp: 97.9 F (36.6 C)     Body mass index is 26.27 kg/m.    ECOG FS:0 - Asymptomatic  General: Well-developed, well-nourished, no acute distress. Eyes: Pink conjunctiva, anicteric sclera. HEENT: Normocephalic, moist mucous membranes. Breast: Exam deferred today. Lungs: No audible wheezing or coughing. Heart: Regular rate and rhythm. Abdomen: Soft, nontender, no obvious distention. Musculoskeletal: No edema, cyanosis, or clubbing. Neuro: Alert, answering all questions appropriately. Cranial nerves grossly intact.  Skin: No rashes or petechiae noted. Psych: Normal affect.  LAB RESULTS:  Lab Results  Component Value Date   NA 141 08/09/2018   K 3.3 (L) 08/09/2018   CL 103 08/09/2018   CO2 27 08/09/2018   GLUCOSE 95 08/09/2018   BUN 12 08/09/2018   CREATININE 0.64 08/09/2018   CALCIUM 10.0 08/09/2018   PROT 7.3 08/09/2018   ALBUMIN 4.4 08/09/2018   AST 20 08/09/2018   ALT 6 08/09/2018   ALKPHOS 60 08/09/2018   BILITOT 0.8 08/09/2018   GFRNONAA >60 08/09/2018   GFRAA >60 08/09/2018    Lab Results  Component Value Date   WBC 6.5 03/03/2019   NEUTROABS 4.0 03/03/2019   HGB 13.6 03/03/2019   HCT 43.3 03/03/2019   MCV 94.5 03/03/2019   PLT 237 03/03/2019     STUDIES: No results found.  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  PLAN:    1. 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 Oncotype was ordered, but was denied by insurance. Given the size of patient's tumor, she is likely low risk and she did not receive adjuvant chemotherapy.  Patient completed adjuvant XRT.  Continue letrozole for a total of 5 years completing treatment in January 2022.  Her most recent mammogram on November 03, 2018 was reported as BI-RADS 2.  Repeat in September 2021.  Return to clinic in 6 months for routine evaluation. 2. Osteopenia: Patient's most recent bone mineral density on March 24, 2018 reported a T score of -1.5 which is essentially unchanged from 1 year prior when it was -1.4.  No intervention needed.  Continue calcium and vitamin D supplementation.  Repeat in February 2021. 3.  Anemia: Resolved.  Previously, EGD and colonoscopy did not reveal definitive source of bleeding and her laboratory work-up was unrevealing.  Patient expressed understanding and was in agreement with this plan. She  also understands that She can call clinic at any time with any questions, concerns, or complaints.   Breast cancer, left [facility])   Staging form: Breast, AJCC 7th Edition     Pathologic stage from 12/04/2014: Stage IA (T1b, N0, cM0) - Signed by Lloyd Huger, MD on 12/04/2014   Lloyd Huger, MD   03/04/2019 7:12 AM

## 2019-03-02 ENCOUNTER — Other Ambulatory Visit: Payer: Self-pay

## 2019-03-02 ENCOUNTER — Other Ambulatory Visit: Payer: Self-pay | Admitting: *Deleted

## 2019-03-02 DIAGNOSIS — D649 Anemia, unspecified: Secondary | ICD-10-CM

## 2019-03-03 ENCOUNTER — Encounter: Payer: Self-pay | Admitting: Oncology

## 2019-03-03 ENCOUNTER — Inpatient Hospital Stay (HOSPITAL_BASED_OUTPATIENT_CLINIC_OR_DEPARTMENT_OTHER): Payer: Medicare Other | Admitting: Oncology

## 2019-03-03 ENCOUNTER — Inpatient Hospital Stay: Payer: Medicare Other | Attending: Oncology

## 2019-03-03 ENCOUNTER — Other Ambulatory Visit: Payer: Self-pay

## 2019-03-03 VITALS — BP 121/85 | HR 88 | Temp 97.9°F | Resp 16 | Wt 167.7 lb

## 2019-03-03 DIAGNOSIS — Z79811 Long term (current) use of aromatase inhibitors: Secondary | ICD-10-CM | POA: Insufficient documentation

## 2019-03-03 DIAGNOSIS — I10 Essential (primary) hypertension: Secondary | ICD-10-CM | POA: Insufficient documentation

## 2019-03-03 DIAGNOSIS — M1990 Unspecified osteoarthritis, unspecified site: Secondary | ICD-10-CM | POA: Diagnosis not present

## 2019-03-03 DIAGNOSIS — D649 Anemia, unspecified: Secondary | ICD-10-CM

## 2019-03-03 DIAGNOSIS — C50312 Malignant neoplasm of lower-inner quadrant of left female breast: Secondary | ICD-10-CM

## 2019-03-03 DIAGNOSIS — Z803 Family history of malignant neoplasm of breast: Secondary | ICD-10-CM | POA: Insufficient documentation

## 2019-03-03 DIAGNOSIS — Z87891 Personal history of nicotine dependence: Secondary | ICD-10-CM | POA: Diagnosis not present

## 2019-03-03 DIAGNOSIS — Z170 Estrogen receptor positive status [ER+]: Secondary | ICD-10-CM | POA: Insufficient documentation

## 2019-03-03 DIAGNOSIS — M8580 Other specified disorders of bone density and structure, unspecified site: Secondary | ICD-10-CM | POA: Diagnosis not present

## 2019-03-03 DIAGNOSIS — G20 Parkinson's disease: Secondary | ICD-10-CM | POA: Insufficient documentation

## 2019-03-03 DIAGNOSIS — Z923 Personal history of irradiation: Secondary | ICD-10-CM | POA: Diagnosis not present

## 2019-03-03 DIAGNOSIS — Z79899 Other long term (current) drug therapy: Secondary | ICD-10-CM | POA: Diagnosis not present

## 2019-03-03 DIAGNOSIS — F329 Major depressive disorder, single episode, unspecified: Secondary | ICD-10-CM | POA: Diagnosis not present

## 2019-03-03 LAB — CBC WITH DIFFERENTIAL/PLATELET
Abs Immature Granulocytes: 0.02 10*3/uL (ref 0.00–0.07)
Basophils Absolute: 0 10*3/uL (ref 0.0–0.1)
Basophils Relative: 1 %
Eosinophils Absolute: 0.1 10*3/uL (ref 0.0–0.5)
Eosinophils Relative: 1 %
HCT: 43.3 % (ref 36.0–46.0)
Hemoglobin: 13.6 g/dL (ref 12.0–15.0)
Immature Granulocytes: 0 %
Lymphocytes Relative: 29 %
Lymphs Abs: 1.9 10*3/uL (ref 0.7–4.0)
MCH: 29.7 pg (ref 26.0–34.0)
MCHC: 31.4 g/dL (ref 30.0–36.0)
MCV: 94.5 fL (ref 80.0–100.0)
Monocytes Absolute: 0.6 10*3/uL (ref 0.1–1.0)
Monocytes Relative: 9 %
Neutro Abs: 4 10*3/uL (ref 1.7–7.7)
Neutrophils Relative %: 60 %
Platelets: 237 10*3/uL (ref 150–400)
RBC: 4.58 MIL/uL (ref 3.87–5.11)
RDW: 13.1 % (ref 11.5–15.5)
WBC: 6.5 10*3/uL (ref 4.0–10.5)
nRBC: 0 % (ref 0.0–0.2)

## 2019-03-03 NOTE — Progress Notes (Signed)
Patient does not offer any problems today.  

## 2019-03-28 ENCOUNTER — Encounter: Payer: Self-pay | Admitting: Oncology

## 2019-03-28 ENCOUNTER — Ambulatory Visit
Admission: RE | Admit: 2019-03-28 | Discharge: 2019-03-28 | Disposition: A | Discharge status: Home / Self Care | Payer: Medicare Other | Source: Ambulatory Visit | Attending: Oncology | Admitting: Oncology

## 2019-03-28 DIAGNOSIS — Z13820 Encounter for screening for osteoporosis: Secondary | ICD-10-CM | POA: Diagnosis present

## 2019-03-28 DIAGNOSIS — Z780 Asymptomatic menopausal state: Secondary | ICD-10-CM | POA: Diagnosis not present

## 2019-03-28 DIAGNOSIS — Z79811 Long term (current) use of aromatase inhibitors: Secondary | ICD-10-CM | POA: Insufficient documentation

## 2019-04-01 ENCOUNTER — Ambulatory Visit: Payer: Medicare Other | Attending: Internal Medicine

## 2019-04-01 DIAGNOSIS — Z23 Encounter for immunization: Secondary | ICD-10-CM | POA: Insufficient documentation

## 2019-04-01 NOTE — Progress Notes (Signed)
   Covid-19 Vaccination Clinic  Name:  Kathy Bean    MRN: [id number] DOB: 1950/09/05  04/01/2019  [name] was observed post Covid-19 immunization for 15 minutes without incidence. She was provided with Vaccine Information Sheet and instruction to access the V-Safe system.   [name] was instructed to call 911 with any severe reactions post vaccine: Marland Kitchen Difficulty breathing  . Swelling of your face and throat  . A fast heartbeat  . A bad rash all over your body  . Dizziness and weakness    Immunizations Administered    Name Date Dose VIS Date Route   Pfizer COVID-19 Vaccine 04/01/2019  1:46 PM 0.3 mL 01/20/2019 Intramuscular   Manufacturer: [place]   Lot: [id number]   [place]: [id number]

## 2019-04-15 ENCOUNTER — Other Ambulatory Visit: Payer: Self-pay | Admitting: Oncology

## 2019-04-25 ENCOUNTER — Ambulatory Visit: Payer: Medicare Other | Attending: Internal Medicine

## 2019-04-25 DIAGNOSIS — Z23 Encounter for immunization: Secondary | ICD-10-CM

## 2019-04-25 NOTE — Progress Notes (Signed)
   Covid-19 Vaccination Clinic  Name:  Kathy Bean    MRN: [id number] DOB: 01/01/1951  04/25/2019  [name] was observed post Covid-19 immunization for 15 minutes without incident. She was provided with Vaccine Information Sheet and instruction to access the V-Safe system.   [name] was instructed to call 911 with any severe reactions post vaccine: Marland Kitchen Difficulty breathing  . Swelling of face and throat  . A fast heartbeat  . A bad rash all over body  . Dizziness and weakness   Immunizations Administered    Name Date Dose VIS Date Route   Pfizer COVID-19 Vaccine 04/25/2019  1:39 PM 0.3 mL 01/20/2019 Intramuscular   Manufacturer: [place]   Lot: [id number]   [place]: [id number]

## 2019-05-10 ENCOUNTER — Other Ambulatory Visit: Payer: Self-pay | Admitting: Gastroenterology

## 2019-07-17 ENCOUNTER — Encounter: Payer: Self-pay | Admitting: Oncology

## 2019-08-08 ENCOUNTER — Other Ambulatory Visit: Payer: Self-pay | Admitting: Neurology

## 2019-08-08 ENCOUNTER — Other Ambulatory Visit (HOSPITAL_COMMUNITY): Payer: Self-pay | Admitting: Neurology

## 2019-08-08 DIAGNOSIS — M5090 Cervical disc disorder, unspecified, unspecified cervical region: Secondary | ICD-10-CM

## 2019-08-22 ENCOUNTER — Other Ambulatory Visit: Payer: Self-pay

## 2019-08-22 ENCOUNTER — Ambulatory Visit
Admission: RE | Admit: 2019-08-22 | Discharge: 2019-08-22 | Disposition: A | Discharge status: Home / Self Care | Payer: Medicare Other | Source: Ambulatory Visit | Attending: Neurology | Admitting: Neurology

## 2019-08-22 DIAGNOSIS — M5090 Cervical disc disorder, unspecified, unspecified cervical region: Secondary | ICD-10-CM | POA: Diagnosis not present

## 2019-09-01 ENCOUNTER — Ambulatory Visit: Payer: Medicare Other | Admitting: Oncology

## 2019-09-11 ENCOUNTER — Inpatient Hospital Stay: Payer: Medicare Other | Admitting: Oncology

## 2019-09-19 ENCOUNTER — Ambulatory Visit: Payer: Medicare Other

## 2019-09-22 NOTE — Progress Notes (Deleted)
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  INTERVAL HISTORY: Patient returns to clinic today for routine 28-monthevaluation.  She currently feels well and asymptomatic.  She is tolerating letrozole without significant side effects.  She has no neurologic complaints. She denies any recent fevers or illnesses. She denies any pain. She has a good appetite and denies weight loss.  She denies any chest pain, shortness of breath, cough, or hemoptysis.  She has no nausea, vomiting, constipation, or diarrhea.  She denies any melena or hematochezia.  She has no urinary complaints.  Patient feels at her baseline offers no specific complaints today.  REVIEW OF SYSTEMS:   Review of Systems  Constitutional: Negative.  Negative for fever, malaise/fatigue and weight loss.  Respiratory: Negative.  Negative for cough and shortness of breath.   Cardiovascular: Negative.  Negative for chest pain and leg swelling.  Gastrointestinal: Negative.  Negative for abdominal pain, blood in stool, constipation and melena.  Genitourinary: Negative.  Negative for dysuria and hematuria.  Musculoskeletal: Negative.  Negative for back pain.  Skin: Negative.  Negative for rash.  Neurological: Negative.  Negative for sensory change, focal weakness, weakness and headaches.  Psychiatric/Behavioral: Negative.  The patient is not nervous/anxious.    As per HPI. Otherwise, a complete review of systems is negative.   PAST MEDICAL HISTORY: Past Medical History:  Diagnosis Date  . Breast cancer ([place] 11/2014   left  . Cancer ([facility] 2016   left breast  . Depression   . Dysrhythmia    heart racing  . Family history of  adverse reaction to anesthesia    brother had difficulty going under anesthseia  . Headache    hx of migraines   period induced  . Hypertension   . Osteoarthritis   . Parkinson disease ([place]   . Personal history of radiation therapy     PAST SURGICAL HISTORY: Past Surgical History:  Procedure Laterality Date  . BREAST BIOPSY Left 10/2014   positive  . BREAST LUMPECTOMY Left 2016   invasive mammary  . COLONOSCOPY Left 07/17/2018   Procedure: COLONOSCOPY;  Surgeon: TVirgel Manifold MD;  [facility];  Service: Endoscopy;  Laterality: Left;  . DILATION AND CURETTAGE OF UTERUS    . ESOPHAGOGASTRODUODENOSCOPY Left 07/17/2018   Procedure: ESOPHAGOGASTRODUODENOSCOPY (EGD);  Surgeon: TVirgel Manifold MD;  [facility];  Service: Endoscopy;  Laterality: Left;  . Laminectomy Posterior Cervicle Decomp W/Facetectomy & Foraminotomy     [id number] 3....Marland KitchenMarland KitchenXCISION CENTRAL LEFT [place] L 2-3; Surgeon: TLoni Dolly MD; [place] Service: Orthopedics; Laterality: Left  . Laminectomy Posterior Lumbar Facetectomy & Foraminotomy W/Decomp     BILATERAL LUMBAR DECOMPRESSION L2-3, L3-4, L4-5; Surgeon: TLoni Dolly MD; [place] Service: Orthopedics; Laterality: Bilateral;  . PARTIAL MASTECTOMY WITH NEEDLE LOCALIZATION Left 11/29/2014   Procedure: PARTIAL MASTECTOMY WITH NEEDLE LOCALIZATION;  Surgeon: JLeonie Green MD;  Location: [facility] ORS;  Service: General;  Laterality: Left;  . SENTINEL NODE BIOPSY Left 11/29/2014   Procedure: SENTINEL NODE BIOPSY;  Surgeon: JLeonie Green MD;  Location: [facility] ORS;  Service: General;  Laterality: Left;    FAMILY HISTORY Family History  Problem Relation Age of Onset  .  Breast cancer Maternal Aunt   . Breast cancer Paternal Aunt   . Coronary artery disease Mother   . Heart attack Mother   . Coronary artery disease Paternal Grandmother        ADVANCED DIRECTIVES:    HEALTH  MAINTENANCE: Social History   Tobacco Use  . Smoking status: Former Smoker    Quit date: 11/22/1999    Years since quitting: 19.8  . Smokeless tobacco: Never Used  Substance Use Topics  . Alcohol use: Yes    Alcohol/week: 14.0 standard drinks    Types: 14 Glasses of wine per week  . Drug use: No     No Known Allergies  Current Outpatient Medications  Medication Sig Dispense Refill  . anastrozole (ARIMIDEX) 1 MG tablet TAKE 1 TABLET BY MOUTH EVERY DAY 90 tablet 3  . calcium carbonate (TUMS EX) 750 MG chewable tablet Chew 1 tablet by mouth 3 (three) times daily.    . carbidopa-levodopa (SINEMET IR) 25-100 MG tablet Take 2 tablets by mouth 3 (three) times daily.     . citalopram (CELEXA) 20 MG tablet Take 1 tablet by mouth 1 day or 1 dose.    . Cyanocobalamin (RA VITAMIN B-12 TR) 1000 MCG TBCR Take 1,000 mcg by mouth daily.     . entacapone (COMTAN) 200 MG tablet Take 200 mg by mouth 2 (two) times daily.    . famotidine (PEPCID) 20 MG tablet TAKE 1 TABLET BY MOUTH EVERYDAY AT BEDTIME 90 tablet 0  . potassium chloride (K-DUR) 10 MEQ tablet Take 10 mEq by mouth 2 (two) times daily.     Marland Kitchen triamterene-hydrochlorothiazide (MAXZIDE-25) 37.5-25 MG tablet Take 2 tablets by mouth daily.      No current facility-administered medications for this visit.    OBJECTIVE: There were no vitals filed for this visit.   There is no height or weight on file to calculate BMI.    ECOG FS:0 - Asymptomatic  General: Well-developed, well-nourished, no acute distress. Eyes: Pink conjunctiva, anicteric sclera. HEENT: Normocephalic, moist mucous membranes. Breast: Exam deferred today. Lungs: No audible wheezing or coughing. Heart: Regular rate and rhythm. Abdomen: Soft, nontender, no obvious distention. Musculoskeletal: No edema, cyanosis, or clubbing. Neuro: Alert, answering all questions appropriately. Cranial nerves grossly intact. Skin: No rashes or petechiae noted. Psych: Normal affect.  LAB  RESULTS:  Lab Results  Component Value Date   NA 141 08/09/2018   K 3.3 (L) 08/09/2018   CL 103 08/09/2018   CO2 27 08/09/2018   GLUCOSE 95 08/09/2018   BUN 12 08/09/2018   CREATININE 0.64 08/09/2018   CALCIUM 10.0 08/09/2018   PROT 7.3 08/09/2018   ALBUMIN 4.4 08/09/2018   AST 20 08/09/2018   ALT 6 08/09/2018   ALKPHOS 60 08/09/2018   BILITOT 0.8 08/09/2018   GFRNONAA >60 08/09/2018   GFRAA >60 08/09/2018    Lab Results  Component Value Date   WBC 6.5 03/03/2019   NEUTROABS 4.0 03/03/2019   HGB 13.6 03/03/2019   HCT 43.3 03/03/2019   MCV 94.5 03/03/2019   PLT 237 03/03/2019     STUDIES: No results found.  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  PLAN:    1. 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 Oncotype was ordered, but was denied by insurance. Given the size of patient's tumor,  she is likely low risk and she did not receive adjuvant chemotherapy.  Patient completed adjuvant XRT.  Continue letrozole for a total of 5 years completing treatment in January 2022.  Her most recent mammogram on November 03, 2018 was reported as BI-RADS 2.  Repeat in September 2021.  Return to clinic in 6 months for routine evaluation. 2. Osteopenia: Patient's most recent bone mineral density on March 24, 2018 reported a T score of -1.5 which is essentially unchanged from 1 year prior when it was -1.4.  No intervention needed.  Continue calcium and vitamin D supplementation.  Repeat in February 2021. 3.  Anemia: Resolved.  Previously, EGD and colonoscopy did not reveal definitive source of bleeding and her laboratory work-up was unrevealing.  Patient expressed understanding and was in agreement with this plan. She also understands that She can call clinic at any time with any  questions, concerns, or complaints.   Breast cancer, left [facility])   Staging form: Breast, AJCC 7th Edition     Pathologic stage from 12/04/2014: Stage IA (T1b, N0, cM0) - Signed by Lloyd Huger, MD on 12/04/2014   Lloyd Huger, MD   09/22/2019 6:58 AM

## 2019-09-26 ENCOUNTER — Ambulatory Visit: Payer: Medicare Other

## 2019-09-26 ENCOUNTER — Inpatient Hospital Stay: Payer: Medicare Other | Admitting: Oncology

## 2019-09-28 ENCOUNTER — Ambulatory Visit: Payer: Medicare Other

## 2019-09-29 NOTE — Progress Notes (Deleted)
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  INTERVAL HISTORY: Patient returns to clinic today for routine 61-month evaluation.  She currently feels well and asymptomatic.  She is tolerating letrozole without significant side effects.  She has no neurologic complaints. She denies any recent fevers or illnesses. She denies any pain. She has a good appetite and denies weight loss.  She denies any chest pain, shortness of breath, cough, or hemoptysis.  She has no nausea, vomiting, constipation, or diarrhea.  She denies any melena or hematochezia.  She has no urinary complaints.  Patient feels at her baseline offers no specific complaints today.  REVIEW OF SYSTEMS:   Review of Systems  Constitutional: Negative.  Negative for fever, malaise/fatigue and weight loss.  Respiratory: Negative.  Negative for cough and shortness of breath.   Cardiovascular: Negative.  Negative for chest pain and leg swelling.  Gastrointestinal: Negative.  Negative for abdominal pain, blood in stool, constipation and melena.  Genitourinary: Negative.  Negative for dysuria and hematuria.  Musculoskeletal: Negative.  Negative for back pain.  Skin: Negative.  Negative for rash.  Neurological: Negative.  Negative for sensory change, focal weakness, weakness and headaches.  Psychiatric/Behavioral: Negative.  The patient is not nervous/anxious.    As per HPI. Otherwise, a complete review of systems is negative.   PAST MEDICAL HISTORY: Past Medical History:  Diagnosis Date  . Breast cancer ([place]) 11/2014   left  . Cancer [facility]) 2016   left breast  . Depression   . Dysrhythmia    heart racing  . Family history of  adverse reaction to anesthesia    brother had difficulty going under anesthseia  . Headache    hx of migraines   period induced  . Hypertension   . Osteoarthritis   . Parkinson disease ([place])   . Personal history of radiation therapy     PAST SURGICAL HISTORY: Past Surgical History:  Procedure Laterality Date  . BREAST BIOPSY Left 10/2014   positive  . BREAST LUMPECTOMY Left 2016   invasive mammary  . COLONOSCOPY Left 07/17/2018   Procedure: COLONOSCOPY;  Surgeon: Virgel Manifold, MD;  Location[facility];  Service: Endoscopy;  Laterality: Left;  . DILATION AND CURETTAGE OF UTERUS    . ESOPHAGOGASTRODUODENOSCOPY Left 07/17/2018   Procedure: ESOPHAGOGASTRODUODENOSCOPY (EGD);  Surgeon: Virgel Manifold, MD;  Location: [facility] END[facility]COPY;  Service: Endoscopy;  Laterality: Left;  . Laminectomy Posterior Cervicle Decomp W/Facetectomy & Foraminotomy     [id number] x 3...Marland KitchenMarland KitchenEXCISION CENTRAL LEFT [place] HERNIATION L 2-3; Surgeon: Loni Dolly, MD; Location: [place]; Service: Orthopedics; Laterality: Left  . Laminectomy Posterior Lumbar Facetectomy & Foraminotomy W/Decomp     BILATERAL LUMBAR DECOMPRESSION L2-3, L3-4, L4-5; Surgeon: Loni Dolly, MD; Location: [place]; Service: Orthopedics; Laterality: Bilateral;  . PARTIAL MASTECTOMY WITH NEEDLE LOCALIZATION Left 11/29/2014   Procedure: PARTIAL MASTECTOMY WITH NEEDLE LOCALIZATION;  Surgeon: Leonie Green, MD;  Location: [facility] ORS;  Service: General;  Laterality: Left;  . SENTINEL NODE BIOPSY Left 11/29/2014   Procedure: SENTINEL NODE BIOPSY;  Surgeon: Leonie Green, MD;  Location: [facility] ORS;  Service: General;  Laterality: Left;    FAMILY HISTORY Family History  Problem Relation Age of Onset  .  Breast cancer Maternal Aunt   . Breast cancer Paternal Aunt   . Coronary artery disease Mother   . Heart attack Mother   . Coronary artery disease Paternal Grandmother        ADVANCED DIRECTIVES:    HEALTH  MAINTENANCE: Social History   Tobacco Use  . Smoking status: Former Smoker    Quit date: 11/22/1999    Years since quitting: 19.8  . Smokeless tobacco: Never Used  Substance Use Topics  . Alcohol use: Yes    Alcohol/week: 14.0 standard drinks    Types: 14 Glasses of wine per week  . Drug use: No     No Known Allergies  Current Outpatient Medications  Medication Sig Dispense Refill  . anastrozole (ARIMIDEX) 1 MG tablet TAKE 1 TABLET BY MOUTH EVERY DAY 90 tablet 3  . calcium carbonate (TUMS EX) 750 MG chewable tablet Chew 1 tablet by mouth 3 (three) times daily.    . carbidopa-levodopa (SINEMET IR) 25-100 MG tablet Take 2 tablets by mouth 3 (three) times daily.     . citalopram (CELEXA) 20 MG tablet Take 1 tablet by mouth 1 day or 1 dose.    . Cyanocobalamin (RA VITAMIN B-12 TR) 1000 MCG TBCR Take 1,000 mcg by mouth daily.     . entacapone (COMTAN) 200 MG tablet Take 200 mg by mouth 2 (two) times daily.    . famotidine (PEPCID) 20 MG tablet TAKE 1 TABLET BY MOUTH EVERYDAY AT BEDTIME 90 tablet 0  . potassium chloride (K-DUR) 10 MEQ tablet Take 10 mEq by mouth 2 (two) times daily.     Marland Kitchen triamterene-hydrochlorothiazide (MAXZIDE-25) 37.5-25 MG tablet Take 2 tablets by mouth daily.      No current facility-administered medications for this visit.    OBJECTIVE: There were no vitals filed for this visit.   There is no height or weight on file to calculate BMI.    ECOG FS:0 - Asymptomatic  General: Well-developed, well-nourished, no acute distress. Eyes: Pink conjunctiva, anicteric sclera. HEENT: Normocephalic, moist mucous membranes. Breast: Exam deferred today. Lungs: No audible wheezing or coughing. Heart: Regular rate and rhythm. Abdomen: Soft, nontender, no obvious distention. Musculoskeletal: No edema, cyanosis, or clubbing. Neuro: Alert, answering all questions appropriately. Cranial nerves grossly intact. Skin: No rashes or petechiae noted. Psych: Normal affect.  LAB  RESULTS:  Lab Results  Component Value Date   NA 141 08/09/2018   K 3.3 (L) 08/09/2018   CL 103 08/09/2018   CO2 27 08/09/2018   GLUCOSE 95 08/09/2018   BUN 12 08/09/2018   CREATININE 0.64 08/09/2018   CALCIUM 10.0 08/09/2018   PROT 7.3 08/09/2018   ALBUMIN 4.4 08/09/2018   AST 20 08/09/2018   ALT 6 08/09/2018   ALKPHOS 60 08/09/2018   BILITOT 0.8 08/09/2018   GFRNONAA >60 08/09/2018   GFRAA >60 08/09/2018    Lab Results  Component Value Date   WBC 6.5 03/03/2019   NEUTROABS 4.0 03/03/2019   HGB 13.6 03/03/2019   HCT 43.3 03/03/2019   MCV 94.5 03/03/2019   PLT 237 03/03/2019     STUDIES: No results found.  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  PLAN:    1. 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 Oncotype was ordered, but was denied by insurance. Given the size of patient's tumor,  she is likely low risk and she did not receive adjuvant chemotherapy.  Patient completed adjuvant XRT.  Continue letrozole for a total of 5 years completing treatment in January 2022.  Her most recent mammogram on November 03, 2018 was reported as BI-RADS 2.  Repeat in September 2021.  Return to clinic in 6 months for routine evaluation. 2. Osteopenia: Patient's most recent bone mineral density on March 24, 2018 reported a T score of -1.5 which is essentially unchanged from 1 year prior when it was -1.4.  No intervention needed.  Continue calcium and vitamin D supplementation.  Repeat in February 2021. 3.  Anemia: Resolved.  Previously, EGD and colonoscopy did not reveal definitive source of bleeding and her laboratory work-up was unrevealing.  Patient expressed understanding and was in agreement with this plan. She also understands that She can call clinic at any time with any  questions, concerns, or complaints.   Breast cancer, left [facility])   Staging form: Breast, AJCC 7th Edition     Pathologic stage from 12/04/2014: Stage IA (T1b, N0, cM0) - Signed by Lloyd Huger, MD on 12/04/2014   Lloyd Huger, MD   09/29/2019 1:04 PM

## 2019-10-06 ENCOUNTER — Inpatient Hospital Stay: Payer: Medicare Other | Admitting: Oncology

## 2019-10-06 DIAGNOSIS — C50312 Malignant neoplasm of lower-inner quadrant of left female breast: Secondary | ICD-10-CM

## 2019-10-09 ENCOUNTER — Inpatient Hospital Stay: Payer: Medicare Other | Admitting: Oncology

## 2019-10-10 ENCOUNTER — Other Ambulatory Visit: Payer: Self-pay | Admitting: Internal Medicine

## 2019-10-19 ENCOUNTER — Other Ambulatory Visit: Payer: Self-pay | Admitting: Internal Medicine

## 2019-10-19 DIAGNOSIS — Z853 Personal history of malignant neoplasm of breast: Secondary | ICD-10-CM

## 2019-11-06 ENCOUNTER — Ambulatory Visit
Admission: RE | Admit: 2019-11-06 | Discharge: 2019-11-06 | Disposition: A | Discharge status: Home / Self Care | Payer: Medicare Other | Source: Ambulatory Visit | Attending: Internal Medicine | Admitting: Internal Medicine

## 2019-11-06 ENCOUNTER — Other Ambulatory Visit: Payer: Self-pay

## 2019-11-06 DIAGNOSIS — Z853 Personal history of malignant neoplasm of breast: Secondary | ICD-10-CM | POA: Diagnosis not present

## 2020-06-10 ENCOUNTER — Encounter: Payer: Self-pay | Admitting: Occupational Therapy

## 2020-06-10 ENCOUNTER — Ambulatory Visit: Payer: Medicare Other | Admitting: Speech Pathology

## 2020-06-10 ENCOUNTER — Ambulatory Visit: Payer: Medicare Other | Attending: Neurology | Admitting: Occupational Therapy

## 2020-06-10 DIAGNOSIS — R2681 Unsteadiness on feet: Secondary | ICD-10-CM | POA: Diagnosis present

## 2020-06-10 DIAGNOSIS — G20 Parkinson's disease: Secondary | ICD-10-CM

## 2020-06-10 DIAGNOSIS — G8929 Other chronic pain: Secondary | ICD-10-CM | POA: Insufficient documentation

## 2020-06-10 DIAGNOSIS — G20A1 Parkinson's disease without dyskinesia, without mention of fluctuations: Secondary | ICD-10-CM

## 2020-06-10 DIAGNOSIS — R471 Dysarthria and anarthria: Secondary | ICD-10-CM | POA: Diagnosis present

## 2020-06-10 DIAGNOSIS — R1312 Dysphagia, oropharyngeal phase: Secondary | ICD-10-CM | POA: Diagnosis present

## 2020-06-10 DIAGNOSIS — R278 Other lack of coordination: Secondary | ICD-10-CM | POA: Insufficient documentation

## 2020-06-10 DIAGNOSIS — R262 Difficulty in walking, not elsewhere classified: Secondary | ICD-10-CM | POA: Diagnosis present

## 2020-06-10 DIAGNOSIS — M25511 Pain in right shoulder: Secondary | ICD-10-CM | POA: Diagnosis present

## 2020-06-10 DIAGNOSIS — M6281 Muscle weakness (generalized): Secondary | ICD-10-CM | POA: Diagnosis present

## 2020-06-11 NOTE — Therapy (Signed)
[place] MAIN [facility] SERVICES [address] [place], [place], [address] Phone: [phone]   Fax:  [phone]  Speech Language Pathology Evaluation  Patient Details  Name: Kathy Bean MRN: [id number] Date of Birth: 23-Apr-1950 Referring Provider (SLP): Dr. Jennings Books   Encounter Date: 06/10/2020   End of Session - 06/10/20 1701    Visit Number 1    Number of Visits 17    Date for SLP Re-Evaluation 09/08/20    Authorization Type Medicare    Authorization - Visit Number 1    Progress Note Due on Visit 10    SLP Start Time 1400    SLP Stop Time  1500    SLP Time Calculation (min) 60 min    Activity Tolerance Patient tolerated treatment well           Past Medical History:  Diagnosis Date  . Breast cancer ([place]) 11/2014   left  . Cancer [facility]) 2016   left breast  . Depression   . Dysrhythmia    heart racing  . Family history of adverse reaction to anesthesia    brother had difficulty going under anesthseia  . Headache    hx of migraines   period induced  . Hypertension   . Osteoarthritis   . Parkinson disease ([place])   . Personal history of radiation therapy     Past Surgical History:  Procedure Laterality Date  . BREAST BIOPSY Left 10/2014   positive  . BREAST LUMPECTOMY Left 2016   invasive mammary  . COLONOSCOPY Left 07/17/2018   Procedure: COLONOSCOPY;  Surgeon: Virgel Manifold, MD;  Location: [facility] ENDOSCOPY;  Service: Endoscopy;  Laterality: Left;  . DILATION AND CURETTAGE OF UTERUS    . ESOPHAGOGASTRODUODENOSCOPY Left 07/17/2018   Procedure: ESOPHAGOGASTRODUODENOSCOPY (EGD);  Surgeon: Virgel Manifold, MD;  Location: [facility] ENDOSCOPY;  Service: Endoscopy;  Laterality: Left;  . Laminectomy Posterior Cervicle Decomp W/Facetectomy & Foraminotomy     [id number] x 3...Marland KitchenMarland KitchenEXCISION CENTRAL LEFT [place] HERNIATION L 2-3; Surgeon: Loni Dolly, MD; Location: [place]; Service: Orthopedics; Laterality: Left  . Laminectomy  Posterior Lumbar Facetectomy & Foraminotomy W/Decomp     BILATERAL LUMBAR DECOMPRESSION L2-3, L3-4, L4-5; Surgeon: Loni Dolly, MD; Location: [place]; Service: Orthopedics; Laterality: Bilateral;  . PARTIAL MASTECTOMY WITH NEEDLE LOCALIZATION Left 11/29/2014   Procedure: PARTIAL MASTECTOMY WITH NEEDLE LOCALIZATION;  Surgeon: Leonie Green, MD;  Location: [facility] ORS;  Service: General;  Laterality: Left;  . SENTINEL NODE BIOPSY Left 11/29/2014   Procedure: SENTINEL NODE BIOPSY;  Surgeon: Leonie Green, MD;  Location: [facility] ORS;  Service: General;  Laterality: Left;    There were no vitals filed for this visit.   Subjective Assessment - 06/10/20 1411    Subjective "My voice is softer- not as loud."    Pain Score 3     Pain Location Shoulder    Pain Orientation Right              SLP Evaluation [facility] - 06/10/20 1411      SLP Visit Information   SLP Received On 06/10/20    Referring Provider (SLP) Dr. Jennings Books    Onset Date 04/08/2020   referral date   Medical Diagnosis idiopathic Parkinson's disease      Subjective   Patient/Family Stated Goal improve communication with family      General Information   HPI Kathy Bean is a 70 y.o. female referred by Dr. Manuella Ghazi for LSVT-LOUD to  idiopathic Parkinson's disease. Per chart review pt with hypophonia, dysphagia.    Behavioral/Cognition alert, cooperative, pleasant mood    Mobility Status uses a rollator, needed transport chair for return to car, tires easily      Balance Screen   Has the patient fallen in the past 6 months No      Prior Functional Status   Cognitive/Linguistic Baseline Baseline deficits   memory deficits, worse after anesthesia   Baseline deficit details pt also reports difficulty focusing at times     Lives With Spouse    Available Support Family    Vocation Retired      Associate Professor   Overall Cognitive Status History of cognitive impairments - at baseline    Attention Sustained    Sustained  Attention Appears intact    Memory Impaired    Memory Impairment --   repeats self, per pt   Awareness Impaired    Awareness Impairment --   reduced awareness of voice changes     Auditory Comprehension   Overall Auditory Comprehension Appears within functional limits for tasks assessed      Visual Recognition/Discrimination   Discrimination Not tested      Reading Comprehension   Reading Status Within funtional limits      Expression   Primary Mode of Expression Verbal      Verbal Expression   Overall Verbal Expression Appears within functional limits for tasks assessed      Written Expression   Dominant Hand Right    Written Expression Not tested      Oral Motor/Sensory Function   Overall Oral Motor/Sensory Function Appears within functional limits for tasks assessed      Motor Speech   Overall Motor Speech Impaired    Respiration Impaired    Level of Impairment Sentence    Phonation Hoarse;Other (comment)   gravelly   Resonance Within functional limits    Articulation Impaired    Level of Impairment Conversation   mumbles when she is tired   Development worker, 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    Effective Techniques Slow rate;Increased vocal intensity    Phonation Impaired    Volume --   appropriate in quiet environment, spouse reports less intelligible in noisy environments   Pitch Low      Standardized Assessments   Standardized Assessments  Other Assessment           LSVT-LOUD Voice Evaluation Maximum phonation time for sustained "ah": 8.6 seconds Mean intensity during sustained "ah": 76 dB  Mean fundamental frequency during sustained "ah": 170 Hz (2.7 STD below average of 244 Hz +/-27 for age and gender) Mean intensity sustained during conversational speech: 79 dB Habitual pitch: 223 Hz Highest dynamic pitch when altering pitch from a low note to a high note: 309 Hz Lowest dynamic pitch when altering from a high note to a low note:  193 Hz Highest dynamic pitch during conversational speech: 327 Hz Lowest dynamic pitch during conversational speech: 90 Hz  Speech is characterized by moderate hoarseness and raspy vocal quality and mildly reduced vocal intensity. Intelligibility in a quiet environment for this trained listener was not impacted.   Patient able to improve all parameters with model ("Loud like me") Stimulability: Improved vocal quality significantly with loud voice (83 dB avg) for sustained vowel, maximum high and low phonation improved to range 126-331Hz , and functional phrases averaged 85 dB.  Improved pitch range with model.     The Communication Effectiveness Survey  is a patient-reported outcome measure in which the patient rates their own effectiveness in different communication situations. A higher score indicates greater effectiveness. Pt's self-rating was 21/32. Patient reported difficulty communicating in noisier environments and over the phone.         SLP Education - 06/10/20 1701    Education Details Louder speech will improve vocal quality    Person(s) Educated Patient;Spouse    Methods Explanation;Demonstration;Verbal cues    Comprehension Verbalized understanding;Returned demonstration;Verbal cues required;Need further instruction            SLP Short Term Goals - 06/10/20 1750      SLP SHORT TERM GOAL #1   Title The patient will complete Daily Tasks (Maximum duration "ah", High/Lows, and Functional Phrases) at average loudness >/= 85 dB and with loud, good quality voice with min cues.    Time 10   sessions   Status New      SLP SHORT TERM GOAL #2   Title The patient will complete Hierarchal Speech Loudness reading drills (words/phrases, sentences) at average >/= 75 dB and with loud, good quality voice with min cues.    Time 10   sessions   Period Weeks    Status New      SLP SHORT TERM GOAL #3   Title The patient will participate in 5-8 minutes conversation, maintaining average  loudness of 75 dB and loud, good quality voice with min cues.    Time 10   sessions   Status New            SLP Long Term Goals - 06/11/20 1408      SLP LONG TERM GOAL #1   Title The patient will complete Daily Tasks (Maximum duration "ah", High/Lows, and Functional Phrases) at average loudness >/= 85 dB and with loud, good quality voice.    Time 4   or 17 sessions, for all LTGs   Period Weeks    Status New    Target Date 07/19/20      SLP LONG TERM GOAL #2   Title The patient will complete Hierarchal Speech Loudness reading drills (words/phrases, sentences, and paragraph) at average >/= 75 dB and with loud, good quality voice.    Time 4    Period Weeks    Status New    Target Date 07/19/20      SLP LONG TERM GOAL #3   Title The patient will participate in 15-20 minutes conversation, maintaining average loudness of >/= 75 dB and loud, good quality voice.    Time 4    Period Weeks    Status New    Target Date 07/19/20      SLP LONG TERM GOAL #4   Title Patient will report improved communication effectiveness as measured by Communicative Effectiveness Survey.    Baseline 21/32 on 06/10/2020, higher score = "More effective"    Time 4    Period Weeks    Status New    Target Date 07/19/20      SLP LONG TERM GOAL #5   Title Patient will participate in Modified Barium Swallow study for assessment of swallow function if clinically indicated.    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 She also reports intermittent dysphagia  with liquids; to be assessed clinically and may consider objective swallowing evaluation if indicated. Pt's dysarthria manifests primarily as hoarse, raspy vocal quality with reduced pitch variation. Loudness in a quiet environment is [facility], however pt reports difficulty communicating  in noiser spaces and on the phone. Intensity-based cues were highly effective for stimulating improved vocal quality as well as pitch range. I recommend skilled ST for LSVT-LOUD treatment 4 days a week x4 weeks to improve vocal quality and ability to communicate with family and friends in the community.    Speech Therapy Frequency 4x / week    Duration 4 weeks    Treatment/Interventions Aspiration precaution training;SLP instruction and feedback;Functional tasks;Patient/family education;Cueing hierarchy;Other (comment)   LSVT-LOUD   Potential to Achieve Goals Good    SLP Home Exercise Plan to be provided next session    Consulted and Agree with Plan of Care Patient;Family member/caregiver           Patient will benefit from skilled therapeutic intervention in order to improve the following deficits and impairments:   Dysarthria and anarthria - Plan: SLP plan of care cert/re-cert  Dysphagia, oropharyngeal phase - Plan: SLP plan of care cert/re-cert  Parkinson's disease ([place]) - Plan: SLP plan of care cert/re-cert    Problem List Patient Active Problem List   Diagnosis Date Noted  . Chronic anemia 08/09/2018  . External hemorrhoids   . Hypertrophy of anal papillae   . Loss of weight   . Diverticulosis of large intestine without diverticulitis   . Dehydration 07/15/2018  . Parkinson's disease ([place]) 04/18/2018  . Hyperlipidemia, mixed 10/27/2016  . Benign essential hypertension 06/11/2016  . Malignant neoplasm of central portion of left female breast ([place]) 04/16/2015  . Primary cancer of lower-inner quadrant of left female breast ([place]) 12/04/2014  . Acquired spondylolisthesis 10/11/2013  . B12 deficiency 10/02/2013  . Lumbar disc disease 09/07/2013   Deneise Lever, [place], [place] E Jesyka Slaght 06/11/2020, 2:34 PM  [place] MAIN [facility] SERVICES [address] [place], [place], [address] Phone: [phone]   Fax:   [phone]  Name: Kathy Bean MRN: [id number] Date of Birth: May 27, 1950

## 2020-06-14 NOTE — Therapy (Signed)
[place] MAIN [facility] SERVICES [address] [place], [place], [address] Phone: [phone]   Fax:  [phone]  Occupational Therapy Evaluation  Patient Details  Name: Kathy Bean MRN: [id number] Date of Birth: 1950-02-23 No data recorded  Encounter Date: 06/10/2020   OT End of Session - 06/14/20 1406    Visit Number 1    Number of Visits 17    Date for OT Re-Evaluation 07/19/20    OT Start Time 1300    OT Stop Time 1400    OT Time Calculation (min) 60 min    Activity Tolerance Patient tolerated treatment well    Behavior During Therapy [facility] for tasks assessed/performed           Past Medical History:  Diagnosis Date  . Breast cancer ([place]) 11/2014   left  . Cancer [facility]) 2016   left breast  . Depression   . Dysrhythmia    heart racing  . Family history of adverse reaction to anesthesia    brother had difficulty going under anesthseia  . Headache    hx of migraines   period induced  . Hypertension   . Osteoarthritis   . Parkinson disease ([place])   . Personal history of radiation therapy     Past Surgical History:  Procedure Laterality Date  . BREAST BIOPSY Left 10/2014   positive  . BREAST LUMPECTOMY Left 2016   invasive mammary  . COLONOSCOPY Left 07/17/2018   Procedure: COLONOSCOPY;  Surgeon: Virgel Manifold, MD;  Location: [facility] ENDOSCOPY;  Service: Endoscopy;  Laterality: Left;  . DILATION AND CURETTAGE OF UTERUS    . ESOPHAGOGASTRODUODENOSCOPY Left 07/17/2018   Procedure: ESOPHAGOGASTRODUODENOSCOPY (EGD);  Surgeon: Virgel Manifold, MD;  Location: [facility] ENDOSCOPY;  Service: Endoscopy;  Laterality: Left;  . Laminectomy Posterior Cervicle Decomp W/Facetectomy & Foraminotomy     [id number] x 3...Marland KitchenMarland KitchenEXCISION CENTRAL LEFT [place] HERNIATION L 2-3; Surgeon: Loni Dolly, MD; Location: [place]; Service: Orthopedics; Laterality: Left  . Laminectomy Posterior Lumbar Facetectomy & Foraminotomy W/Decomp     BILATERAL LUMBAR  DECOMPRESSION L2-3, L3-4, L4-5; Surgeon: Loni Dolly, MD; Location: [place]; Service: Orthopedics; Laterality: Bilateral;  . PARTIAL MASTECTOMY WITH NEEDLE LOCALIZATION Left 11/29/2014   Procedure: PARTIAL MASTECTOMY WITH NEEDLE LOCALIZATION;  Surgeon: Leonie Green, MD;  Location: [facility] ORS;  Service: General;  Laterality: Left;  . SENTINEL NODE BIOPSY Left 11/29/2014   Procedure: SENTINEL NODE BIOPSY;  Surgeon: Leonie Green, MD;  Location: [facility] ORS;  Service: General;  Laterality: Left;    There were no vitals filed for this visit.   Subjective Assessment - 06/14/20 1350    Subjective  Pt reports she has some pain and movement in her feet.    Pertinent History Pt was diagnosed with Parkinson's since July 2015.   Pt reports decline in strength in legs, walking, feels "wobble".  Reports feeling fuzzy at times, has some tremors right side.    Patient Stated Goals Pt would like to be as independent as possible, be more stable.    Currently in Pain? Yes    Pain Score 3     Pain Location Shoulder    Pain Orientation Right    Pain Descriptors / Indicators Burning;Tingling    Pain Type Chronic pain    Pain Onset More than a month ago    Pain Frequency Constant             [facility] OT Assessment - 06/14/20 1351  Assessment   Medical Diagnosis Parkinson's disease    Hand Dominance Right      Balance Screen   Has the patient fallen in the past 6 months No      Home  Environment   Family/patient expects to be discharged to: Private residence    Living Arrangements Spouse/significant other    Available Help at Discharge Family    Type of Ware Two level    Alternate Level Stairs - Number of Steps has chair lift    Bathroom Shower/Tub Tub only;Curtain    [place] - 4 wheels;Shower seat;Hand held shower head;Toilet riser    Lives With Spouse;Daughter      Prior Function    Level of Independence Needs assistance with ADLs    Vocation Retired    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    Leisure likes to research things on the computer      ADL   Eating/Feeding Modified independent    Grooming Modified independent    Upper Body Bathing Minimal assistance    Lower Body Bathing Increased time    Upper Body Dressing Increased time;Needs assist for fasteners    Lower Body Dressing Minimal assistance    Toilet Transfer Minimal assistance    Toileting - Clothing Manipulation Use of adaptive device;Increased time    Tub/Shower Transfer Moderate assistance    ADL comments Pt reports she has difficulty with curling her hair, assist for fasteners, socks, occasional difficulty with toilet transfers, difficulty with long standing, getting in and out of the car, unable to perform meal prep.  She has difficulty scooting forward in chair and unable to perform sit to stand without the use of arm rests.      IADL   Prior Level of Function Shopping independent    Shopping Completely unable to shop    Prior Level of Function Light Housekeeping independent    Light Housekeeping Needs help with all home maintenance tasks;Does not participate in any housekeeping tasks    Prior Level of Function Meal Prep independent    Meal Prep Needs to have meals prepared and served    Prior Level of Function Scientist, research (physical sciences) Relies on family or friends for transportation    Prior Level of Function Medication Managment independent    Medication Management Has difficulty remembering to take medication;Is not capable of dispensing or managing own medication    Prior Level of Function Financial Management independent    Financial Management Dependent      Mobility   Mobility Status Needs assist;History of falls      Written Expression   Dominant Hand Right      Vision - History   Baseline Vision Wears glasses all the time       Cognition   Overall Cognitive Status Impaired/Different from baseline    Executive Function Sequencing      Observation/Other Assessments   Focus on Therapeutic Outcomes (FOTO)  36      Sensation   Light Touch Appears Intact      Coordination   9 Hole Peg Test Right;Left    Right 9 Hole Peg Test 28    Left 9 Hole Peg Test 30      AROM   Overall AROM Comments Pt has limited shoulder flexion bilaterally to 90 degrees, right shoulder she reports issues  with rotator cuff in the past.      Strength   Overall Strength Comments Limited strength overall 2/5 UE      Hand Function   Right Hand Grip (lbs) 30    Right Hand Lateral Pinch 13 lbs    Right Hand 3 Point Pinch 12 lbs    Left Hand Grip (lbs) 29    Left Hand Lateral Pinch 11 lbs    Left 3 point pinch 10 lbs           6 min walk test 425 feet 5 times sit to stand unable to complete Unable to stand without the use of arm rests                      OT Long Term Goals - 06/15/20 1418      OT LONG TERM GOAL #1   Title Patient will improve gait speed and endurance to be able to ambulate 600 feet in 6 minutes to negotiate around the home and community safely in 4 weeks.    Baseline 425 feet at eval    Time 4    Period Weeks    Status New    Target Date 07/19/20      OT LONG TERM GOAL #2   Title Patient will complete HEP for maximal daily exercises with modified independence in 4 weeks.    Baseline no current program    Time 4    Period Weeks    Status New    Target Date 07/19/20      OT LONG TERM GOAL #3   Title Patient will transfer from to sit to stand without the use of arms safely and independently from a variety of chairs/surfaces in 4 weeks.    Baseline unable at evaluation    Time 4    Period Weeks    Status New    Target Date 07/19/20      OT LONG TERM GOAL #4   Title Pt will complete toilet transfers with modified independence    Baseline occasional assist from husband    Time 4     Period Weeks    Status New    Target Date 07/19/20      OT LONG TERM GOAL #5   Title Pt will improve ROM in UE to complete hair care with occasional min assist    Baseline moderate to max assist at eval    Time 4    Period Weeks    Status New    Target Date 07/19/20      Long Term Additional Goals   Additional Long Term Goals Yes      OT LONG TERM GOAL #6   Title Patient will don and doff socks with modified independence    Baseline difficulty at eval    Time 4    Period Weeks    Target Date 07/19/20                 Plan - 06/14/20 1407    Clinical Impression Statement Patient is a 70 yo female diagnosed with Parkinson's disease and referred by her physician for LSVT BIG program.  Patient presents with decreased step length with gait patterns, decreased, decreased balance, decreased coordination, decreased muscle strength, decreased cognition/memory, which affect her ability to perform daily tasks.  The patient is judged to be an excellent candidate for the LSVT BIG program.  She would benefit from and was referred for the  LSVT BIG program which is an intensive program designed specifically for Parkinson's patients with a focus on increasing amplitude and speed of movements, improving self-care and daily tasks and providing patients with daily exercises to improve overall function.  It is recommended that the patient receive the LSVT BIG program, which is comprised of 16 intensive sessions, (4 times a week for 4 weeks, one hour sessions).  Prognosis for improvement is good based on patient's motivation and family support.  LSVT BIG has been documented in the literature as efficacious for individuals with Parkinson's disease.    OT Occupational Profile and History Detailed Assessment- Review of Records and additional review of physical, cognitive, psychosocial history related to current functional performance    Occupational performance deficits (Please refer to evaluation for  details): ADL's;IADL's;Leisure    Body Structure / Function / Physical Skills ADL;Dexterity;Flexibility;ROM;Strength;Balance;Coordination;FMC;IADL;Body mechanics;Endurance;Gait;Pain;UE functional use;GMC;Mobility    Geophysicist/field seismologist;Sequencing    Psychosocial Skills Environmental  Adaptations;Habits;Routines and Behaviors    Rehab Potential Good    Clinical Decision Making Several treatment options, min-mod task modification necessary    Comorbidities Affecting Occupational Performance: May have comorbidities impacting occupational performance    Modification or Assistance to Complete Evaluation  No modification of tasks or assist necessary to complete eval    OT Frequency 4x / week    OT Duration 4 weeks    OT Treatment/Interventions Self-care/ADL training;Cryotherapy;Therapeutic exercise;DME and/or AE instruction;Functional Mobility Training;Balance training;Neuromuscular education;Gait Training;Manual Therapy;Moist Heat;Contrast Bath;Passive range of motion;Therapeutic activities;Patient/family education    Consulted and Agree with Plan of Care Patient;Family member/caregiver    Family Member Consulted husband           Patient will benefit from skilled therapeutic intervention in order to improve the following deficits and impairments:   Body Structure / Function / Physical Skills: ADL,Dexterity,Flexibility,ROM,Strength,Balance,Coordination,FMC,IADL,Body mechanics,Endurance,Gait,Pain,UE functional use,GMC,Mobility Cognitive Skills: Attention,Memory,Sequencing Psychosocial Skills: Environmental  Adaptations,Habits,Routines and Behaviors   Visit Diagnosis: Muscle weakness (generalized)  Other lack of coordination  Unsteadiness on feet  Difficulty in walking, not elsewhere classified  Chronic right shoulder pain    Problem List Patient Active Problem List   Diagnosis Date Noted  . Chronic anemia 08/09/2018  . External hemorrhoids   . Hypertrophy of anal  papillae   . Loss of weight   . Diverticulosis of large intestine without diverticulitis   . Dehydration 07/15/2018  . Parkinson's disease ([place]) 04/18/2018  . Hyperlipidemia, mixed 10/27/2016  . Benign essential hypertension 06/11/2016  . Malignant neoplasm of central portion of left female breast ([place]) 04/16/2015  . Primary cancer of lower-inner quadrant of left female breast ([place]) 12/04/2014  . Acquired spondylolisthesis 10/11/2013  . B12 deficiency 10/02/2013  . Lumbar disc disease 09/07/2013   Achilles Dunk, OTR/L, CLT  Alaina Donati 06/15/2020, 2:22 PM  [place] MAIN [facility] SERVICES [address] [place], [place], [address] Phone: [phone]   Fax:  [phone]  Name: Kathy Bean MRN: [id number] Date of Birth: 1950-05-03

## 2020-06-17 ENCOUNTER — Other Ambulatory Visit: Payer: Self-pay

## 2020-06-17 ENCOUNTER — Ambulatory Visit: Payer: Medicare Other | Admitting: Occupational Therapy

## 2020-06-17 ENCOUNTER — Ambulatory Visit: Payer: Medicare Other | Admitting: Speech Pathology

## 2020-06-17 DIAGNOSIS — R278 Other lack of coordination: Secondary | ICD-10-CM

## 2020-06-17 DIAGNOSIS — G8929 Other chronic pain: Secondary | ICD-10-CM

## 2020-06-17 DIAGNOSIS — M25511 Pain in right shoulder: Secondary | ICD-10-CM

## 2020-06-17 DIAGNOSIS — G20 Parkinson's disease: Secondary | ICD-10-CM

## 2020-06-17 DIAGNOSIS — M6281 Muscle weakness (generalized): Secondary | ICD-10-CM | POA: Diagnosis not present

## 2020-06-17 DIAGNOSIS — R262 Difficulty in walking, not elsewhere classified: Secondary | ICD-10-CM

## 2020-06-17 DIAGNOSIS — R2681 Unsteadiness on feet: Secondary | ICD-10-CM

## 2020-06-18 ENCOUNTER — Ambulatory Visit: Payer: Medicare Other | Admitting: Occupational Therapy

## 2020-06-18 ENCOUNTER — Ambulatory Visit: Payer: Medicare Other | Admitting: Speech Pathology

## 2020-06-18 DIAGNOSIS — M6281 Muscle weakness (generalized): Secondary | ICD-10-CM | POA: Diagnosis not present

## 2020-06-18 DIAGNOSIS — G20A1 Parkinson's disease without dyskinesia, without mention of fluctuations: Secondary | ICD-10-CM

## 2020-06-18 DIAGNOSIS — G20 Parkinson's disease: Secondary | ICD-10-CM

## 2020-06-18 DIAGNOSIS — R2681 Unsteadiness on feet: Secondary | ICD-10-CM

## 2020-06-18 DIAGNOSIS — R262 Difficulty in walking, not elsewhere classified: Secondary | ICD-10-CM

## 2020-06-18 DIAGNOSIS — R278 Other lack of coordination: Secondary | ICD-10-CM

## 2020-06-19 ENCOUNTER — Encounter: Payer: Self-pay | Admitting: Occupational Therapy

## 2020-06-19 ENCOUNTER — Ambulatory Visit: Payer: Medicare Other | Admitting: Occupational Therapy

## 2020-06-19 ENCOUNTER — Other Ambulatory Visit: Payer: Self-pay

## 2020-06-19 ENCOUNTER — Ambulatory Visit: Payer: Medicare Other | Admitting: Speech Pathology

## 2020-06-19 DIAGNOSIS — M6281 Muscle weakness (generalized): Secondary | ICD-10-CM

## 2020-06-19 DIAGNOSIS — G20 Parkinson's disease: Secondary | ICD-10-CM

## 2020-06-19 DIAGNOSIS — R278 Other lack of coordination: Secondary | ICD-10-CM

## 2020-06-19 NOTE — Therapy (Signed)
[place] MAIN [facility] SERVICES [address] [place], [place], [address] Phone: [phone]   Fax:  [phone]  Occupational Therapy Treatment  Patient Details  Name: Kathy Bean MRN: [id number] Date of Birth: [birth year] No data recorded  Encounter Date: 06/17/2020   OT End of Session - 06/18/20 1823    Visit Number 2    Number of Visits 17    Date for OT Re-Evaluation 07/19/20    OT Start Time 1259    OT Stop Time 1400    OT Time Calculation (min) 61 min    Activity Tolerance Patient tolerated treatment well    Behavior During Therapy [facility] for tasks assessed/performed           Past Medical History:  Diagnosis Date  . Breast cancer ([place]) 11/2014   left  . Cancer [facility]) 2016   left breast  . Depression   . Dysrhythmia    heart racing  . Family history of adverse reaction to anesthesia    brother had difficulty going under anesthseia  . Headache    hx of migraines   period induced  . Hypertension   . Osteoarthritis   . Parkinson disease ([place])   . Personal history of radiation therapy     Past Surgical History:  Procedure Laterality Date  . BREAST BIOPSY Left 10/2014   positive  . BREAST LUMPECTOMY Left 2016   invasive mammary  . COLONOSCOPY Left 07/17/2018   Procedure: COLONOSCOPY;  Surgeon: Virgel Manifold, MD;  Location: [facility] ENDOSCOPY;  Service: Endoscopy;  Laterality: Left;  . DILATION AND CURETTAGE OF UTERUS    . ESOPHAGOGASTRODUODENOSCOPY Left 07/17/2018   Procedure: ESOPHAGOGASTRODUODENOSCOPY (EGD);  Surgeon: Virgel Manifold, MD;  Location: [facility] ENDOSCOPY;  Service: Endoscopy;  Laterality: Left;  . Laminectomy Posterior Cervicle Decomp W/Facetectomy & Foraminotomy     [id number] x 3...Marland KitchenMarland KitchenEXCISION CENTRAL LEFT [place] HERNIATION L 2-3; Surgeon: Loni Dolly, MD; Location: [place]; Service: Orthopedics; Laterality: Left  . Laminectomy Posterior Lumbar Facetectomy & Foraminotomy W/Decomp     BILATERAL LUMBAR  DECOMPRESSION L2-3, L3-4, L4-5; Surgeon: Loni Dolly, MD; Location: [place]; Service: Orthopedics; Laterality: Bilateral;  . PARTIAL MASTECTOMY WITH NEEDLE LOCALIZATION Left 11/29/2014   Procedure: PARTIAL MASTECTOMY WITH NEEDLE LOCALIZATION;  Surgeon: Leonie Green, MD;  Location: [facility] ORS;  Service: General;  Laterality: Left;  . SENTINEL NODE BIOPSY Left 11/29/2014   Procedure: SENTINEL NODE BIOPSY;  Surgeon: Leonie Green, MD;  Location: [facility] ORS;  Service: General;  Laterality: Left;    There were no vitals filed for this visit.   Subjective Assessment - 06/19/20 1820    Subjective  Patient reports she is ready to get started, husband present during session for instruction on maximal daily exercises.    Pertinent History Pt was diagnosed with Parkinson's since July 2015.   Pt reports decline in strength in legs, walking, feels "wobble".  Reports feeling fuzzy at times, has some tremors right side.    Patient Stated Goals Pt would like to be as independent as possible, be more stable.    Currently in Pain? Yes    Pain Score 2     Pain Location Back    Pain Orientation Lower    Pain Descriptors / Indicators Aching    Pain Type Chronic pain    Pain Onset More than a month ago    Pain Frequency Intermittent            Patient  seen for initial instruction of LSVT BIG exercises: LSVT Daily Session Maximal Daily Exercises: Sustained movements are designed to rescale the amplitude of movement output for generalization to daily functional activities. Performed as follows for 1 set of 10 repetitions each: Multi directional sustained movements- 1) Floor to ceiling, 2) Side to side. Multi directional Repetitive movements performed in standing and are designed to provide retraining effort needed for sustained muscle activation in tasks Performed as follows: 3) Step and reach forward, 4) Step and Reach Backwards, 5) Step and reach sideways, 6) Rock and reach forward/backward, 7)  Rock and reach sideways. Pt required min assist for balance with exercises in standing, tactile cues for arm movements.  Exercises were performed in an ADAPTED version secondary to decreased balance.   Sit to stand from mat table on lowest setting with cues for weight shift, technique and min assist for 10 reps for 1 set.     Functional mobility with use of rollator for 2 trials of 175 feet, min guard  Response to tx: Patient performed well with initial instruction of LSVT BIG maximal daily exercises, required min assist for balance during exercises.  Difficulty with sit to stand from mat table, increased surface height by 2 inches to grade activity for performance.  Patient responds to cues for amplitude of movement but as she fatigues, she resorts to smaller step patterns.  Husband present during session for instruction on home program.  Will plan to issue handout next session for home exercises.  Continue OT to improve balance, ROM, strength and functional mobility for daily exercises.                 OT Education - 06/19/20 1822    Education Details maximal daily exercises, functional mobility, amplitude of steps    Person(s) Educated Patient;Spouse    Methods Explanation;Demonstration;Verbal cues    Comprehension Verbalized understanding;Returned demonstration               OT Long Term Goals - 06/15/20 1418      OT LONG TERM GOAL #1   Title Patient will improve gait speed and endurance to be able to ambulate 600 feet in 6 minutes to negotiate around the home and community safely in 4 weeks.    Baseline 425 feet at eval    Time 4    Period Weeks    Status New    Target Date 07/19/20      OT LONG TERM GOAL #2   Title Patient will complete HEP for maximal daily exercises with modified independence in 4 weeks.    Baseline no current program    Time 4    Period Weeks    Status New    Target Date 07/19/20      OT LONG TERM GOAL #3   Title Patient will transfer  from to sit to stand without the use of arms safely and independently from a variety of chairs/surfaces in 4 weeks.    Baseline unable at evaluation    Time 4    Period Weeks    Status New    Target Date 07/19/20      OT LONG TERM GOAL #4   Title Pt will complete toilet transfers with modified independence    Baseline occasional assist from husband    Time 4    Period Weeks    Status New    Target Date 07/19/20      OT LONG TERM GOAL #5   Title Pt  will improve ROM in UE to complete hair care with occasional min assist    Baseline moderate to max assist at eval    Time 4    Period Weeks    Status New    Target Date 07/19/20      Long Term Additional Goals   Additional Long Term Goals Yes      OT LONG TERM GOAL #6   Title Patient will don and doff socks with modified independence    Baseline difficulty at eval    Time 4    Period Weeks    Target Date 07/19/20                 Plan - 06/19/20 1855    Clinical Impression Statement Patient performed well with initial instruction of LSVT BIG maximal daily exercises, required min assist for balance during exercises.  Difficulty with sit to stand from mat table, increased surface height by 2 inches to grade activity for performance.  Patient responds to cues for amplitude of movement but as she fatigues, she resorts to smaller step patterns.  Husband present during session for instruction on home program.  Will plan to issue handout next session for home exercises.  Continue OT to improve balance, ROM, strength and functional mobility for daily exercises.    OT Occupational Profile and History Detailed Assessment- Review of Records and additional review of physical, cognitive, psychosocial history related to current functional performance    Occupational performance deficits (Please refer to evaluation for details): ADL's;IADL's;Leisure    Body Structure / Function / Physical Skills  ADL;Dexterity;Flexibility;ROM;Strength;Balance;Coordination;FMC;IADL;Body mechanics;Endurance;Gait;Pain;UE functional use;GMC;Mobility    Geophysicist/field seismologist;Sequencing    Psychosocial Skills Environmental  Adaptations;Habits;Routines and Behaviors    Rehab Potential Good    Clinical Decision Making Several treatment options, min-mod task modification necessary    Comorbidities Affecting Occupational Performance: May have comorbidities impacting occupational performance    Modification or Assistance to Complete Evaluation  No modification of tasks or assist necessary to complete eval    OT Frequency 4x / week    OT Duration 4 weeks    OT Treatment/Interventions Self-care/ADL training;Cryotherapy;Therapeutic exercise;DME and/or AE instruction;Functional Mobility Training;Balance training;Neuromuscular education;Gait Training;Manual Therapy;Moist Heat;Contrast Bath;Passive range of motion;Therapeutic activities;Patient/family education    Consulted and Agree with Plan of Care Patient;Family member/caregiver    Family Member Consulted husband           Patient will benefit from skilled therapeutic intervention in order to improve the following deficits and impairments:   Body Structure / Function / Physical Skills: ADL,Dexterity,Flexibility,ROM,Strength,Balance,Coordination,FMC,IADL,Body mechanics,Endurance,Gait,Pain,UE functional use,GMC,Mobility Cognitive Skills: Attention,Memory,Sequencing Psychosocial Skills: Environmental  Adaptations,Habits,Routines and Behaviors   Visit Diagnosis: Muscle weakness (generalized)  Other lack of coordination  Unsteadiness on feet  Difficulty in walking, not elsewhere classified  Parkinson's disease ([place])  Chronic right shoulder pain    Problem List Patient Active Problem List   Diagnosis Date Noted  . Chronic anemia 08/09/2018  . External hemorrhoids   . Hypertrophy of anal papillae   . Loss of weight   . Diverticulosis of  large intestine without diverticulitis   . Dehydration 07/15/2018  . Parkinson's disease ([place]) 04/18/2018  . Hyperlipidemia, mixed 10/27/2016  . Benign essential hypertension 06/11/2016  . 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 Primary cancer of lower-inner quadrant of left female breast ([place]) 12/04/2014  . Acquired spondylolisthesis 10/11/2013  . B12 deficiency 10/02/2013  . Lumbar disc disease 09/07/2013   Tallyn Holroyd Oneita Jolly,  OTR/L, CLT  Jarid Sasso 06/19/2020, 6:55 PM  [place] MAIN [facility] SERVICES [address] [place], [place], [address] Phone: [phone]   Fax:  [phone]  Name: BRAYLON LEMMONS MRN: [id number] Date of Birth: [birth year]

## 2020-06-19 NOTE — Therapy (Signed)
[place] MAIN [facility] SERVICES [address] [place], [place], [address] Phone: [phone]   Fax:  [phone]  Occupational Therapy Treatment  Patient Details  Name: Kathy Bean MRN: [id number] Date of Birth: 10/20/1950 No data recorded  Encounter Date: 06/18/2020   OT End of Session - 06/19/20 1957    Visit Number 3    Number of Visits 17    Date for OT Re-Evaluation 07/19/20    OT Start Time 1300    OT Stop Time 1400    OT Time Calculation (min) 60 min    Activity Tolerance Patient tolerated treatment well    Behavior During Therapy [facility] for tasks assessed/performed           Past Medical History:  Diagnosis Date  . Breast cancer ([place]) 11/2014   left  . Cancer [facility]) 2016   left breast  . Depression   . Dysrhythmia    heart racing  . Family history of adverse reaction to anesthesia    brother had difficulty going under anesthseia  . Headache    hx of migraines   period induced  . Hypertension   . Osteoarthritis   . Parkinson disease ([place])   . Personal history of radiation therapy     Past Surgical History:  Procedure Laterality Date  . BREAST BIOPSY Left 10/2014   positive  . BREAST LUMPECTOMY Left 2016   invasive mammary  . COLONOSCOPY Left 07/17/2018   Procedure: COLONOSCOPY;  Surgeon: Virgel Manifold, MD;  Location: [facility] ENDOSCOPY;  Service: Endoscopy;  Laterality: Left;  . DILATION AND CURETTAGE OF UTERUS    . ESOPHAGOGASTRODUODENOSCOPY Left 07/17/2018   Procedure: ESOPHAGOGASTRODUODENOSCOPY (EGD);  Surgeon: Virgel Manifold, MD;  Location: [facility] ENDOSCOPY;  Service: Endoscopy;  Laterality: Left;  . Laminectomy Posterior Cervicle Decomp W/Facetectomy & Foraminotomy     [id number] x 3...Marland KitchenMarland KitchenEXCISION CENTRAL LEFT [place] HERNIATION L 2-3; Surgeon: Loni Dolly, MD; Location: [place]; Service: Orthopedics; Laterality: Left  . Laminectomy Posterior Lumbar Facetectomy & Foraminotomy W/Decomp     BILATERAL LUMBAR  DECOMPRESSION L2-3, L3-4, L4-5; Surgeon: Loni Dolly, MD; Location: [place]; Service: Orthopedics; Laterality: Bilateral;  . PARTIAL MASTECTOMY WITH NEEDLE LOCALIZATION Left 11/29/2014   Procedure: PARTIAL MASTECTOMY WITH NEEDLE LOCALIZATION;  Surgeon: Leonie Green, MD;  Location: [facility] ORS;  Service: General;  Laterality: Left;  . SENTINEL NODE BIOPSY Left 11/29/2014   Procedure: SENTINEL NODE BIOPSY;  Surgeon: Leonie Green, MD;  Location: [facility] ORS;  Service: General;  Laterality: Left;    There were no vitals filed for this visit.   Subjective Assessment - 06/19/20 1926    Subjective  Pt reports her back is still hurting a bit.  No other pain noted, can tell she has been using her arms with the exercises.    Pertinent History Pt was diagnosed with Parkinson's since July 2015.   Pt reports decline in strength in legs, walking, feels "wobble".  Reports feeling fuzzy at times, has some tremors right side.    Patient Stated Goals Pt would like to be as independent as possible, be more stable.    Currently in Pain? Yes    Pain Score 2     Pain Location Back    Pain Orientation Lower    Pain Descriptors / Indicators Aching    Pain Type Chronic pain    Pain Onset More than a month ago    Pain Frequency Intermittent    Multiple Pain  Sites No          Maximal Daily Exercises performed in ADAPTED VERSION: Patient seen for initial instruction of LSVT BIG exercises: LSVT Daily Session Maximal Daily Exercises: Sustained movements are designed to rescale the amplitude of movement output for generalization to daily functional activities. Performed as follows for 1 set of 10 repetitions each: Multi directional sustained movements- 1) Floor to ceiling, 2) Side to side. Multi directional Repetitive movements performed in standing and are designed to provide retraining effort needed for sustained muscle activation in tasks Performed as follows: 3) Step and reach forward, 4) Step and  Reach Backwards, 5) Step and reach sideways, 6) Rock and reach forward/backward, 7) Rock and reach sideways.  Pt required min assist for balance with exercises in standing, tactile cues for arm movements.  Exercises were performed in an ADAPTED version secondary to decreased balance.  Reaching to behind the head with each arm, therapist mirroring and cues.   Sit to stand from mat table on lowest setting with cues for weight shift, technique and min assist for 10 reps for 1 set.    Functional mobility with use of rollator for 1 trial of 200 feet and then 1 trial of 150 feet, min guard  7  hand exercises to help promote handwriting, dexterity and flexibility. 1)  Fisting with arm then arm extension with fingers extended with BIG hand movements 2) oppositional movements of the thumb to each digit 3) wrist flex/ext with elbows extended 4) supination/pronation 5) tendon gliding with hook fist and then finger extension 6) tendon gliding with tabletop movement of fingers with MP flexion, PIP and DIP extension 7) MP flexion, PIP flexion, DIP extension Performed with therapist demo and occasional cues for form  Response to tx: Patient able to recall portions of exercises from last session, cues and therapist demo provided.  Patient requires min assist for performing exercises in standing, decreased amplitude of stepping patterns and requires increased cues to improve.  Use of chair for adapted version, issued and instruction on written/pictoral exercises for LSVT BIG HEP, patient will perform 2 times a day, once in therapy and once at home.  All exercises are 10 reps each, patient demonstrates understanding. Husband present during session for instruction on exercises and handouts and demonstrates understanding and will plan to help patient daily.  Continue to work towards goals in plan of care, will continue with daily exercise instruction and work towards improving calibration of movement patterns.                            OT Long Term Goals - 06/15/20 1418      OT LONG TERM GOAL #1   Title Patient will improve gait speed and endurance to be able to ambulate 600 feet in 6 minutes to negotiate around the home and community safely in 4 weeks.    Baseline 425 feet at eval    Time 4    Period Weeks    Status New    Target Date 07/19/20      OT LONG TERM GOAL #2   Title Patient will complete HEP for maximal daily exercises with modified independence in 4 weeks.    Baseline no current program    Time 4    Period Weeks    Status New    Target Date 07/19/20      OT LONG TERM GOAL #3   Title Patient will transfer from to  sit to stand without the use of arms safely and independently from a variety of chairs/surfaces in 4 weeks.    Baseline unable at evaluation    Time 4    Period Weeks    Status New    Target Date 07/19/20      OT LONG TERM GOAL #4   Title Pt will complete toilet transfers with modified independence    Baseline occasional assist from husband    Time 4    Period Weeks    Status New    Target Date 07/19/20      OT LONG TERM GOAL #5   Title Pt will improve ROM in UE to complete hair care with occasional min assist    Baseline moderate to max assist at eval    Time 4    Period Weeks    Status New    Target Date 07/19/20      Long Term Additional Goals   Additional Long Term Goals Yes      OT LONG TERM GOAL #6   Title Patient will don and doff socks with modified independence    Baseline difficulty at eval    Time 4    Period Weeks    Target Date 07/19/20                 Plan - 06/19/20 1954    Clinical Impression Statement Patient able to recall portions of exercises from last session, cues and therapist demo provided.  Patient requires min assist for performing exercises in standing, decreased amplitude of stepping patterns and requires increased cues to improve.  Use of chair for adapted version, issued and instruction  on written/pictoral exercises for LSVT BIG HEP, patient will perform 2 times a day, once in therapy and once at home.  All exercises are 10 reps each, patient demonstrates understanding. Husband present during session for instruction on exercises and handouts and demonstrates understanding and will plan to help patient daily.  Continue to work towards goals in plan of care, will continue with daily exercise instruction and work towards improving calibration of movement patterns.    OT Occupational Profile and History Detailed Assessment- Review of Records and additional review of physical, cognitive, psychosocial history related to current functional performance    Occupational performance deficits (Please refer to evaluation for details): ADL's;IADL's;Leisure    Body Structure / Function / Physical Skills ADL;Dexterity;Flexibility;ROM;Strength;Balance;Coordination;FMC;IADL;Body mechanics;Endurance;Gait;Pain;UE functional use;GMC;Mobility    Geophysicist/field seismologist;Sequencing    Psychosocial Skills Environmental  Adaptations;Habits;Routines and Behaviors    Rehab Potential Good    Clinical Decision Making Several treatment options, min-mod task modification necessary    Comorbidities Affecting Occupational Performance: May have comorbidities impacting occupational performance    Modification or Assistance to Complete Evaluation  No modification of tasks or assist necessary to complete eval    OT Frequency 4x / week    OT Duration 4 weeks    OT Treatment/Interventions Self-care/ADL training;Cryotherapy;Therapeutic exercise;DME and/or AE instruction;Functional Mobility Training;Balance training;Neuromuscular education;Gait Training;Manual Therapy;Moist Heat;Contrast Bath;Passive range of motion;Therapeutic activities;Patient/family education    Consulted and Agree with Plan of Care Patient;Family member/caregiver    Family Member Consulted husband           Patient will benefit from  skilled therapeutic intervention in order to improve the following deficits and impairments:   Body Structure / Function / Physical Skills: ADL,Dexterity,Flexibility,ROM,Strength,Balance,Coordination,FMC,IADL,Body mechanics,Endurance,Gait,Pain,UE functional use,GMC,Mobility Cognitive Skills: Attention,Memory,Sequencing Psychosocial Skills: Environmental  Adaptations,Habits,Routines and Behaviors   Visit Diagnosis: Muscle weakness (generalized)  Parkinson's disease ([place])  Other lack of coordination  Unsteadiness on feet  Difficulty in walking, not elsewhere classified    Problem List Patient Active Problem List   Diagnosis Date Noted  . Chronic anemia 08/09/2018  . External hemorrhoids   . Hypertrophy of anal papillae   . Loss of weight   . Diverticulosis of large intestine without diverticulitis   . Dehydration 07/15/2018  . Parkinson's disease ([place]) 04/18/2018  . Hyperlipidemia, mixed 10/27/2016  . Benign essential hypertension 06/11/2016  . Malignant neoplasm of central portion of left female breast ([place]) 04/16/2015  . Primary cancer of lower-inner quadrant of left female breast ([place]) 12/04/2014  . Acquired spondylolisthesis 10/11/2013  . B12 deficiency 10/02/2013  . Lumbar disc disease 09/07/2013   Achilles Dunk, OTR/L, CLT   Kateria Cutrona 06/19/2020, 7:57 PM  [place] MAIN [facility] SERVICES [address] [place], [place], [address] Phone: [phone]   Fax:  [phone]  Name: CYANNE DELMAR MRN: [id number] Date of Birth: 11/08/1950

## 2020-06-20 ENCOUNTER — Ambulatory Visit: Payer: Medicare Other | Admitting: Speech Pathology

## 2020-06-20 ENCOUNTER — Ambulatory Visit: Payer: Medicare Other | Admitting: Occupational Therapy

## 2020-06-20 DIAGNOSIS — G20 Parkinson's disease: Secondary | ICD-10-CM

## 2020-06-20 DIAGNOSIS — M6281 Muscle weakness (generalized): Secondary | ICD-10-CM

## 2020-06-20 DIAGNOSIS — R2681 Unsteadiness on feet: Secondary | ICD-10-CM

## 2020-06-20 DIAGNOSIS — R278 Other lack of coordination: Secondary | ICD-10-CM

## 2020-06-20 DIAGNOSIS — R262 Difficulty in walking, not elsewhere classified: Secondary | ICD-10-CM

## 2020-06-21 ENCOUNTER — Encounter: Payer: Self-pay | Admitting: Occupational Therapy

## 2020-06-21 NOTE — Therapy (Signed)
[place] MAIN [facility] SERVICES [address] [place], [place], [address] Phone: [phone]   Fax:  [phone]  Occupational Therapy Treatment  Patient Details  Name: Kathy Bean MRN: [id number] Date of Birth: [birth year] No data recorded  Encounter Date: 06/19/2020   OT End of Session - 06/21/20 2046    Visit Number 4    Number of Visits 17    Date for OT Re-Evaluation 07/19/20    OT Start Time 1300    OT Stop Time 1359    OT Time Calculation (min) 59 min    Activity Tolerance Patient tolerated treatment well    Behavior During Therapy [facility] for tasks assessed/performed           Past Medical History:  Diagnosis Date  . Breast cancer ([place]) 11/2014   left  . Cancer [facility]) 2016   left breast  . Depression   . Dysrhythmia    heart racing  . Family history of adverse reaction to anesthesia    brother had difficulty going under anesthseia  . Headache    hx of migraines   period induced  . Hypertension   . Osteoarthritis   . Parkinson disease ([place])   . Personal history of radiation therapy     Past Surgical History:  Procedure Laterality Date  . BREAST BIOPSY Left 10/2014   positive  . BREAST LUMPECTOMY Left 2016   invasive mammary  . COLONOSCOPY Left 07/17/2018   Procedure: COLONOSCOPY;  Surgeon: Virgel Manifold, MD;  Location: [facility] ENDOSCOPY;  Service: Endoscopy;  Laterality: Left;  . DILATION AND CURETTAGE OF UTERUS    . ESOPHAGOGASTRODUODENOSCOPY Left 07/17/2018   Procedure: ESOPHAGOGASTRODUODENOSCOPY (EGD);  Surgeon: Virgel Manifold, MD;  Location: [facility] ENDOSCOPY;  Service: Endoscopy;  Laterality: Left;  . Laminectomy Posterior Cervicle Decomp W/Facetectomy & Foraminotomy     [id number] x 3...Marland KitchenMarland KitchenEXCISION CENTRAL LEFT [place] HERNIATION L 2-3; Surgeon: Loni Dolly, MD; Location: [place]; Service: Orthopedics; Laterality: Left  . Laminectomy Posterior Lumbar Facetectomy & Foraminotomy W/Decomp     BILATERAL LUMBAR  DECOMPRESSION L2-3, L3-4, L4-5; Surgeon: Loni Dolly, MD; Location: [place]; Service: Orthopedics; Laterality: Bilateral;  . PARTIAL MASTECTOMY WITH NEEDLE LOCALIZATION Left 11/29/2014   Procedure: PARTIAL MASTECTOMY WITH NEEDLE LOCALIZATION;  Surgeon: Leonie Green, MD;  Location: [facility] ORS;  Service: General;  Laterality: Left;  . SENTINEL NODE BIOPSY Left 11/29/2014   Procedure: SENTINEL NODE BIOPSY;  Surgeon: Leonie Green, MD;  Location: [facility] ORS;  Service: General;  Laterality: Left;    There were no vitals filed for this visit.   Subjective Assessment - 06/21/20 2043    Subjective  Patient reports her back has still bothered her and she thinks the sideways rock and reach exercise may be making it worse.  Daughter present this session.    Pertinent History Pt was diagnosed with Parkinson's since July 2015.   Pt reports decline in strength in legs, walking, feels "wobble".  Reports feeling fuzzy at times, has some tremors right side.    Patient Stated Goals Pt would like to be as independent as possible, be more stable.    Currently in Pain? Yes    Pain Score 2     Pain Location Back    Pain Orientation Lower    Pain Descriptors / Indicators Aching    Pain Type Chronic pain    Pain Onset More than a month ago    Pain Frequency Intermittent  Maximal Daily Exercises performed in ADAPTED VERSION: Patient seen for initial instruction of LSVT BIG exercises: LSVT Daily Session Maximal Daily Exercises: Sustained movements are designed to rescale the amplitude of movement output for generalization to daily functional activities. Performed as follows for 1 set of 10 repetitions each: Multi directional sustained movements- 1) Floor to ceiling, 2) Side to side. Multi directional Repetitive movements performed in standing and are designed to provide retraining effort needed for sustained muscle activation in tasks Performed as follows: 3) Step and reach forward, 4) Step  and Reach Backwards, 5) Step and reach sideways, 6) Rock and reach forward/backward, 7) Rock and reach sideways (eliminated this date due to back pain)  Pt continued to require min assist for balance with exercises in standing, tactile cues for arm movements. Exercises were performed in an ADAPTED version secondary to decreased balance.  Reaching to behind the head with each arm, therapist mirroring and cues.  Sit to stand from mat table on lowest setting with cues for weight shift, technique andmin assistfor 10 reps for 1 set, patient able to demonstrate improved weight shift forwards  Functional mobility with use of rollator for one trial of 350 feet with min guard and cues for amplitude of movement.    7 hand exercises to help promote handwriting, dexterity and flexibility. 1) Fisting with arm then arm extension with fingers extended with BIG hand movements 2) oppositional movements of the thumb to each digit 3) wrist flex/ext with elbows extended 4) supination/pronation 5) tendon gliding with hook fist and then finger extension 6) tendon gliding with tabletop movement of fingers with MP flexion, PIP and DIP extension 7) MP flexion, PIP flexion, DIP extension Performed with therapist demo and occasional cues for form  Response to tx: Pt with continued back pain this week, modified exercises this date and held the sideways rock and reach exercise to see if this will impact back pain.  Patient demonstrating recall of exercises but still requires assist for proper form and technique. Sit to stand improving without the use of arms, mat table slightly raised 1 inch above chair height this date.  Functional mobility with rollator, responds to cues for amplitude of gait but has difficulty maintaining size of steps.  Continue to work towards goals in plan of care to improve balance, reduce fall risk, improve gait patterns and independence in daily  tasks                     OT Education - 06/21/20 2046    Education Details maximal daily exercises, functional mobility, amplitude of steps    Person(s) Educated Patient;Child(ren)    Methods Explanation;Demonstration;Verbal cues    Comprehension Verbalized understanding;Returned demonstration               OT Long Term Goals - 06/15/20 1418      OT LONG TERM GOAL #1   Title Patient will improve gait speed and endurance to be able to ambulate 600 feet in 6 minutes to negotiate around the home and community safely in 4 weeks.    Baseline 425 feet at eval    Time 4    Period Weeks    Status New    Target Date 07/19/20      OT LONG TERM GOAL #2   Title Patient will complete HEP for maximal daily exercises with modified independence in 4 weeks.    Baseline no current program    Time 4    Period Weeks  Status New    Target Date 07/19/20      OT LONG TERM GOAL #3   Title Patient will transfer from to sit to stand without the use of arms safely and independently from a variety of chairs/surfaces in 4 weeks.    Baseline unable at evaluation    Time 4    Period Weeks    Status New    Target Date 07/19/20      OT LONG TERM GOAL #4   Title Pt will complete toilet transfers with modified independence    Baseline occasional assist from husband    Time 4    Period Weeks    Status New    Target Date 07/19/20      OT LONG TERM GOAL #5   Title Pt will improve ROM in UE to complete hair care with occasional min assist    Baseline moderate to max assist at eval    Time 4    Period Weeks    Status New    Target Date 07/19/20      Long Term Additional Goals   Additional Long Term Goals Yes      OT LONG TERM GOAL #6   Title Patient will don and doff socks with modified independence    Baseline difficulty at eval    Time 4    Period Weeks    Target Date 07/19/20                 Plan - 06/21/20 2101    Clinical Impression Statement Pt with  continued back pain this week, modified exercises this date and held the sideways rock and reach exercise to see if this will impact back pain.  Patient demonstrating recall of exercises but still requires assist for proper form and technique. Sit to stand improving without the use of arms, mat table slightly raised 1 inch above chair height this date.  Functional mobility with rollator, responds to cues for amplitude of gait but has difficulty maintaining size of steps.  Continue to work towards goals in plan of care to improve balance, reduce fall risk, improve gait patterns and independence in daily tasks    OT Occupational Profile and History Detailed Assessment- Review of Records and additional review of physical, cognitive, psychosocial history related to current functional performance    Occupational performance deficits (Please refer to evaluation for details): ADL's;IADL's;Leisure    Body Structure / Function / Physical Skills ADL;Dexterity;Flexibility;ROM;Strength;Balance;Coordination;FMC;IADL;Body mechanics;Endurance;Gait;Pain;UE functional use;GMC;Mobility    Public house manager;Sequencing    Psychosocial Skills Environmental  Adaptations;Habits;Routines and Behaviors    Rehab Potential Good    Clinical Decision Making Several treatment options, min-mod task modification necessary    Comorbidities Affecting Occupational Performance: May have comorbidities impacting occupational performance    Modification or Assistance to Complete Evaluation  No modification of tasks or assist necessary to complete eval    OT Frequency 4x / week    OT Duration 4 weeks    OT Treatment/Interventions Self-care/ADL training;Cryotherapy;Therapeutic exercise;DME and/or AE instruction;Functional Mobility Training;Balance training;Neuromuscular education;Gait Training;Manual Therapy;Moist Heat;Contrast Bath;Passive range of motion;Therapeutic activities;Patient/family education    Consulted and Agree  with Plan of Care Patient;Family member/caregiver    Family Member Consulted husband           Patient will benefit from skilled therapeutic intervention in order to improve the following deficits and impairments:   Body Structure / Function / Physical Skills: ADL,Dexterity,Flexibility,ROM,Strength,Balance,Coordination,FMC,IADL,Body mechanics,Endurance,Gait,Pain,UE functional use,GMC,Mobility Cognitive Skills: Attention,Memory,Sequencing Psychosocial Skills: Environmental  Adaptations,Habits,Routines and Behaviors   Visit Diagnosis: Muscle weakness (generalized)  Other lack of coordination  Parkinson's disease [facility])    Problem List Patient Active Problem List   Diagnosis Date Noted  . Chronic anemia 08/09/2018  . External hemorrhoids   . Hypertrophy of anal papillae   . Loss of weight   . Diverticulosis of large intestine without diverticulitis   . Dehydration 07/15/2018  . Parkinson's disease ([place]) 04/18/2018  . Hyperlipidemia, mixed 10/27/2016  . Benign essential hypertension 06/11/2016  . Malignant neoplasm of central portion of left female breast ([place]) 04/16/2015  . Primary cancer of lower-inner quadrant of left female breast ([place]) 12/04/2014  . Acquired spondylolisthesis 10/11/2013  . B12 deficiency 10/02/2013  . Lumbar disc disease 09/07/2013   Achilles Dunk, OTR/L, CLT  Amontae Ng 06/21/2020, 9:01 PM  [place] MAIN [facility] SERVICES [address] [place], [place], [address] Phone: [phone]   Fax:  [phone]  Name: ARTASIA THANG MRN: [id number] Date of Birth: [birth year]

## 2020-06-22 NOTE — Therapy (Signed)
[place] MAIN [facility] SERVICES [address] [place], [place], [address] Phone: [phone]   Fax:  [phone]  Occupational Therapy Treatment  Patient Details  Name: Kathy Bean MRN: [id number] Date of Birth: 1950-10-13 No data recorded  Encounter Date: 06/20/2020   OT End of Session - 06/22/20 1255    Visit Number 5    Number of Visits 17    Date for OT Re-Evaluation 07/19/20    OT Start Time 1301    OT Stop Time 1400    OT Time Calculation (min) 59 min    Activity Tolerance Patient tolerated treatment well    Behavior During Therapy [facility] for tasks assessed/performed           Past Medical History:  Diagnosis Date  . Breast cancer ([place]) 11/2014   left  . Cancer [facility]) 2016   left breast  . Depression   . Dysrhythmia    heart racing  . Family history of adverse reaction to anesthesia    brother had difficulty going under anesthseia  . Headache    hx of migraines   period induced  . Hypertension   . Osteoarthritis   . Parkinson disease ([place])   . Personal history of radiation therapy     Past Surgical History:  Procedure Laterality Date  . BREAST BIOPSY Left 10/2014   positive  . BREAST LUMPECTOMY Left 2016   invasive mammary  . COLONOSCOPY Left 07/17/2018   Procedure: COLONOSCOPY;  Surgeon: Virgel Manifold, MD;  Location: [facility] ENDOSCOPY;  Service: Endoscopy;  Laterality: Left;  . DILATION AND CURETTAGE OF UTERUS    . ESOPHAGOGASTRODUODENOSCOPY Left 07/17/2018   Procedure: ESOPHAGOGASTRODUODENOSCOPY (EGD);  Surgeon: Virgel Manifold, MD;  Location: [facility] ENDOSCOPY;  Service: Endoscopy;  Laterality: Left;  . Laminectomy Posterior Cervicle Decomp W/Facetectomy & Foraminotomy     [id number] x 3...Marland KitchenMarland KitchenEXCISION CENTRAL LEFT [place] HERNIATION L 2-3; Surgeon: Loni Dolly, MD; Location: [place]; Service: Orthopedics; Laterality: Left  . Laminectomy Posterior Lumbar Facetectomy & Foraminotomy W/Decomp     BILATERAL LUMBAR  DECOMPRESSION L2-3, L3-4, L4-5; Surgeon: Loni Dolly, MD; Location: [place]; Service: Orthopedics; Laterality: Bilateral;  . PARTIAL MASTECTOMY WITH NEEDLE LOCALIZATION Left 11/29/2014   Procedure: PARTIAL MASTECTOMY WITH NEEDLE LOCALIZATION;  Surgeon: Leonie Green, MD;  Location: [facility] ORS;  Service: General;  Laterality: Left;  . SENTINEL NODE BIOPSY Left 11/29/2014   Procedure: SENTINEL NODE BIOPSY;  Surgeon: Leonie Green, MD;  Location: [facility] ORS;  Service: General;  Laterality: Left;    There were no vitals filed for this visit.   Subjective Assessment - 06/22/20 1248    Subjective  Pt reports her back feels better today and wants to continue to hold the sideways rock and reach exercise which involves twisting of the back.  Husband present today    Pertinent History Pt was diagnosed with Parkinson's since July 2015.   Pt reports decline in strength in legs, walking, feels "wobble".  Reports feeling fuzzy at times, has some tremors right side.    Patient Stated Goals Pt would like to be as independent as possible, be more stable.    Currently in Pain? No/denies    Pain Score 0-No pain    Pain Onset More than a month ago           Maximal Daily Exercises performed in ADAPTEDVERSION: Patient seen for initial instruction of LSVT BIG exercises: LSVT Daily Session Maximal Daily Exercises: Sustained movements are  designed to rescale the amplitude of movement output for generalization to daily functional activities. Performed as follows for 1 set of 10 repetitions each: Multi directional sustained movements- 1) Floor to ceiling, 2) Side to side. Multi directional Repetitive movements performed in standing and are designed to provide retraining effort needed for sustained muscle activation in tasks Performed as follows: 3) Step and reach forward, 4) Step and Reach Backwards, 5) Step and reach sideways, 6) Rock and reach forward/backward, 7) Rock and reach sideways (eliminated  this date due to back pain)  Pt continued to require min assist for balance with exercises in standing, tactile cues for arm movements. Exercises were performed in an ADAPTED version secondary to decreased balance.  Reaching to behind the head with each arm, therapist mirroring and cues. Sit to stand from mat table on lowest setting with cues for weight shift, technique andmin assistfor 10 reps for 1 set, patient able to demonstrate improved weight shift forwards  Functional mobility with use of rollator for one trial of 400 feet with min guard and cues for amplitude of movement.   Added LE leg exercises for full leg extensions, ankle pumps, ankle circles forwards/backwards for 1 set of 10 reps each.  Therapist demo and cues provided for proper form technique and size of movement.     7 hand exercises to help promote handwriting, dexterity and flexibility. 1) Fisting with arm then arm extension with fingers extended with BIG hand movements 2) oppositional movements of the thumb to each digit 3) wrist flex/ext with elbows extended 4) supination/pronation 5) tendon gliding with hook fist and then finger extension 6) tendon gliding with tabletop movement of fingers with MP flexion, PIP and DIP extension 7) MP flexion, PIP flexion, DIP extension Performed with therapist demo and occasional cues for form  Response to tx: Pt completed first week of intensive portion of LSVT BIG program.  Pt demonstrating recall of exercises by the end of the week but still requires assist and cues for proper form and technique.  She continues to demonstrate limited amplitude of movement and continues to benefit from instruction to increase size of patterns.  Min assist for exercises in standing, continues to improve with sit to stand transitions from mat table slightly raised and without the use of arms.  Pt to continue with exercises for the weekend twice a day.                         OT Education - 06/22/20 1254    Education Details maximal daily exercises, functional mobility, amplitude of steps    Person(s) Educated Patient;Child(ren)    Methods Explanation;Demonstration;Verbal cues    Comprehension Verbalized understanding;Returned demonstration               OT Long Term Goals - 06/15/20 1418      OT LONG TERM GOAL #1   Title Patient will improve gait speed and endurance to be able to ambulate 600 feet in 6 minutes to negotiate around the home and community safely in 4 weeks.    Baseline 425 feet at eval    Time 4    Period Weeks    Status New    Target Date 07/19/20      OT LONG TERM GOAL #2   Title Patient will complete HEP for maximal daily exercises with modified independence in 4 weeks.    Baseline no current program    Time 4    Period  Weeks    Status New    Target Date 07/19/20      OT LONG TERM GOAL #3   Title Patient will transfer from to sit to stand without the use of arms safely and independently from a variety of chairs/surfaces in 4 weeks.    Baseline unable at evaluation    Time 4    Period Weeks    Status New    Target Date 07/19/20      OT LONG TERM GOAL #4   Title Pt will complete toilet transfers with modified independence    Baseline occasional assist from husband    Time 4    Period Weeks    Status New    Target Date 07/19/20      OT LONG TERM GOAL #5   Title Pt will improve ROM in UE to complete hair care with occasional min assist    Baseline moderate to max assist at eval    Time 4    Period Weeks    Status New    Target Date 07/19/20      Long Term Additional Goals   Additional Long Term Goals Yes      OT LONG TERM GOAL #6   Title Patient will don and doff socks with modified independence    Baseline difficulty at eval    Time 4    Period Weeks    Target Date 07/19/20                 Plan - 06/22/20 1255    Clinical Impression Statement Pt completed  first week of intensive portion of LSVT BIG program.  Pt demonstrating recall of exercises by the end of the week but still requires assist and cues for proper form and technique.  She continues to demonstrate limited amplitude of movement and continues to benefit from instruction to increase size of patterns.  Min assist for exercises in standing, continues to improve with sit to stand transitions from mat table slightly raised and without the use of arms.  Pt to continue with exercises for the weekend twice a day.    OT Occupational Profile and History Detailed Assessment- Review of Records and additional review of physical, cognitive, psychosocial history related to current functional performance    Occupational performance deficits (Please refer to evaluation for details): ADL's;IADL's;Leisure    Body Structure / Function / Physical Skills ADL;Dexterity;Flexibility;ROM;Strength;Balance;Coordination;FMC;IADL;Body mechanics;Endurance;Gait;Pain;UE functional use;GMC;Mobility    Geophysicist/field seismologist;Sequencing    Psychosocial Skills Environmental  Adaptations;Habits;Routines and Behaviors    Rehab Potential Good    Clinical Decision Making Several treatment options, min-mod task modification necessary    Comorbidities Affecting Occupational Performance: May have comorbidities impacting occupational performance    Modification or Assistance to Complete Evaluation  No modification of tasks or assist necessary to complete eval    OT Frequency 4x / week    OT Duration 4 weeks    OT Treatment/Interventions Self-care/ADL training;Cryotherapy;Therapeutic exercise;DME and/or AE instruction;Functional Mobility Training;Balance training;Neuromuscular education;Gait Training;Manual Therapy;Moist Heat;Contrast Bath;Passive range of motion;Therapeutic activities;Patient/family education    Consulted and Agree with Plan of Care Patient;Family member/caregiver    Family Member Consulted husband            Patient will benefit from skilled therapeutic intervention in order to improve the following deficits and impairments:   Body Structure / Function / Physical Skills: ADL,Dexterity,Flexibility,ROM,Strength,Balance,Coordination,FMC,IADL,Body mechanics,Endurance,Gait,Pain,UE functional use,GMC,Mobility Cognitive Skills: Attention,Memory,Sequencing Psychosocial Skills: Environmental  Adaptations,Habits,Routines and Behaviors   Visit Diagnosis: Muscle weakness (  generalized)  Other lack of coordination  Parkinson's disease ([place])  Unsteadiness on feet  Difficulty in walking, not elsewhere classified    Problem List Patient Active Problem List   Diagnosis Date Noted  . Chronic anemia 08/09/2018  . External hemorrhoids   . Hypertrophy of anal papillae   . Loss of weight   . Diverticulosis of large intestine without diverticulitis   . Dehydration 07/15/2018  . Parkinson's disease ([place]) 04/18/2018  . Hyperlipidemia, mixed 10/27/2016  . Benign essential hypertension 06/11/2016  . 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 Primary cancer of lower-inner quadrant of left female breast ([place]) 12/04/2014  . Acquired spondylolisthesis 10/11/2013  . B12 deficiency 10/02/2013  . Lumbar disc disease 09/07/2013   Achilles Dunk, OTR/L, CLT  Ramces Shomaker 06/22/2020, 1:02 PM  [place] MAIN [facility] SERVICES [address] [place], [place], [address] Phone: [phone]   Fax:  [phone]  Name: Kathy Bean MRN: [id number] Date of Birth: 03-23-1950

## 2020-06-24 ENCOUNTER — Ambulatory Visit: Payer: Medicare Other | Admitting: Speech Pathology

## 2020-06-24 ENCOUNTER — Ambulatory Visit: Payer: Medicare Other | Admitting: Occupational Therapy

## 2020-06-24 ENCOUNTER — Other Ambulatory Visit: Payer: Self-pay

## 2020-06-24 DIAGNOSIS — M6281 Muscle weakness (generalized): Secondary | ICD-10-CM

## 2020-06-24 DIAGNOSIS — R278 Other lack of coordination: Secondary | ICD-10-CM

## 2020-06-24 DIAGNOSIS — G20 Parkinson's disease: Secondary | ICD-10-CM

## 2020-06-24 DIAGNOSIS — R471 Dysarthria and anarthria: Secondary | ICD-10-CM

## 2020-06-24 NOTE — Therapy (Signed)
[place] MAIN [facility] SERVICES [address] [place], [place], [address] Phone: [phone]   Fax:  [phone]  Speech Language Pathology Treatment  Patient Details  Name: Kathy Bean MRN: [id number] Date of Birth: [birth year] Referring Provider (SLP): Dr. Jennings Books   Encounter Date: 06/24/2020   End of Session - 06/24/20 1530    Visit Number 2    Number of Visits 17    Date for SLP Re-Evaluation 09/08/20    Authorization Type Medicare    Authorization - Visit Number 2    Progress Note Due on Visit 10    SLP Start Time 1400    SLP Stop Time  1502    SLP Time Calculation (min) 62 min    Activity Tolerance Patient tolerated treatment well           Past Medical History:  Diagnosis Date  . Breast cancer ([place]) 11/2014   left  . Cancer [facility]) 2016   left breast  . Depression   . Dysrhythmia    heart racing  . Family history of adverse reaction to anesthesia    brother had difficulty going under anesthseia  . Headache    hx of migraines   period induced  . Hypertension   . Osteoarthritis   . Parkinson disease ([place])   . Personal history of radiation therapy     Past Surgical History:  Procedure Laterality Date  . BREAST BIOPSY Left 10/2014   positive  . BREAST LUMPECTOMY Left 2016   invasive mammary  . COLONOSCOPY Left 07/17/2018   Procedure: COLONOSCOPY;  Surgeon: Virgel Manifold, MD;  Location: [facility] ENDOSCOPY;  Service: Endoscopy;  Laterality: Left;  . DILATION AND CURETTAGE OF UTERUS    . ESOPHAGOGASTRODUODENOSCOPY Left 07/17/2018   Procedure: ESOPHAGOGASTRODUODENOSCOPY (EGD);  Surgeon: Virgel Manifold, MD;  Location: [facility] ENDOSCOPY;  Service: Endoscopy;  Laterality: Left;  . Laminectomy Posterior Cervicle Decomp W/Facetectomy & Foraminotomy     [id number] x 3...Marland KitchenMarland KitchenEXCISION CENTRAL LEFT [place] HERNIATION L 2-3; Surgeon: Loni Dolly, MD; Location: [place]; Service: Orthopedics; Laterality: Left  . Laminectomy  Posterior Lumbar Facetectomy & Foraminotomy W/Decomp     BILATERAL LUMBAR DECOMPRESSION L2-3, L3-4, L4-5; Surgeon: Loni Dolly, MD; Location: [place]; Service: Orthopedics; Laterality: Bilateral;  . PARTIAL MASTECTOMY WITH NEEDLE LOCALIZATION Left 11/29/2014   Procedure: PARTIAL MASTECTOMY WITH NEEDLE LOCALIZATION;  Surgeon: Leonie Green, MD;  Location: [facility] ORS;  Service: General;  Laterality: Left;  . SENTINEL NODE BIOPSY Left 11/29/2014   Procedure: SENTINEL NODE BIOPSY;  Surgeon: Leonie Green, MD;  Location: [facility] ORS;  Service: General;  Laterality: Left;    There were no vitals filed for this visit.   Subjective Assessment - 06/24/20 1519    Subjective Pt greeted SLP in loud voice.    Currently in Pain? No/denies                 ADULT SLP TREATMENT - 06/24/20 1521      General Information   Behavior/Cognition Alert;Cooperative    HPI Kathy Bean is a 70 y.o. female referred by Dr. 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 Per chart review pt with hypophonia, dysphagia      Treatment Provided   Treatment provided Cognitive-Linquistic      Pain Assessment   Pain Assessment No/denies pain      Cognitive-Linquistic Treatment   Treatment focused on Dysarthria;Patient/family/caregiver education    Skilled Treatment LSVT Daily tasks with min-mod cues  for glottal attack, shaping of vocal quality. Loud /a/ averaged 83 dB, duration 10.5 seconds, pitch avg 249 Hz.  Max F0 for pitch glides was 338 Hz (84 dB avg), min F0 for low glides was 191 Hz (85 dB avg). Pt required cues to slow rate/repetitions. Functional phrases averaged 85 dB with min cues. Word and phrase level reading tasks averaged 84 dB with cues to slow rate required.  With cognitive load, rate and variability of vocal quality increased, requiring mod cues, average 82 dB. Spontaneous responses between tasks averaged 82 dB with occasional cues for vocal quality.      Assessment /  Recommendations / Plan   Plan Continue with current plan of care      Progression Toward Goals   Progression toward goals Progressing toward goals            SLP Education - 06/24/20 1529    Education Details slow rate, breath support    Person(s) Educated Patient    Methods Explanation;Demonstration;Verbal cues    Comprehension Verbalized understanding;Returned demonstration;Verbal cues required;Need further instruction            SLP Short Term Goals - 06/10/20 1750      SLP SHORT TERM GOAL #1   Title The patient will complete Daily Tasks (Maximum duration "ah", High/Lows, and Functional Phrases) at average loudness >/= 85 dB and with loud, good quality voice with min cues.    Time 10   sessions   Status New      SLP SHORT TERM GOAL #2   Title The patient will complete Hierarchal Speech Loudness reading drills (words/phrases, sentences) at average >/= 75 dB and with loud, good quality voice with min cues.    Time 10   sessions   Period Weeks    Status New      SLP SHORT TERM GOAL #3   Title The patient will participate in 5-8 minutes conversation, maintaining average loudness of 75 dB and loud, good quality voice with min cues.    Time 10   sessions   Status New            SLP Long Term Goals - 06/11/20 1408      SLP LONG TERM GOAL #1   Title The patient will complete Daily Tasks (Maximum duration "ah", High/Lows, and Functional Phrases) at average loudness >/= 85 dB and with loud, good quality voice.    Time 4   or 17 sessions, for all LTGs   Period Weeks    Status New    Target Date 07/19/20      SLP LONG TERM GOAL #2   Title The patient will complete Hierarchal Speech Loudness reading drills (words/phrases, sentences, and paragraph) at average >/= 75 dB and with loud, good quality voice.    Time 4    Period Weeks    Status New    Target Date 07/19/20      SLP LONG TERM GOAL #3   Title The patient will participate in 15-20 minutes conversation,  maintaining average loudness of >/= 75 dB and loud, good quality voice.    Time 4    Period Weeks    Status New    Target Date 07/19/20      SLP LONG TERM GOAL #4   Title Patient will report improved communication effectiveness as measured by Communicative Effectiveness Survey.    Baseline 21/32 on 06/10/2020, higher score = "More effective"    Time 4  Period Weeks    Status New    Target Date 07/19/20      SLP LONG TERM GOAL #5   Title Patient will participate in Modified Barium Swallow study for assessment of swallow function if clinically indicated.    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 She also reports intermittent dysphagia with liquids; to be assessed clinically and may consider objective swallowing evaluation if indicated; no overt signs of aspiration were observed as pt consumed water throughout session today. Pt's dysarthria manifests primarily as hoarse, raspy vocal quality with reduced pitch variation. Rapid rate was also noted today during hierarchical tasks and conversation. I recommend skilled ST for LSVT-LOUD treatment 4 days a week x4 weeks to improve vocal quality and ability to communicate with family and friends in the community.    Speech Therapy Frequency 4x / week    Duration 4 weeks    Treatment/Interventions Aspiration precaution training;SLP instruction and feedback;Functional tasks;Patient/family education;Cueing hierarchy;Other (comment)   LSVT-LOUD   Potential to Achieve Goals Good    SLP Home Exercise Plan LSVT LOUD homework provided    Consulted and Agree with Plan of Care Patient;Family member/caregiver           Patient will benefit from skilled therapeutic intervention in order to improve the following deficits and impairments:   Dysarthria and  anarthria  Parkinson's disease [facility])    Problem List Patient Active Problem List   Diagnosis Date Noted  . Chronic anemia 08/09/2018  . External hemorrhoids   . Hypertrophy of anal papillae   . Loss of weight   . Diverticulosis of large intestine without diverticulitis   . Dehydration 07/15/2018  . Parkinson's disease ([place]) 04/18/2018  . Hyperlipidemia, mixed 10/27/2016  . Benign essential hypertension 06/11/2016  . 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 Primary cancer of lower-inner quadrant of left female breast ([place]) 12/04/2014  . Acquired spondylolisthesis 10/11/2013  . B12 deficiency 10/02/2013  . Lumbar disc disease 09/07/2013   Deneise Lever, [place], [place] Speech-Language Pathologist  Aliene Altes 06/24/2020, 3:33 PM  [place] MAIN [facility] SERVICES [address] [place], [place], [address] Phone: [phone]   Fax:  [phone]   Name: Kathy Bean MRN: [id number] Date of Birth: Jul 01, 1950

## 2020-06-25 ENCOUNTER — Ambulatory Visit: Payer: Medicare Other | Admitting: Occupational Therapy

## 2020-06-25 ENCOUNTER — Ambulatory Visit: Payer: Medicare Other | Admitting: Speech Pathology

## 2020-06-25 DIAGNOSIS — M6281 Muscle weakness (generalized): Secondary | ICD-10-CM

## 2020-06-25 DIAGNOSIS — G20 Parkinson's disease: Secondary | ICD-10-CM

## 2020-06-25 DIAGNOSIS — R262 Difficulty in walking, not elsewhere classified: Secondary | ICD-10-CM

## 2020-06-25 DIAGNOSIS — R471 Dysarthria and anarthria: Secondary | ICD-10-CM

## 2020-06-25 DIAGNOSIS — R2681 Unsteadiness on feet: Secondary | ICD-10-CM

## 2020-06-25 DIAGNOSIS — R278 Other lack of coordination: Secondary | ICD-10-CM

## 2020-06-25 NOTE — Therapy (Signed)
[place] MAIN [facility] SERVICES [address] [place], [place], [address] Phone: [phone]   Fax:  [phone]  Speech Language Pathology Treatment  Patient Details  Name: Kathy Bean MRN: [id number] Date of Birth: [birth year] Referring Provider (SLP): Dr. Jennings Books   Encounter Date: 06/25/2020   End of Session - 06/25/20 1524    Visit Number 3    Number of Visits 17    Date for SLP Re-Evaluation 09/08/20    Authorization Type Medicare    Authorization - Visit Number 3    Progress Note Due on Visit 10    SLP Start Time 1400    SLP Stop Time  1500    SLP Time Calculation (min) 60 min    Activity Tolerance Patient tolerated treatment well           Past Medical History:  Diagnosis Date  . Breast cancer ([place]) 11/2014   left  . Cancer [facility]) 2016   left breast  . Depression   . Dysrhythmia    heart racing  . Family history of adverse reaction to anesthesia    brother had difficulty going under anesthseia  . Headache    hx of migraines   period induced  . Hypertension   . Osteoarthritis   . Parkinson disease ([employer])   . Personal history of radiation therapy     Past Surgical History:  Procedure Laterality Date  . BREAST BIOPSY Left 10/2014   positive  . BREAST LUMPECTOMY Left 2016   invasive mammary  . COLONOSCOPY Left 07/17/2018   Procedure: COLONOSCOPY;  Surgeon: Kathy Manifold, MD;  Location: [facility] ENDOSCOPY;  Service: Endoscopy;  Laterality: Left;  . DILATION AND CURETTAGE OF UTERUS    . ESOPHAGOGASTRODUODENOSCOPY Left 07/17/2018   Procedure: ESOPHAGOGASTRODUODENOSCOPY (EGD);  Surgeon: Kathy Manifold, MD;  Location: [facility] ENDOSCOPY;  Service: Endoscopy;  Laterality: Left;  . Laminectomy Posterior Cervicle Decomp W/Facetectomy & Foraminotomy     [id number] x 3...Marland KitchenMarland KitchenEXCISION CENTRAL LEFT [place] HERNIATION L 2-3; Surgeon: Kathy Dolly, MD; Location: [place]; Service: Orthopedics; Laterality: Left  . Laminectomy  Posterior Lumbar Facetectomy & Foraminotomy W/Decomp     BILATERAL LUMBAR DECOMPRESSION L2-3, L3-4, L4-5; Surgeon: Kathy Dolly, MD; Location: [place]; Service: Orthopedics; Laterality: Bilateral;  . PARTIAL MASTECTOMY WITH NEEDLE LOCALIZATION Left 11/29/2014   Procedure: PARTIAL MASTECTOMY WITH NEEDLE LOCALIZATION;  Surgeon: Kathy Green, MD;  Location: [facility] ORS;  Service: General;  Laterality: Left;  . SENTINEL NODE BIOPSY Left 11/29/2014   Procedure: SENTINEL NODE BIOPSY;  Surgeon: Kathy Green, MD;  Location: [facility] ORS;  Service: General;  Laterality: Left;    There were no vitals filed for this visit.   Subjective Assessment - 06/25/20 1507    Subjective "Same- a little gravelly" (re: voice)    Currently in Pain? No/denies                 ADULT SLP TREATMENT - 06/25/20 1507      General Information   Behavior/Cognition Alert;Cooperative    HPI Kathy Bean is a 70 y.o. female referred by Dr. 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 Per chart review pt with hypophonia, dysphagia      Treatment Provided   Treatment provided Cognitive-Linquistic      Cognitive-Linquistic Treatment   Treatment focused on Dysarthria;Patient/family/caregiver education    Skilled Treatment LSVT Daily tasks: Loud /a/ averaged 83 dB, duration ~9 seconds, pitch avg 265 Hz; patient required moderate cues  and modelling for shaping of vocal quality and breath support. Max F0 for pitch glides was 354 Hz (82 dB avg), min F0 for low glides was 199 Hz (84 dB avg); patient required moderate cues for pitch for highs, min cues for lows. Off-the-cuff responses between tasks averaged 79 dB. Functional phrases averaged 86 dB with min cues. Phrase level reading tasks averaged 85 dB; patient benefitted from modeliing vs verbal instruction to slow rate and increase breath support. She was able to maintain loudness and vocal quality with addition of cognitive load after initial feedback  provided re: fading vocal intensity. Progressed to simple conversation with patient maintaining carryover of loudness and quality for periods of 1-2 minutes at time.      Assessment / Recommendations / Plan   Plan Continue with current plan of care      Progression Toward Goals   Progression toward goals Progressing toward goals            SLP Education - 06/25/20 1524    Education Details need to use loud voice to be normal volume    Person(s) Educated Patient    Methods Explanation    Comprehension Verbalized understanding            SLP Short Term Goals - 06/10/20 1750      SLP SHORT TERM GOAL #1   Title The patient will complete Daily Tasks (Maximum duration "ah", High/Lows, and Functional Phrases) at average loudness >/= 85 dB and with loud, good quality voice with min cues.    Time 10   sessions   Status New      SLP SHORT TERM GOAL #2   Title The patient will complete Hierarchal Speech Loudness reading drills (words/phrases, sentences) at average >/= 75 dB and with loud, good quality voice with min cues.    Time 10   sessions   Period Weeks    Status New      SLP SHORT TERM GOAL #3   Title The patient will participate in 5-8 minutes conversation, maintaining average loudness of 75 dB and loud, good quality voice with min cues.    Time 10   sessions   Status New            SLP Long Term Goals - 06/11/20 1408      SLP LONG TERM GOAL #1   Title The patient will complete Daily Tasks (Maximum duration "ah", High/Lows, and Functional Phrases) at average loudness >/= 85 dB and with loud, good quality voice.    Time 4   or 17 sessions, for all LTGs   Period Weeks    Status New    Target Date 07/19/20      SLP LONG TERM GOAL #2   Title The patient will complete Hierarchal Speech Loudness reading drills (words/phrases, sentences, and paragraph) at average >/= 75 dB and with loud, good quality voice.    Time 4    Period Weeks    Status New    Target Date 07/19/20       SLP LONG TERM GOAL #3   Title The patient will participate in 15-20 minutes conversation, maintaining average loudness of >/= 75 dB and loud, good quality voice.    Time 4    Period Weeks    Status New    Target Date 07/19/20      SLP LONG TERM GOAL #4   Title Patient will report improved communication effectiveness as measured by Communicative Effectiveness Survey.  Baseline 21/32 on 06/10/2020, higher score = "More effective"    Time 4    Period Weeks    Status New    Target Date 07/19/20      SLP LONG TERM GOAL #5   Title Patient will participate in Modified Barium Swallow study for assessment of swallow function if clinically indicated.    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 She also reports intermittent dysphagia with liquids; to be assessed clinically and may consider objective swallowing evaluation if indicated; no overt signs of aspiration were observed as pt consumed water throughout session today. Patient benefitted from clinician model vs verbal instruction to improve vocal quality, pitch, and loudness and demonstrates emerging awareness and ability to self-correct vocal quality in structured tasks. I recommend skilled ST for LSVT-LOUD treatment 4 days a week x4 weeks to improve vocal quality and ability to communicate with family and friends in the community.    Speech Therapy Frequency 4x / week    Duration 4 weeks    Treatment/Interventions Aspiration precaution training;SLP instruction and feedback;Functional tasks;Patient/family education;Cueing hierarchy;Other (comment)   LSVT-LOUD   Potential to Achieve Goals Good    SLP Home Exercise Plan LSVT LOUD homework provided    Consulted and Agree with Plan of Care Patient;Family member/caregiver           Patient will benefit from  skilled therapeutic intervention in order to improve the following deficits and impairments:   Dysarthria and anarthria  Parkinson's disease [facility])    Problem List Patient Active Problem List   Diagnosis Date Noted  . Chronic anemia 08/09/2018  . External hemorrhoids   . Hypertrophy of anal papillae   . Loss of weight   . Diverticulosis of large intestine without diverticulitis   . Dehydration 07/15/2018  . Parkinson's disease ([place]) 04/18/2018  . Hyperlipidemia, mixed 10/27/2016  . Benign essential hypertension 06/11/2016  . Malignant neoplasm of central portion of left female breast ([place]) 04/16/2015  . Primary cancer of lower-inner quadrant of left female breast ([place]) 12/04/2014  . Acquired spondylolisthesis 10/11/2013  . B12 deficiency 10/02/2013  . Lumbar disc disease 09/07/2013   Deneise Lever, [place], [place] Speech-Language Pathologist   Aliene Altes 06/25/2020, 3:28 PM  [place] MAIN [facility] SERVICES [address] [place], [place], [address] Phone: [phone]   Fax:  [phone]   Name: CORENA TILSON MRN: [id number] Date of Birth: Dec 11, 1950

## 2020-06-26 ENCOUNTER — Ambulatory Visit: Payer: Medicare Other | Admitting: Occupational Therapy

## 2020-06-26 ENCOUNTER — Other Ambulatory Visit: Payer: Self-pay

## 2020-06-26 ENCOUNTER — Ambulatory Visit: Payer: Medicare Other | Admitting: Speech Pathology

## 2020-06-26 DIAGNOSIS — M6281 Muscle weakness (generalized): Secondary | ICD-10-CM

## 2020-06-26 DIAGNOSIS — R278 Other lack of coordination: Secondary | ICD-10-CM

## 2020-06-26 DIAGNOSIS — R262 Difficulty in walking, not elsewhere classified: Secondary | ICD-10-CM

## 2020-06-26 DIAGNOSIS — R2681 Unsteadiness on feet: Secondary | ICD-10-CM

## 2020-06-26 DIAGNOSIS — R471 Dysarthria and anarthria: Secondary | ICD-10-CM

## 2020-06-26 DIAGNOSIS — G20 Parkinson's disease: Secondary | ICD-10-CM

## 2020-06-26 NOTE — Patient Instructions (Signed)
1. Where's the kitty?  2. How did you sleep?  3. What's the weather like outside?  4. What kind of movies are on?  5. Have you talked to your sisters?  6. Can you get my medicine?  7. What do you want for dinner?  8. It's too hot to cook!  9. Get me a long-sleeved blouse.

## 2020-06-27 ENCOUNTER — Ambulatory Visit: Payer: Medicare Other | Admitting: Speech Pathology

## 2020-06-27 ENCOUNTER — Ambulatory Visit: Payer: Medicare Other | Admitting: Occupational Therapy

## 2020-06-27 ENCOUNTER — Encounter: Payer: Self-pay | Admitting: Occupational Therapy

## 2020-06-27 DIAGNOSIS — R471 Dysarthria and anarthria: Secondary | ICD-10-CM

## 2020-06-27 DIAGNOSIS — R2681 Unsteadiness on feet: Secondary | ICD-10-CM

## 2020-06-27 DIAGNOSIS — R262 Difficulty in walking, not elsewhere classified: Secondary | ICD-10-CM

## 2020-06-27 DIAGNOSIS — G20 Parkinson's disease: Secondary | ICD-10-CM

## 2020-06-27 DIAGNOSIS — R278 Other lack of coordination: Secondary | ICD-10-CM

## 2020-06-27 DIAGNOSIS — M6281 Muscle weakness (generalized): Secondary | ICD-10-CM

## 2020-06-27 NOTE — Therapy (Signed)
[place] MAIN [facility] SERVICES [address] [place], [place], [address] Phone: [phone]   Fax:  [phone]  Speech Language Pathology Treatment  Patient Details  Name: Kathy Bean MRN: [id number] Date of Birth: Jul 01, 1950 Referring Provider (SLP): Dr. Jennings Books   Encounter Date: 06/27/2020   End of Session - 06/27/20 1709    Visit Number 5    Number of Visits 17    Date for SLP Re-Evaluation 09/08/20    Authorization Type Medicare    Authorization - Visit Number 5    Progress Note Due on Visit 10    SLP Start Time 1400    SLP Stop Time  1500    SLP Time Calculation (min) 60 min    Activity Tolerance Patient tolerated treatment well           Past Medical History:  Diagnosis Date  . Breast cancer ([place]) 11/2014   left  . Cancer [facility]) 2016   left breast  . Depression   . Dysrhythmia    heart racing  . Family history of adverse reaction to anesthesia    brother had difficulty going under anesthseia  . Headache    hx of migraines   period induced  . Hypertension   . Osteoarthritis   . Parkinson disease ([place])   . Personal history of radiation therapy     Past Surgical History:  Procedure Laterality Date  . BREAST BIOPSY Left 10/2014   positive  . BREAST LUMPECTOMY Left 2016   invasive mammary  . COLONOSCOPY Left 07/17/2018   Procedure: COLONOSCOPY;  Surgeon: Virgel Manifold, MD;  Location: [facility] ENDOSCOPY;  Service: Endoscopy;  Laterality: Left;  . DILATION AND CURETTAGE OF UTERUS    . ESOPHAGOGASTRODUODENOSCOPY Left 07/17/2018   Procedure: ESOPHAGOGASTRODUODENOSCOPY (EGD);  Surgeon: Virgel Manifold, MD;  Location: [facility] ENDOSCOPY;  Service: Endoscopy;  Laterality: Left;  . Laminectomy Posterior Cervicle Decomp W/Facetectomy & Foraminotomy     [id number] x 3...Marland KitchenMarland KitchenEXCISION CENTRAL LEFT [place] HERNIATION L 2-3; Surgeon: Loni Dolly, MD; Location: [place]; Service: Orthopedics; Laterality: Left  . Laminectomy  Posterior Lumbar Facetectomy & Foraminotomy W/Decomp     BILATERAL LUMBAR DECOMPRESSION L2-3, L3-4, L4-5; Surgeon: Loni Dolly, MD; Location: [place]; Service: Orthopedics; Laterality: Bilateral;  . PARTIAL MASTECTOMY WITH NEEDLE LOCALIZATION Left 11/29/2014   Procedure: PARTIAL MASTECTOMY WITH NEEDLE LOCALIZATION;  Surgeon: Leonie Green, MD;  Location: [facility] ORS;  Service: General;  Laterality: Left;  . SENTINEL NODE BIOPSY Left 11/29/2014   Procedure: SENTINEL NODE BIOPSY;  Surgeon: Leonie Green, MD;  Location: [facility] ORS;  Service: General;  Laterality: Left;    There were no vitals filed for this visit.   Subjective Assessment - 06/27/20 1706    Subjective "I feel phony talking like this."    Currently in Pain? No/denies                 ADULT SLP TREATMENT - 06/27/20 1706      General Information   Behavior/Cognition Alert;Cooperative;Pleasant mood    HPI Kathy Bean is a 70 y.o. female referred by Dr. 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 Per chart review pt with hypophonia, dysphagia      Treatment Provided   Treatment provided Cognitive-Linquistic      Pain Assessment   Pain Assessment No/denies pain      Cognitive-Linquistic Treatment   Treatment focused on Dysarthria;Patient/family/caregiver education    Skilled Treatment LSVT Daily tasks: Loud /a/  averaged 84 dB, duration ~7.5 seconds, patient required min-moderate cues and modeling for shaping of vocal quality and breath support. Max F0 for pitch glides was 368 Hz (87 dB avg), min F0 for low glides was 164 Hz (85 dB avg); patient required min cues for slowing rate; SLP used visual non-verbal cue. Functional phrases averaged 87 dB. Phrase level reading tasks averaged 86 dB; SLP made aware when spontaneous responses and "asides" dipped in intensity, and pt corrected these with min cues. Able to maintain vocal quality/intensity for short conversational responses with occasional min  cues.      Assessment / Recommendations / Plan   Plan Continue with current plan of care      Progression Toward Goals   Progression toward goals Progressing toward goals            SLP Education - 06/27/20 1709    Education Details voice is normal loudness even though she feels too loud    Person(s) Educated Patient    Methods Explanation    Comprehension Verbalized understanding            SLP Short Term Goals - 06/10/20 1750      SLP SHORT TERM GOAL #1   Title The patient will complete Daily Tasks (Maximum duration "ah", High/Lows, and Functional Phrases) at average loudness >/= 85 dB and with loud, good quality voice with min cues.    Time 10   sessions   Status New      SLP SHORT TERM GOAL #2   Title The patient will complete Hierarchal Speech Loudness reading drills (words/phrases, sentences) at average >/= 75 dB and with loud, good quality voice with min cues.    Time 10   sessions   Period Weeks    Status New      SLP SHORT TERM GOAL #3   Title The patient will participate in 5-8 minutes conversation, maintaining average loudness of 75 dB and loud, good quality voice with min cues.    Time 10   sessions   Status New            SLP Long Term Goals - 06/11/20 1408      SLP LONG TERM GOAL #1   Title The patient will complete Daily Tasks (Maximum duration "ah", High/Lows, and Functional Phrases) at average loudness >/= 85 dB and with loud, good quality voice.    Time 4   or 17 sessions, for all LTGs   Period Weeks    Status New    Target Date 07/19/20      SLP LONG TERM GOAL #2   Title The patient will complete Hierarchal Speech Loudness reading drills (words/phrases, sentences, and paragraph) at average >/= 75 dB and with loud, good quality voice.    Time 4    Period Weeks    Status New    Target Date 07/19/20      SLP LONG TERM GOAL #3   Title The patient will participate in 15-20 minutes conversation, maintaining average loudness of >/= 75 dB and  loud, good quality voice.    Time 4    Period Weeks    Status New    Target Date 07/19/20      SLP LONG TERM GOAL #4   Title Patient will report improved communication effectiveness as measured by Communicative Effectiveness Survey.    Baseline 21/32 on 06/10/2020, higher score = "More effective"    Time 4    Period Weeks  Status New    Target Date 07/19/20      SLP LONG TERM GOAL #5   Title Patient will participate in Modified Barium Swallow study for assessment of swallow function if clinically indicated.    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 She also reports intermittent dysphagia with liquids; to be monitored clinically and may consider objective swallowing evaluation if indicated; no overt signs of aspiration were observed as pt consumed water throughout session today. Patient benefitted from use of audio feedback to help her understand normal voicing even when she feels she is shouting. I recommend skilled ST for LSVT-LOUD treatment 4 days a week x4 weeks to improve vocal quality and ability to communicate with family and friends in the community.    Speech Therapy Frequency 4x / week    Duration 4 weeks    Treatment/Interventions Aspiration precaution training;SLP instruction and feedback;Functional tasks;Patient/family education;Cueing hierarchy;Other (comment)   LSVT-LOUD   Potential to Achieve Goals Good    SLP Home Exercise Plan LSVT LOUD homework provided    Consulted and Agree with Plan of Care Patient;Family member/caregiver           Patient will benefit from skilled therapeutic intervention in order to improve the following deficits and impairments:   Dysarthria and anarthria  Parkinson's disease [facility])    Problem List Patient Active Problem List   Diagnosis Date Noted  .  Chronic anemia 08/09/2018  . External hemorrhoids   . Hypertrophy of anal papillae   . Loss of weight   . Diverticulosis of large intestine without diverticulitis   . Dehydration 07/15/2018  . Parkinson's disease ([place]) 04/18/2018  . Hyperlipidemia, mixed 10/27/2016  . Benign essential hypertension 06/11/2016  . 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 Primary cancer of lower-inner quadrant of left female breast ([place]) 12/04/2014  . Acquired spondylolisthesis 10/11/2013  . B12 deficiency 10/02/2013  . Lumbar disc disease 09/07/2013   Kathy Bean, [place], [place] Speech-Language Pathologist   Kathy Bean 06/27/2020, 5:11 PM  [place] MAIN [place] [address] [place], [place], [address] Phone: [phone]   Fax:  [phone]   Name: Kathy Bean MRN: [id number] Date of Birth: Sep 21, 1950

## 2020-06-27 NOTE — Therapy (Signed)
[place] MAIN [facility] SERVICES [address] [place], [place], [address] Phone: [phone]   Fax:  [phone]  Occupational Therapy Treatment  Patient Details  Name: Kathy Bean MRN: [id number] Date of Birth: January 13, 1951 No data recorded  Encounter Date: 06/24/2020   OT End of Session - 06/27/20 1729    Visit Number 6    Number of Visits 17    Date for OT Re-Evaluation 07/19/20    OT Start Time 1300    OT Stop Time 1358    OT Time Calculation (min) 58 min    Activity Tolerance Patient tolerated treatment well    Behavior During Therapy [facility] for tasks assessed/performed           Past Medical History:  Diagnosis Date  . Breast cancer ([place]) 11/2014   left  . Cancer [facility]) 2016   left breast  . Depression   . Dysrhythmia    heart racing  . Family history of adverse reaction to anesthesia    brother had difficulty going under anesthseia  . Headache    hx of migraines   period induced  . Hypertension   . Osteoarthritis   . Parkinson disease ([place])   . Personal history of radiation therapy     Past Surgical History:  Procedure Laterality Date  . BREAST BIOPSY Left 10/2014   positive  . BREAST LUMPECTOMY Left 2016   invasive mammary  . COLONOSCOPY Left 07/17/2018   Procedure: COLONOSCOPY;  Surgeon: Virgel Manifold, MD;  Location: [facility] ENDOSCOPY;  Service: Endoscopy;  Laterality: Left;  . DILATION AND CURETTAGE OF UTERUS    . ESOPHAGOGASTRODUODENOSCOPY Left 07/17/2018   Procedure: ESOPHAGOGASTRODUODENOSCOPY (EGD);  Surgeon: Virgel Manifold, MD;  Location: [facility] ENDOSCOPY;  Service: Endoscopy;  Laterality: Left;  . Laminectomy Posterior Cervicle Decomp W/Facetectomy & Foraminotomy     [id number] x 3...Marland KitchenMarland KitchenEXCISION CENTRAL LEFT [place] HERNIATION L 2-3; Surgeon: Loni Dolly, MD; Location: [place]; Service: Orthopedics; Laterality: Left  . Laminectomy Posterior Lumbar Facetectomy & Foraminotomy W/Decomp     BILATERAL LUMBAR  DECOMPRESSION L2-3, L3-4, L4-5; Surgeon: Loni Dolly, MD; Location: [place]; Service: Orthopedics; Laterality: Bilateral;  . PARTIAL MASTECTOMY WITH NEEDLE LOCALIZATION Left 11/29/2014   Procedure: PARTIAL MASTECTOMY WITH NEEDLE LOCALIZATION;  Surgeon: Leonie Green, MD;  Location: [facility] ORS;  Service: General;  Laterality: Left;  . SENTINEL NODE BIOPSY Left 11/29/2014   Procedure: SENTINEL NODE BIOPSY;  Surgeon: Leonie Green, MD;  Location: [facility] ORS;  Service: General;  Laterality: Left;    There were no vitals filed for this visit.   Subjective Assessment - 06/27/20 1725    Subjective  Pt reports she didn't get much time to do her exercises over the weekend, reports she realizes for the best outcomes she will need to perform them twice a day.    Pertinent History Pt was diagnosed with Parkinson's since July 2015.   Pt reports decline in strength in legs, walking, feels "wobble".  Reports feeling fuzzy at times, has some tremors right side.    Patient Stated Goals Pt would like to be as independent as possible, be more stable.    Currently in Pain? Yes    Pain Score 2     Pain Location Shoulder    Pain Orientation Right    Pain Descriptors / Indicators Aching    Pain Type Chronic pain    Pain Onset More than a month ago    Pain Frequency Intermittent  Maximal Daily Exercises performed in ADAPTEDVERSION: Patient seen for initial instruction of LSVT BIG exercises: LSVT Daily Session Maximal Daily Exercises: Sustained movements are designed to rescale the amplitude of movement output for generalization to daily functional activities. Performed as follows for 1 set of 10 repetitions each: Multi directional sustained movements- 1) Floor to ceiling, 2) Side to side. Multi directional Repetitive movements performed in standing and are designed to provide retraining effort needed for sustained muscle activation in tasks Performed as follows: 3) Step and reach forward,  4) Step and Reach Backwards, 5) Step and reach sideways, 6) Rock and reach forward/backward, 7) Rock and reach sideways(eliminated this date due to back pain)  Occasional min assist for balance with exercises in standing, tactile cues for arm movements. Exercises were performed in an ADAPTED version secondary to decreased balance.  Reaching to behind the head with each arm, cues for BIG hand movements Sit to stand from mat table on lowest setting with cues for weight shift, technique andmin assistfor 10 reps for 1 set, patient able to demonstrate improved weight shift forwards Functional mobility with use of rollator forone trial of 200 feet with min guard and cues for amplitude of movement.(pt fatigued this date and could not ambulate further)  LE leg exercises for full leg extensions, ankle pumps, ankle circles forwards/backwards for 1 set of 10 reps each.  Therapist demo and cues provided for proper form technique and size of movement.    7 hand exercises to help promote handwriting, dexterity and flexibility. 1) Fisting with arm then arm extension with fingers extended with BIG hand movements 2) oppositional movements of the thumb to each digit 3) wrist flex/ext with elbows extended 4) supination/pronation 5) tendon gliding with hook fist and then finger extension 6) tendon gliding with tabletop movement of fingers with MP flexion, PIP and DIP extension 7) MP flexion, PIP flexion, DIP extension Performed with therapist demo and occasional cues for form  Fine motor control tasks with use of 1/2 inch pegs to pick up and place into grid, cues for prehension patterns for tip to tip.  Multiple rows completed.    Response to tx: Pt reports she was not able to complete her exercises over the weekend, she reports some back pain and shoulder pain returned which may also be due to not performing exercises.  Patient required cues this date for exercises for proper form and  technique, continues to require the use of a chair to perform in adapted version.  Decreased functional mobility today, encouraged patient to re engage in tasks.  She does report she was able to see some improvements last week.  Sit to stand today with occasional min assist if mat is at lowest setting (chair height).  Continue to work towards goals in plan of care to improve balance, ROM, functional mobility and improved independence in daily tasks.  Husband present during session.                      OT Education - 06/27/20 1729    Education Details maximal daily exercises, functional mobility, amplitude of steps    Person(s) Educated Patient;Child(ren)    Methods Explanation;Demonstration;Verbal cues    Comprehension Verbalized understanding;Returned demonstration               OT Long Term Goals - 06/15/20 1418      OT LONG TERM GOAL #1   Title Patient will improve gait speed and endurance to be able to ambulate  600 feet in 6 minutes to negotiate around the home and community safely in 4 weeks.    Baseline 425 feet at eval    Time 4    Period Weeks    Status New    Target Date 07/19/20      OT LONG TERM GOAL #2   Title Patient will complete HEP for maximal daily exercises with modified independence in 4 weeks.    Baseline no current program    Time 4    Period Weeks    Status New    Target Date 07/19/20      OT LONG TERM GOAL #3   Title Patient will transfer from to sit to stand without the use of arms safely and independently from a variety of chairs/surfaces in 4 weeks.    Baseline unable at evaluation    Time 4    Period Weeks    Status New    Target Date 07/19/20      OT LONG TERM GOAL #4   Title Pt will complete toilet transfers with modified independence    Baseline occasional assist from husband    Time 4    Period Weeks    Status New    Target Date 07/19/20      OT LONG TERM GOAL #5   Title Pt will improve ROM in UE to complete hair care  with occasional min assist    Baseline moderate to max assist at eval    Time 4    Period Weeks    Status New    Target Date 07/19/20      Long Term Additional Goals   Additional Long Term Goals Yes      OT LONG TERM GOAL #6   Title Patient will don and doff socks with modified independence    Baseline difficulty at eval    Time 4    Period Weeks    Target Date 07/19/20                 Plan - 06/27/20 1729    Clinical Impression Statement Pt reports she was not able to complete her exercises over the weekend, she reports some back pain and shoulder pain returned which may also be due to not performing exercises.  Patient required cues this date for exercises for proper form and technique, continues to require the use of a chair to perform in adapted version.  Decreased functional mobility today, encouraged patient to re engage in tasks.  She does report she was able to see some improvements last week.  Sit to stand today with occasional min assist if mat is at lowest setting (chair height).  Continue to work towards goals in plan of care to improve balance, ROM, functional mobility and improved independence in daily tasks.  Husband present during session.    OT Occupational Profile and History Detailed Assessment- Review of Records and additional review of physical, cognitive, psychosocial history related to current functional performance    Occupational performance deficits (Please refer to evaluation for details): ADL's;IADL's;Leisure    Body Structure / Function / Physical Skills ADL;Dexterity;Flexibility;ROM;Strength;Balance;Coordination;FMC;IADL;Body mechanics;Endurance;Gait;Pain;UE functional use;GMC;Mobility    Geophysicist/field seismologist;Sequencing    Psychosocial Skills Environmental  Adaptations;Habits;Routines and Behaviors    Rehab Potential Good    Clinical Decision Making Several treatment options, min-mod task modification necessary    Comorbidities Affecting  Occupational Performance: May have comorbidities impacting occupational performance    Modification or Assistance to Complete Evaluation  No  modification of tasks or assist necessary to complete eval    OT Frequency 4x / week    OT Duration 4 weeks    OT Treatment/Interventions Self-care/ADL training;Cryotherapy;Therapeutic exercise;DME and/or AE instruction;Functional Mobility Training;Balance training;Neuromuscular education;Gait Training;Manual Therapy;Moist Heat;Contrast Bath;Passive range of motion;Therapeutic activities;Patient/family education    Consulted and Agree with Plan of Care Patient;Family member/caregiver    Family Member Consulted husband           Patient will benefit from skilled therapeutic intervention in order to improve the following deficits and impairments:   Body Structure / Function / Physical Skills: ADL,Dexterity,Flexibility,ROM,Strength,Balance,Coordination,FMC,IADL,Body mechanics,Endurance,Gait,Pain,UE functional use,GMC,Mobility Cognitive Skills: Attention,Memory,Sequencing Psychosocial Skills: Environmental  Adaptations,Habits,Routines and Behaviors   Visit Diagnosis: Muscle weakness (generalized)  Other lack of coordination  Parkinson's disease ([place])    Problem List Patient Active Problem List   Diagnosis Date Noted  . Chronic anemia 08/09/2018  . External hemorrhoids   . Hypertrophy of anal papillae   . Loss of weight   . Diverticulosis of large intestine without diverticulitis   . Dehydration 07/15/2018  . Parkinson's disease ([place]) 04/18/2018  . Hyperlipidemia, mixed 10/27/2016  . Benign essential hypertension 06/11/2016  . 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 Primary cancer of lower-inner quadrant of left female breast ([place]) 12/04/2014  . Acquired spondylolisthesis 10/11/2013  . B12 deficiency 10/02/2013  . Lumbar disc disease 09/07/2013   Achilles Dunk, OTR/L, CLT  Izaha Shughart 06/27/2020,  5:47 PM  [place] MAIN [facility] SERVICES [address] [place], [place], [address] Phone: [phone]   Fax:  [phone]  Name: XITLALI KASTENS MRN: [id number] Date of Birth: October 17, 1950

## 2020-06-27 NOTE — Therapy (Signed)
[place] MAIN [facility] SERVICES [address] [place], [place], [address] Phone: [phone]   Fax:  [phone]  Speech Language Pathology Treatment  Patient Details  Name: Kathy Bean MRN: [id number] Date of Birth: 1950-02-27 Referring Provider (SLP): Dr. Jennings Books   Encounter Date: 06/26/2020   End of Session - 06/27/20 0806    Visit Number 4    Number of Visits 17    Date for SLP Re-Evaluation 09/08/20    Authorization Type Medicare    Authorization - Visit Number 4    Progress Note Due on Visit 10    SLP Start Time 1400    SLP Stop Time  6269   Patient needed to use restroom at 14:55   SLP Time Calculation (min) 55 min    Activity Tolerance Patient tolerated treatment well           Past Medical History:  Diagnosis Date  . Breast cancer ([place]) 11/2014   left  . Cancer [facility]) 2016   left breast  . Depression   . Dysrhythmia    heart racing  . Family history of adverse reaction to anesthesia    brother had difficulty going under anesthseia  . Headache    hx of migraines   period induced  . Hypertension   . Osteoarthritis   . Parkinson disease ([place])   . Personal history of radiation therapy     Past Surgical History:  Procedure Laterality Date  . BREAST BIOPSY Left 10/2014   positive  . BREAST LUMPECTOMY Left 2016   invasive mammary  . COLONOSCOPY Left 07/17/2018   Procedure: COLONOSCOPY;  Surgeon: Virgel Manifold, MD;  Location: [facility] ENDOSCOPY;  Service: Endoscopy;  Laterality: Left;  . DILATION AND CURETTAGE OF UTERUS    . ESOPHAGOGASTRODUODENOSCOPY Left 07/17/2018   Procedure: ESOPHAGOGASTRODUODENOSCOPY (EGD);  Surgeon: Virgel Manifold, MD;  Location: [facility] ENDOSCOPY;  Service: Endoscopy;  Laterality: Left;  . Laminectomy Posterior Cervicle Decomp W/Facetectomy & Foraminotomy     [id number] x 3...Marland KitchenMarland KitchenEXCISION CENTRAL LEFT [place] HERNIATION L 2-3; Surgeon: Loni Dolly, MD; Location: [place]; Service:  Orthopedics; Laterality: Left  . Laminectomy Posterior Lumbar Facetectomy & Foraminotomy W/Decomp     BILATERAL LUMBAR DECOMPRESSION L2-3, L3-4, L4-5; Surgeon: Loni Dolly, MD; Location: [place]; Service: Orthopedics; Laterality: Bilateral;  . PARTIAL MASTECTOMY WITH NEEDLE LOCALIZATION Left 11/29/2014   Procedure: PARTIAL MASTECTOMY WITH NEEDLE LOCALIZATION;  Surgeon: Leonie Green, MD;  Location: [facility] ORS;  Service: General;  Laterality: Left;  . SENTINEL NODE BIOPSY Left 11/29/2014   Procedure: SENTINEL NODE BIOPSY;  Surgeon: Leonie Green, MD;  Location: [facility] ORS;  Service: General;  Laterality: Left;    There were no vitals filed for this visit.   Subjective Assessment - 06/27/20 0800    Subjective Reporting using loud voice with her daughter.    Currently in Pain? No/denies                 ADULT SLP TREATMENT - 06/27/20 0801      General Information   Behavior/Cognition Alert;Cooperative;Pleasant mood    HPI Kathy Bean is a 70 y.o. female referred by Dr. 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 Per chart review pt with hypophonia, dysphagia      Treatment Provided   Treatment provided Cognitive-Linquistic      Pain Assessment   Pain Assessment No/denies pain      Cognitive-Linquistic Treatment   Treatment focused on Dysarthria;Patient/family/caregiver education  Skilled Treatment LSVT Daily tasks: Loud /a/ averaged 84 dB, duration ~8.5 seconds, patient required min-moderate cues and modeling for shaping of vocal quality and breath support. Max F0 for pitch glides was 374 Hz (85 dB avg), min F0 for low glides was 186 Hz (84 dB avg); patient required mod cues for slowing rate; SLP modeled pausing between reps. Functional phrases averaged 86 dB; finalized with assistance from SLP (pt had written many phrases she doesn't actually say) Redirection to task was necessary throughout this treatment task. Phrase level reading tasks averaged 85  dB; spontaneous responses and "asides" dipped in intensity and quality without pt awareness.      Assessment / Recommendations / Plan   Plan Continue with current plan of care      Progression Toward Goals   Progression toward goals Progressing toward goals            SLP Education - 06/27/20 0806    Education Details being loud helps improve other parameters (vocal quality, rate, intelligibility)    Person(s) Educated Patient    Methods Explanation    Comprehension Verbalized understanding            SLP Short Term Goals - 06/10/20 1750      SLP SHORT TERM GOAL #1   Title The patient will complete Daily Tasks (Maximum duration "ah", High/Lows, and Functional Phrases) at average loudness >/= 85 dB and with loud, good quality voice with min cues.    Time 10   sessions   Status New      SLP SHORT TERM GOAL #2   Title The patient will complete Hierarchal Speech Loudness reading drills (words/phrases, sentences) at average >/= 75 dB and with loud, good quality voice with min cues.    Time 10   sessions   Period Weeks    Status New      SLP SHORT TERM GOAL #3   Title The patient will participate in 5-8 minutes conversation, maintaining average loudness of 75 dB and loud, good quality voice with min cues.    Time 10   sessions   Status New            SLP Long Term Goals - 06/11/20 1408      SLP LONG TERM GOAL #1   Title The patient will complete Daily Tasks (Maximum duration "ah", High/Lows, and Functional Phrases) at average loudness >/= 85 dB and with loud, good quality voice.    Time 4   or 17 sessions, for all LTGs   Period Weeks    Status New    Target Date 07/19/20      SLP LONG TERM GOAL #2   Title The patient will complete Hierarchal Speech Loudness reading drills (words/phrases, sentences, and paragraph) at average >/= 75 dB and with loud, good quality voice.    Time 4    Period Weeks    Status New    Target Date 07/19/20      SLP LONG TERM GOAL #3    Title The patient will participate in 15-20 minutes conversation, maintaining average loudness of >/= 75 dB and loud, good quality voice.    Time 4    Period Weeks    Status New    Target Date 07/19/20      SLP LONG TERM GOAL #4   Title Patient will report improved communication effectiveness as measured by Communicative Effectiveness Survey.    Baseline 21/32 on 06/10/2020, higher score = "More effective"  Time 4    Period Weeks    Status New    Target Date 07/19/20      SLP LONG TERM GOAL #5   Title Patient will participate in Modified Barium Swallow study for assessment of swallow function if clinically indicated.    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 She also reports intermittent dysphagia with liquids; to be monitored clinically and may consider objective swallowing evaluation if indicated; no overt signs of aspiration were observed as pt consumed water throughout session today. Patient benefitted from clinician model vs verbal instruction to improve vocal quality, pitch, and loudness and demonstrates emerging awareness and ability to self-correct vocal quality in structured tasks, but requires cues in spontaneous conversation. I recommend skilled ST for LSVT-LOUD treatment 4 days a week x4 weeks to improve vocal quality and ability to communicate with family and friends in the community.    Speech Therapy Frequency 4x / week    Duration 4 weeks    Treatment/Interventions Aspiration precaution training;SLP instruction and feedback;Functional tasks;Patient/family education;Cueing hierarchy;Other (comment)   LSVT-LOUD   Potential to Achieve Goals Good    SLP Home Exercise Plan LSVT LOUD homework provided    Consulted and Agree with Plan of Care Patient;Family member/caregiver           Patient  will benefit from skilled therapeutic intervention in order to improve the following deficits and impairments:   Dysarthria and anarthria  Parkinson's disease [facility])    Problem List Patient Active Problem List   Diagnosis Date Noted  . Chronic anemia 08/09/2018  . External hemorrhoids   . Hypertrophy of anal papillae   . Loss of weight   . Diverticulosis of large intestine without diverticulitis   . Dehydration 07/15/2018  . Parkinson's disease ([place]) 04/18/2018  . Hyperlipidemia, mixed 10/27/2016  . Benign essential hypertension 06/11/2016  . 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 Primary cancer of lower-inner quadrant of left female breast ([place]) 12/04/2014  . Acquired spondylolisthesis 10/11/2013  . B12 deficiency 10/02/2013  . Lumbar disc disease 09/07/2013   Deneise Lever, [place], [place] Speech-Language Pathologist   Aliene Altes 06/27/2020, 8:08 AM  [place] MAIN [facility] SERVICES [address] [place], [place], [address] Phone: [phone]   Fax:  [phone]   Name: ALEXIANNA NACHREINER MRN: [id number] Date of Birth: September 01, 1950

## 2020-06-28 ENCOUNTER — Encounter: Payer: Self-pay | Admitting: Occupational Therapy

## 2020-06-28 NOTE — Therapy (Signed)
[place] MAIN [facility] SERVICES [address] [place], [place], [address] Phone: [phone]   Fax:  [phone]  Occupational Therapy Treatment  Patient Details  Name: Kathy Bean MRN: [id number] Date of Birth: 1950/10/29 No data recorded  Encounter Date: 06/25/2020   OT End of Session - 06/28/20 1930    Visit Number 7    Number of Visits 17    Date for OT Re-Evaluation 07/19/20    OT Start Time 1301    OT Stop Time 1400    OT Time Calculation (min) 59 min    Activity Tolerance Patient tolerated treatment well    Behavior During Therapy [facility] for tasks assessed/performed           Past Medical History:  Diagnosis Date  . Breast cancer ([place]) 11/2014   left  . Cancer [facility]) 2016   left breast  . Depression   . Dysrhythmia    heart racing  . Family history of adverse reaction to anesthesia    brother had difficulty going under anesthseia  . Headache    hx of migraines   period induced  . Hypertension   . Osteoarthritis   . Parkinson disease ([place])   . Personal history of radiation therapy     Past Surgical History:  Procedure Laterality Date  . BREAST BIOPSY Left 10/2014   positive  . BREAST LUMPECTOMY Left 2016   invasive mammary  . COLONOSCOPY Left 07/17/2018   Procedure: COLONOSCOPY;  Surgeon: Virgel Manifold, MD;  Location: [facility] ENDOSCOPY;  Service: Endoscopy;  Laterality: Left;  . DILATION AND CURETTAGE OF UTERUS    . ESOPHAGOGASTRODUODENOSCOPY Left 07/17/2018   Procedure: ESOPHAGOGASTRODUODENOSCOPY (EGD);  Surgeon: Virgel Manifold, MD;  Location: [facility] ENDOSCOPY;  Service: Endoscopy;  Laterality: Left;  . Laminectomy Posterior Cervicle Decomp W/Facetectomy & Foraminotomy     [id number] x 3...Marland KitchenMarland KitchenEXCISION CENTRAL LEFT [place] HERNIATION L 2-3; Surgeon: Loni Dolly, MD; Location: [place]; Service: Orthopedics; Laterality: Left  . Laminectomy Posterior Lumbar Facetectomy & Foraminotomy W/Decomp     BILATERAL LUMBAR  DECOMPRESSION L2-3, L3-4, L4-5; Surgeon: Loni Dolly, MD; Location: [place]; Service: Orthopedics; Laterality: Bilateral;  . PARTIAL MASTECTOMY WITH NEEDLE LOCALIZATION Left 11/29/2014   Procedure: PARTIAL MASTECTOMY WITH NEEDLE LOCALIZATION;  Surgeon: Leonie Green, MD;  Location: [facility] ORS;  Service: General;  Laterality: Left;  . SENTINEL NODE BIOPSY Left 11/29/2014   Procedure: SENTINEL NODE BIOPSY;  Surgeon: Leonie Green, MD;  Location: [facility] ORS;  Service: General;  Laterality: Left;    There were no vitals filed for this visit.   Subjective Assessment - 06/28/20 1928    Subjective  Pt reports she is feeling better than yesterday, no complaints    Pertinent History Pt was diagnosed with Parkinson's since July 2015.   Pt reports decline in strength in legs, walking, feels "wobble".  Reports feeling fuzzy at times, has some tremors right side.    Patient Stated Goals Pt would like to be as independent as possible, be more stable.    Currently in Pain? No/denies    Pain Score 0-No pain           Maximal Daily Exercises performed in ADAPTEDVERSION: Patient seen for initial instruction of LSVT BIG exercises: LSVT Daily Session Maximal Daily Exercises: Sustained movements are designed to rescale the amplitude of movement output for generalization to daily functional activities. Performed as follows for 1 set of 10 repetitions each: Multi directional sustained movements- 1)  Floor to ceiling, 2) Side to side. Multi directional Repetitive movements performed in standing and are designed to provide retraining effort needed for sustained muscle activation in tasks Performed as follows: 3) Step and reach forward, 4) Step and Reach Backwards, 5) Step and reach sideways, 6) Rock and reach forward/backward, 7) Rock and reach sideways(eliminated due to back pain)  Occasional min assist for balance with exercises in standing, tactile cues for arm movements. Exercises were performed  in an ADAPTED version secondary to decreased balance.  Functional component tasks: Reaching to behind the head with each arm, cues for BIG hand movements Sit to stand from mat table on lowest setting with cues for weight shift, technique andmin assistfor 10 reps for 1 set, patient able to demonstrate improved weight shift forwards Manipulation of coins from flat tabletop to place in resistive bank with each hand, cues for translatory movements of the hand and using the hand for storage.    Functional mobility with use of rollator forone trial of 442feet with min guard to supervision and cues for amplitude of movement.  LE leg exercises for full leg extensions, ankle pumps, ankle circles forwards/backwards for 1 set of 10 reps each. Therapist demo and cues provided for proper form technique and size of movement.   7 hand exercises to help promote handwriting, dexterity and flexibility. 1) Fisting with arm then arm extension with fingers extended with BIG hand movements 2) oppositional movements of the thumb to each digit 3) wrist flex/ext with elbows extended 4) supination/pronation 5) tendon gliding with hook fist and then finger extension 6) tendon gliding with tabletop movement of fingers with MP flexion, PIP and DIP extension 7) MP flexion, PIP flexion, DIP extension Performed with therapist demo and occasional cues for form  Response to tx:  Pt improved since last date after implementing exercises daily, decreased back pain this date but still not performing twisting exercise since this appears to exacerbate the problem.  Patient continues to improve with sit to stand transitions as well as functional mobility with cues for amplitude of gait.  Speed improving with fine motor coordination tasks and able to reach towards back of her head for hair care.  Continue to work towards goals in plan of care to improve independence in daily tasks.                      OT Education - 06/28/20 1929    Education Details maximal daily exercises, functional mobility, amplitude of steps    Person(s) Educated Patient;Child(ren)    Methods Explanation;Demonstration;Verbal cues    Comprehension Verbalized understanding;Returned demonstration               OT Long Term Goals - 06/15/20 1418      OT LONG TERM GOAL #1   Title Patient will improve gait speed and endurance to be able to ambulate 600 feet in 6 minutes to negotiate around the home and community safely in 4 weeks.    Baseline 425 feet at eval    Time 4    Period Weeks    Status New    Target Date 07/19/20      OT LONG TERM GOAL #2   Title Patient will complete HEP for maximal daily exercises with modified independence in 4 weeks.    Baseline no current program    Time 4    Period Weeks    Status New    Target Date 07/19/20  OT LONG TERM GOAL #3   Title Patient will transfer from to sit to stand without the use of arms safely and independently from a variety of chairs/surfaces in 4 weeks.    Baseline unable at evaluation    Time 4    Period Weeks    Status New    Target Date 07/19/20      OT LONG TERM GOAL #4   Title Pt will complete toilet transfers with modified independence    Baseline occasional assist from husband    Time 4    Period Weeks    Status New    Target Date 07/19/20      OT LONG TERM GOAL #5   Title Pt will improve ROM in UE to complete hair care with occasional min assist    Baseline moderate to max assist at eval    Time 4    Period Weeks    Status New    Target Date 07/19/20      Long Term Additional Goals   Additional Long Term Goals Yes      OT LONG TERM GOAL #6   Title Patient will don and doff socks with modified independence    Baseline difficulty at eval    Time 4    Period Weeks    Target Date 07/19/20                 Plan - 06/28/20 1930    Clinical Impression Statement Pt improved since  last date after implementing exercises daily, decreased back pain this date but still not performing twisting exercise since this appears to exacerbate the problem.  Patient continues to improve with sit to stand transitions as well as functional mobility with cues for amplitude of gait.  Speed improving with fine motor coordination tasks and able to reach towards back of her head for hair care.  Continue to work towards goals in plan of care to improve independence in daily tasks.    OT Occupational Profile and History Detailed Assessment- Review of Records and additional review of physical, cognitive, psychosocial history related to current functional performance    Occupational performance deficits (Please refer to evaluation for details): ADL's;IADL's;Leisure    Body Structure / Function / Physical Skills ADL;Dexterity;Flexibility;ROM;Strength;Balance;Coordination;FMC;IADL;Body mechanics;Endurance;Gait;Pain;UE functional use;GMC;Mobility    Geophysicist/field seismologist;Sequencing    Psychosocial Skills Environmental  Adaptations;Habits;Routines and Behaviors    Rehab Potential Good    Clinical Decision Making Several treatment options, min-mod task modification necessary    Comorbidities Affecting Occupational Performance: May have comorbidities impacting occupational performance    Modification or Assistance to Complete Evaluation  No modification of tasks or assist necessary to complete eval    OT Frequency 4x / week    OT Duration 4 weeks    OT Treatment/Interventions Self-care/ADL training;Cryotherapy;Therapeutic exercise;DME and/or AE instruction;Functional Mobility Training;Balance training;Neuromuscular education;Gait Training;Manual Therapy;Moist Heat;Contrast Bath;Passive range of motion;Therapeutic activities;Patient/family education    Consulted and Agree with Plan of Care Patient;Family member/caregiver    Family Member Consulted husband           Patient will benefit from  skilled therapeutic intervention in order to improve the following deficits and impairments:   Body Structure / Function / Physical Skills: ADL,Dexterity,Flexibility,ROM,Strength,Balance,Coordination,FMC,IADL,Body mechanics,Endurance,Gait,Pain,UE functional use,GMC,Mobility Cognitive Skills: Attention,Memory,Sequencing Psychosocial Skills: Environmental  Adaptations,Habits,Routines and Behaviors   Visit Diagnosis: Unsteadiness on feet  Muscle weakness (generalized)  Other lack of coordination  Parkinson's disease ([place])  Difficulty in walking, not elsewhere classified    Problem  List Patient Active Problem List   Diagnosis Date Noted  . Chronic anemia 08/09/2018  . External hemorrhoids   . Hypertrophy of anal papillae   . Loss of weight   . Diverticulosis of large intestine without diverticulitis   . Dehydration 07/15/2018  . Parkinson's disease ([place]) 04/18/2018  . Hyperlipidemia, mixed 10/27/2016  . Benign essential hypertension 06/11/2016  . Malignant neoplasm of central portion of left female breast ([place]) 04/16/2015  . Primary cancer of lower-inner quadrant of left female breast ([place]) 12/04/2014  . Acquired spondylolisthesis 10/11/2013  . B12 deficiency 10/02/2013  . Lumbar disc disease 09/07/2013   Achilles Dunk, OTR/L, CLT  Carrianne Hyun 06/28/2020, 7:41 PM  [place] MAIN [facility] SERVICES [address] [place], [place], [address] Phone: [phone]   Fax:  [phone]  Name: Kathy Bean MRN: [id number] Date of Birth: [birth year]

## 2020-06-28 NOTE — Therapy (Signed)
[place] MAIN [facility] SERVICES [address] [place], [place], [address] Phone: [phone]   Fax:  [phone]  Occupational Therapy Treatment  Patient Details  Name: Kathy Bean MRN: [id number] Date of Birth: October 01, 1950 No data recorded  Encounter Date: 06/27/2020   OT End of Session - 06/28/20 2141    Visit Number 9    Number of Visits 17    Date for OT Re-Evaluation 07/19/20    OT Start Time 1302    OT Stop Time 1400    OT Time Calculation (min) 58 min    Activity Tolerance Patient tolerated treatment well    Behavior During Therapy [facility] for tasks assessed/performed           Past Medical History:  Diagnosis Date  . Breast cancer ([place]) 11/2014   left  . Cancer [facility]) 2016   left breast  . Depression   . Dysrhythmia    heart racing  . Family history of adverse reaction to anesthesia    brother had difficulty going under anesthseia  . Headache    hx of migraines   period induced  . Hypertension   . Osteoarthritis   . Parkinson disease ([place])   . Personal history of radiation therapy     Past Surgical History:  Procedure Laterality Date  . BREAST BIOPSY Left 10/2014   positive  . BREAST LUMPECTOMY Left 2016   invasive mammary  . COLONOSCOPY Left 07/17/2018   Procedure: COLONOSCOPY;  Surgeon: Virgel Manifold, MD;  Location: [facility] ENDOSCOPY;  Service: Endoscopy;  Laterality: Left;  . DILATION AND CURETTAGE OF UTERUS    . ESOPHAGOGASTRODUODENOSCOPY Left 07/17/2018   Procedure: ESOPHAGOGASTRODUODENOSCOPY (EGD);  Surgeon: Virgel Manifold, MD;  Location: [facility] ENDOSCOPY;  Service: Endoscopy;  Laterality: Left;  . Laminectomy Posterior Cervicle Decomp W/Facetectomy & Foraminotomy     [id number] x 3...Marland KitchenMarland KitchenEXCISION CENTRAL LEFT [place] HERNIATION L 2-3; Surgeon: Loni Dolly, MD; Location: [place]; Service: Orthopedics; Laterality: Left  . Laminectomy Posterior Lumbar Facetectomy & Foraminotomy W/Decomp     BILATERAL LUMBAR  DECOMPRESSION L2-3, L3-4, L4-5; Surgeon: Loni Dolly, MD; Location: [place]; Service: Orthopedics; Laterality: Bilateral;  . PARTIAL MASTECTOMY WITH NEEDLE LOCALIZATION Left 11/29/2014   Procedure: PARTIAL MASTECTOMY WITH NEEDLE LOCALIZATION;  Surgeon: Leonie Green, MD;  Location: [facility] ORS;  Service: General;  Laterality: Left;  . SENTINEL NODE BIOPSY Left 11/29/2014   Procedure: SENTINEL NODE BIOPSY;  Surgeon: Leonie Green, MD;  Location: [facility] ORS;  Service: General;  Laterality: Left;    There were no vitals filed for this visit.   Subjective Assessment - 06/28/20 2138    Subjective  Pt reports she knows she will need to perform exercises two times a day over the weekend.    Pertinent History Pt was diagnosed with Parkinson's since July 2015.   Pt reports decline in strength in legs, walking, feels "wobble".  Reports feeling fuzzy at times, has some tremors right side.    Patient Stated Goals Pt would like to be as independent as possible, be more stable.    Currently in Pain? No/denies    Pain Score 0-No pain           Maximal Daily Exercises performed in ADAPTEDVERSION: Patient seen for initial instruction of LSVT BIG exercises: LSVT Daily Session Maximal Daily Exercises: Sustained movements are designed to rescale the amplitude of movement output for generalization to daily functional activities. Performed as follows for 1 set of 10  repetitions each: Multi directional sustained movements- 1) Floor to ceiling, 2) Side to side. Multi directional Repetitive movements performed in standing and are designed to provide retraining effort needed for sustained muscle activation in tasks Performed as follows: 3) Step and reach forward, 4) Step and Reach Backwards, 5) Step and reach sideways, 6) Rock and reach forward/backward, 7) Rock and reach sideways(eliminated due to back pain)  Min guard for exercises this date, continued cues for increasing movements in BIG fashion,  continues to work on calibration of movement patterns. Added alternating sides with side to side exercise in sitting.  Attempted alternating sides with stepping forward however patient had difficulty with movement patterns.    Functional component tasks: Reaching to behind the head with each arm, cues for BIG hand movements Sit to stand from mat table on lowest setting with cues for weight shift, technique andmin assistfor 10 reps for 1 set, patient able to demonstrate improved weight shift forwards Manipulation of coins from flat tabletop to place in resistive bank with each hand, cues for translatory movements of the hand and using the hand for storage.  LE leg exercises for full leg extensions, ankle pumps, ankle circles forwards/backwards for 1 set of 10 reps each. Therapist demo and cues provided for proper form technique and size of movement.   7 hand exercises to help promote handwriting, dexterity and flexibility. 1) Fisting with arm then arm extension with fingers extended with BIG hand movements 2) oppositional movements of the thumb to each digit 3) wrist flex/ext with elbows extended 4) supination/pronation 5) tendon gliding with hook fist and then finger extension 6) tendon gliding with tabletop movement of fingers with MP flexion, PIP and DIP extension 7) MP flexion, PIP flexion, DIP extension Performed with therapist demo and occasional cues for form  Functional mobility with rollator for one trial of 225 feet with min guard and cues.  Response to tx: Patient demonstrating improved ROM with reaching down towards floor and up to the ceiling, more consistent with demonstrating BIG hand movements during exercises.  While pt will need to continue with adapted version of exercises at home, we will try to upgrade exercises to standard version when in the clinic with therapist assist next week.  Improved amplitude of steps this date but fatigued quickly and required  sitting.  Continue OT to increase independence in ADL and IADL tasks.                     OT Education - 06/28/20 2141    Education Details maximal daily exercises, functional mobility, amplitude of steps    Person(s) Educated Patient;Spouse    Methods Explanation;Demonstration;Verbal cues    Comprehension Verbalized understanding;Returned demonstration               OT Long Term Goals - 06/15/20 1418      OT LONG TERM GOAL #1   Title Patient will improve gait speed and endurance to be able to ambulate 600 feet in 6 minutes to negotiate around the home and community safely in 4 weeks.    Baseline 425 feet at eval    Time 4    Period Weeks    Status New    Target Date 07/19/20      OT LONG TERM GOAL #2   Title Patient will complete HEP for maximal daily exercises with modified independence in 4 weeks.    Baseline no current program    Time 4    Period Weeks  Status New    Target Date 07/19/20      OT LONG TERM GOAL #3   Title Patient will transfer from to sit to stand without the use of arms safely and independently from a variety of chairs/surfaces in 4 weeks.    Baseline unable at evaluation    Time 4    Period Weeks    Status New    Target Date 07/19/20      OT LONG TERM GOAL #4   Title Pt will complete toilet transfers with modified independence    Baseline occasional assist from husband    Time 4    Period Weeks    Status New    Target Date 07/19/20      OT LONG TERM GOAL #5   Title Pt will improve ROM in UE to complete hair care with occasional min assist    Baseline moderate to max assist at eval    Time 4    Period Weeks    Status New    Target Date 07/19/20      Long Term Additional Goals   Additional Long Term Goals Yes      OT LONG TERM GOAL #6   Title Patient will don and doff socks with modified independence    Baseline difficulty at eval    Time 4    Period Weeks    Target Date 07/19/20                 Plan -  06/28/20 2141    Clinical Impression Statement Patient demonstrating improved ROM with reaching down towards floor and up to the ceiling, more consistent with demonstrating BIG hand movements during exercises.  While pt will need to continue with adapted version of exercises at home, we will try to upgrade exercises to standard version when in the clinic with therapist assist next week.  Improved amplitude of steps this date but fatigued quickly and required sitting.  Continue OT to increase independence in ADL and IADL tasks.    OT Occupational Profile and History Detailed Assessment- Review of Records and additional review of physical, cognitive, psychosocial history related to current functional performance    Occupational performance deficits (Please refer to evaluation for details): ADL's;IADL's;Leisure    Body Structure / Function / Physical Skills ADL;Dexterity;Flexibility;ROM;Strength;Balance;Coordination;FMC;IADL;Body mechanics;Endurance;Gait;Pain;UE functional use;GMC;Mobility    Geophysicist/field seismologist;Sequencing    Psychosocial Skills Environmental  Adaptations;Habits;Routines and Behaviors    Rehab Potential Good    Clinical Decision Making Several treatment options, min-mod task modification necessary    Comorbidities Affecting Occupational Performance: May have comorbidities impacting occupational performance    Modification or Assistance to Complete Evaluation  No modification of tasks or assist necessary to complete eval    OT Frequency 4x / week    OT Duration 4 weeks    OT Treatment/Interventions Self-care/ADL training;Cryotherapy;Therapeutic exercise;DME and/or AE instruction;Functional Mobility Training;Balance training;Neuromuscular education;Gait Training;Manual Therapy;Moist Heat;Contrast Bath;Passive range of motion;Therapeutic activities;Patient/family education    Consulted and Agree with Plan of Care Patient;Family member/caregiver    Family Member Consulted  husband           Patient will benefit from skilled therapeutic intervention in order to improve the following deficits and impairments:   Body Structure / Function / Physical Skills: ADL,Dexterity,Flexibility,ROM,Strength,Balance,Coordination,FMC,IADL,Body mechanics,Endurance,Gait,Pain,UE functional use,GMC,Mobility Cognitive Skills: Attention,Memory,Sequencing Psychosocial Skills: Environmental  Adaptations,Habits,Routines and Behaviors   Visit Diagnosis: Unsteadiness on feet  Muscle weakness (generalized)  Other lack of coordination  Parkinson's disease ([place])  Difficulty in  walking, not elsewhere classified    Problem List Patient Active Problem List   Diagnosis Date Noted  . Chronic anemia 08/09/2018  . External hemorrhoids   . Hypertrophy of anal papillae   . Loss of weight   . Diverticulosis of large intestine without diverticulitis   . Dehydration 07/15/2018  . Parkinson's disease ([place]) 04/18/2018  . Hyperlipidemia, mixed 10/27/2016  . Benign essential hypertension 06/11/2016  . Malignant neoplasm of central portion of left female breast ([place]) 04/16/2015  . Primary cancer of lower-inner quadrant of left female breast ([place]) 12/04/2014  . Acquired spondylolisthesis 10/11/2013  . B12 deficiency 10/02/2013  . Lumbar disc disease 09/07/2013   Kathy Bean, OTR/L, CLT  Kathy Bean 06/28/2020, 9:54 PM  [place] MAIN [facility] SERVICES [address] [place], [place], [address] Phone: [phone]   Fax:  [phone]  Name: Kathy Bean MRN: [id number] Date of Birth: 02-15-1950

## 2020-06-28 NOTE — Therapy (Signed)
[name] [facility] SERVICES [address] [place], [place], [address] Phone: [phone]   Fax:  [phone]  Occupational Therapy Treatment  Patient Details  Name: Kathy Bean MRN: [id number] Date of Birth: 04/09/1950 No data recorded  Encounter Date: 06/26/2020   OT End of Session - 06/28/20 1945    Visit Number 8    Number of Visits 17    Date for OT Re-Evaluation 07/19/20    OT Start Time 1300    OT Stop Time 1400    OT Time Calculation (min) 60 min    Activity Tolerance Patient tolerated treatment well    Behavior During Therapy [facility] for tasks assessed/performed           Past Medical History:  Diagnosis Date  . Breast cancer ([place]) 11/2014   left  . Cancer [facility]) 2016   left breast  . Depression   . Dysrhythmia    heart racing  . Family history of adverse reaction to anesthesia    brother had difficulty going under anesthseia  . Headache    hx of migraines   period induced  . Hypertension   . Osteoarthritis   . Parkinson disease ([place])   . Personal history of radiation therapy     Past Surgical History:  Procedure Laterality Date  . BREAST BIOPSY Left 10/2014   positive  . BREAST LUMPECTOMY Left 2016   invasive mammary  . COLONOSCOPY Left 07/17/2018   Procedure: COLONOSCOPY;  Surgeon: Virgel Manifold, MD;  Location: [facility] ENDOSCOPY;  Service: Endoscopy;  Laterality: Left;  . DILATION AND CURETTAGE OF UTERUS    . ESOPHAGOGASTRODUODENOSCOPY Left 07/17/2018   Procedure: ESOPHAGOGASTRODUODENOSCOPY (EGD);  Surgeon: Virgel Manifold, MD;  Location: [facility] ENDOSCOPY;  Service: Endoscopy;  Laterality: Left;  . Laminectomy Posterior Cervicle Decomp W/Facetectomy & Foraminotomy     [id number] x 3...Marland KitchenMarland KitchenEXCISION CENTRAL LEFT [place] HERNIATION L 2-3; Surgeon: Loni Dolly, MD; Location: [place]; Service: Orthopedics; Laterality: Left  . Laminectomy Posterior Lumbar Facetectomy & Foraminotomy W/Decomp     BILATERAL LUMBAR  DECOMPRESSION L2-3, L3-4, L4-5; Surgeon: Loni Dolly, MD; Location: [place]; Service: Orthopedics; Laterality: Bilateral;  . PARTIAL MASTECTOMY WITH NEEDLE LOCALIZATION Left 11/29/2014   Procedure: PARTIAL MASTECTOMY WITH NEEDLE LOCALIZATION;  Surgeon: Leonie Green, MD;  Location: [facility] ORS;  Service: General;  Laterality: Left;  . SENTINEL NODE BIOPSY Left 11/29/2014   Procedure: SENTINEL NODE BIOPSY;  Surgeon: Leonie Green, MD;  Location: [facility] ORS;  Service: General;  Laterality: Left;    There were no vitals filed for this visit.   Subjective Assessment - 06/28/20 1943    Subjective  Pt's daughter with her today, husband working.  No pain this date.    Pertinent History Pt was diagnosed with Parkinson's since July 2015.   Pt reports decline in strength in legs, walking, feels "wobble".  Reports feeling fuzzy at times, has some tremors right side.    Patient Stated Goals Pt would like to be as independent as possible, be more stable.    Currently in Pain? No/denies    Pain Score 0-No pain           Maximal Daily Exercises performed in ADAPTEDVERSION: Patient seen for initial instruction of LSVT BIG exercises: LSVT Daily Session Maximal Daily Exercises: Sustained movements are designed to rescale the amplitude of movement output for generalization to daily functional activities. Performed as follows for 1 set of 10 repetitions each: Multi directional sustained  movements- 1) Floor to ceiling, 2) Side to side. Multi directional Repetitive movements performed in standing and are designed to provide retraining effort needed for sustained muscle activation in tasks Performed as follows: 3) Step and reach forward, 4) Step and Reach Backwards, 5) Step and reach sideways, 6) Rock and reach forward/backward, 7) Rock and reach sideways(eliminated due to back pain)  Min guard for exercises this date, continued cues for increasing movements in BIG fashion, continues to work on  calibration of movement patterns.  Functional component tasks: Reaching to behind the head with each arm, cues for BIG hand movements Sit to stand from mat table on lowest setting with cues for weight shift, technique andmin assistfor 10 reps for 1 set, patient able to demonstrate improved weight shift forwards Manipulation of coins from flat tabletop to place in resistive bank with each hand, cues for translatory movements of the hand and using the hand for storage.    Pt had to go to the bathroom twice during session, no functional mobility except with ambulation to SLP tx area, 50 feet with use of rollator.  LE leg exercises for full leg extensions, ankle pumps, ankle circles forwards/backwards for 1 set of 10 reps each. Therapist demo and cues provided for proper form technique and size of movement.   7 hand exercises to help promote handwriting, dexterity and flexibility. 1) Fisting with arm then arm extension with fingers extended with BIG hand movements 2) oppositional movements of the thumb to each digit 3) wrist flex/ext with elbows extended 4) supination/pronation 5) tendon gliding with hook fist and then finger extension 6) tendon gliding with tabletop movement of fingers with MP flexion, PIP and DIP extension 7) MP flexion, PIP flexion, DIP extension Performed with therapist demo and occasional cues for form  Response to tx: Limited functional mobility tasks performed due to patient needing to go to the bathroom twice during session.  Pt continues to benefit from repetition of tasks and working towards improving calibration of movements.  Patient showing signs of having internal feedback when she performs exercise incorrectly, she is able to realize and works to correct motion.  Patient continues to require exercises to be performed in adapted version secondary to balance deficits and safety.  Continue to work towards goals in plan of care to improve balance, ROM,  strength and functional mobility skills at home and in the community.                      OT Education - 06/28/20 1944    Education Details maximal daily exercises, functional mobility, amplitude of steps    Person(s) Educated Patient;Child(ren)    Methods Explanation;Demonstration;Verbal cues    Comprehension Verbalized understanding;Returned demonstration               OT Long Term Goals - 06/15/20 1418      OT LONG TERM GOAL #1   Title Patient will improve gait speed and endurance to be able to ambulate 600 feet in 6 minutes to negotiate around the home and community safely in 4 weeks.    Baseline 425 feet at eval    Time 4    Period Weeks    Status New    Target Date 07/19/20      OT LONG TERM GOAL #2   Title Patient will complete HEP for maximal daily exercises with modified independence in 4 weeks.    Baseline no current program    Time 4  Period Weeks    Status New    Target Date 07/19/20      OT LONG TERM GOAL #3   Title Patient will transfer from to sit to stand without the use of arms safely and independently from a variety of chairs/surfaces in 4 weeks.    Baseline unable at evaluation    Time 4    Period Weeks    Status New    Target Date 07/19/20      OT LONG TERM GOAL #4   Title Pt will complete toilet transfers with modified independence    Baseline occasional assist from husband    Time 4    Period Weeks    Status New    Target Date 07/19/20      OT LONG TERM GOAL #5   Title Pt will improve ROM in UE to complete hair care with occasional min assist    Baseline moderate to max assist at eval    Time 4    Period Weeks    Status New    Target Date 07/19/20      Long Term Additional Goals   Additional Long Term Goals Yes      OT LONG TERM GOAL #6   Title Patient will don and doff socks with modified independence    Baseline difficulty at eval    Time 4    Period Weeks    Target Date 07/19/20                  Plan - 06/28/20 1945    Clinical Impression Statement Limited functional mobility tasks performed due to patient needing to go to the bathroom twice during session.  Pt continues to benefit from repetition of tasks and working towards improving calibration of movements.  Patient showing signs of having internal feedback when she performs exercise incorrectly, she is able to realize and works to correct motion.  Patient continues to require exercises to be performed in adapted version secondary to balance deficits and safety.  Continue to work towards goals in plan of care to improve balance, ROM, strength and functional mobility skills at home and in the community.    OT Occupational Profile and History Detailed Assessment- Review of Records and additional review of physical, cognitive, psychosocial history related to current functional performance    Occupational performance deficits (Please refer to evaluation for details): ADL's;IADL's;Leisure    Body Structure / Function / Physical Skills ADL;Dexterity;Flexibility;ROM;Strength;Balance;Coordination;FMC;IADL;Body mechanics;Endurance;Gait;Pain;UE functional use;GMC;Mobility    Geophysicist/field seismologist;Sequencing    Psychosocial Skills Environmental  Adaptations;Habits;Routines and Behaviors    Rehab Potential Good    Clinical Decision Making Several treatment options, min-mod task modification necessary    Comorbidities Affecting Occupational Performance: May have comorbidities impacting occupational performance    Modification or Assistance to Complete Evaluation  No modification of tasks or assist necessary to complete eval    OT Frequency 4x / week    OT Duration 4 weeks    OT Treatment/Interventions Self-care/ADL training;Cryotherapy;Therapeutic exercise;DME and/or AE instruction;Functional Mobility Training;Balance training;Neuromuscular education;Gait Training;Manual Therapy;Moist Heat;Contrast Bath;Passive range of motion;Therapeutic  activities;Patient/family education    Consulted and Agree with Plan of Care Patient;Family member/caregiver    Family Member Consulted husband           Patient will benefit from skilled therapeutic intervention in order to improve the following deficits and impairments:   Body Structure / Function / Physical Skills: ADL,Dexterity,Flexibility,ROM,Strength,Balance,Coordination,FMC,IADL,Body mechanics,Endurance,Gait,Pain,UE functional use,GMC,Mobility Cognitive Skills: Attention,Memory,Sequencing Psychosocial Skills: Environmental  Adaptations,Habits,Routines  and Behaviors   Visit Diagnosis: Unsteadiness on feet  Other lack of coordination  Muscle weakness (generalized)  Parkinson's disease ([place])  Difficulty in walking, not elsewhere classified    Problem List Patient Active Problem List   Diagnosis Date Noted  . Chronic anemia 08/09/2018  . External hemorrhoids   . Hypertrophy of anal papillae   . Loss of weight   . Diverticulosis of large intestine without diverticulitis   . Dehydration 07/15/2018  . Parkinson's disease ([place]) 04/18/2018  . Hyperlipidemia, mixed 10/27/2016  . Benign essential hypertension 06/11/2016  . Malignant neoplasm of central portion of left female breast ([place]) 04/16/2015  . Primary cancer of lower-inner quadrant of left female breast ([place]) 12/04/2014  . Acquired spondylolisthesis 10/11/2013  . B12 deficiency 10/02/2013  . Lumbar disc disease 09/07/2013   Achilles Dunk, OTR/L, CLT  B[employer] T[employer]or 06/28/2020, 8:37 PM  [place] MAIN [facility] SERVICES [address] [place], [place], [address] Phone: [phone]   Fax:  [phone]  Name: Kathy Bean MRN: [id number] Date of Birth: [birth year]

## 2020-07-01 ENCOUNTER — Ambulatory Visit: Payer: Medicare Other | Admitting: Speech Pathology

## 2020-07-01 ENCOUNTER — Encounter: Payer: Self-pay | Admitting: Occupational Therapy

## 2020-07-01 ENCOUNTER — Other Ambulatory Visit: Payer: Self-pay

## 2020-07-01 ENCOUNTER — Ambulatory Visit: Payer: Medicare Other | Admitting: Occupational Therapy

## 2020-07-01 DIAGNOSIS — M6281 Muscle weakness (generalized): Secondary | ICD-10-CM

## 2020-07-01 DIAGNOSIS — R471 Dysarthria and anarthria: Secondary | ICD-10-CM

## 2020-07-01 DIAGNOSIS — G20 Parkinson's disease: Secondary | ICD-10-CM

## 2020-07-01 DIAGNOSIS — R278 Other lack of coordination: Secondary | ICD-10-CM

## 2020-07-01 NOTE — Therapy (Signed)
[place] MAIN [facility] SERVICES [address] [place], [place], [address] Phone: [phone]   Fax:  [phone]  Speech Language Pathology Treatment  Patient Details  Name: Kathy Bean MRN: [id number] Date of Birth: 12-20-1950 Referring Provider (SLP): Dr. Jennings Books   Encounter Date: 07/01/2020   End of Session - 07/01/20 1654    Visit Number 6    Number of Visits 17    Date for SLP Re-Evaluation 09/08/20    Authorization Type Medicare    Authorization - Visit Number 6    Progress Note Due on Visit 10    SLP Start Time 1400    SLP Stop Time  1500    SLP Time Calculation (min) 60 min    Activity Tolerance Patient tolerated treatment well           Past Medical History:  Diagnosis Date  . Breast cancer ([place]) 11/2014   left  . Cancer [facility]) 2016   left breast  . Depression   . Dysrhythmia    heart racing  . Family history of adverse reaction to anesthesia    brother had difficulty going under anesthseia  . Headache    hx of migraines   period induced  . Hypertension   . Osteoarthritis   . Parkinson disease ([place])   . Personal history of radiation therapy     Past Surgical History:  Procedure Laterality Date  . BREAST BIOPSY Left 10/2014   positive  . BREAST LUMPECTOMY Left 2016   invasive mammary  . COLONOSCOPY Left 07/17/2018   Procedure: COLONOSCOPY;  Surgeon: Virgel Manifold, MD;  Location: [facility] ENDOSCOPY;  Service: Endoscopy;  Laterality: Left;  . DILATION AND CURETTAGE OF UTERUS    . ESOPHAGOGASTRODUODENOSCOPY Left 07/17/2018   Procedure: ESOPHAGOGASTRODUODENOSCOPY (EGD);  Surgeon: Virgel Manifold, MD;  Location: [facility] ENDOSCOPY;  Service: Endoscopy;  Laterality: Left;  . Laminectomy Posterior Cervicle Decomp W/Facetectomy & Foraminotomy     [id number] x 3...Marland KitchenMarland KitchenEXCISION CENTRAL LEFT [place] HERNIATION L 2-3; Surgeon: Loni Dolly, MD; Location: [place]; Service: Orthopedics; Laterality: Left  . Laminectomy  Posterior Lumbar Facetectomy & Foraminotomy W/Decomp     BILATERAL LUMBAR DECOMPRESSION L2-3, L3-4, L4-5; Surgeon: Loni Dolly, MD; Location: [place]; Service: Orthopedics; Laterality: Bilateral;  . PARTIAL MASTECTOMY WITH NEEDLE LOCALIZATION Left 11/29/2014   Procedure: PARTIAL MASTECTOMY WITH NEEDLE LOCALIZATION;  Surgeon: Leonie Green, MD;  Location: [facility] ORS;  Service: General;  Laterality: Left;  . SENTINEL NODE BIOPSY Left 11/29/2014   Procedure: SENTINEL NODE BIOPSY;  Surgeon: Leonie Green, MD;  Location: [facility] ORS;  Service: General;  Laterality: Left;    There were no vitals filed for this visit.   Subjective Assessment - 07/01/20 1619    Subjective Patient reported that she was tired today.    Currently in Pain? No/denies                 ADULT SLP TREATMENT - 07/01/20 1621      General Information   Behavior/Cognition Alert;Cooperative;Pleasant mood    HPI Kathy Bean is a 70 y.o. female referred by Dr. 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 Per chart review pt with hypophonia, dysphagia      Treatment Provided   Treatment provided Cognitive-Linquistic      Pain Assessment   Pain Assessment No/denies pain      Cognitive-Linquistic Treatment   Treatment focused on Dysarthria;Patient/family/caregiver education    Skilled Treatment LSVT Daily tasks: Loud /  a/ averaged 82 dB, duration average 8.2 seconds. Max F0 for pitch glides was 373 Hz (84 dB avg), min F0 for low glides was 184 Hz (85 dB avg); Functional phrases averaged 85 dB. Patient completed phrase level reading tasks averaging 85 dB. Patient initially required minimal to moderate cues and modeling/imitation for breath pacing. Progressed to sentences with cognitive load averaged 85 dB. Patient was able to maintain vocal quality/intensity for short conversational responses and short conversation ~ 10 minutes in duration with occasional min cues.      Assessment / Recommendations  / Plan   Plan Continue with current plan of care      Progression Toward Goals   Progression toward goals Progressing toward goals            SLP Education - 07/01/20 1652    Education Details need breath to be loud    Person(s) Educated Patient    Methods Explanation;Demonstration    Comprehension Returned demonstration;Need further instruction            SLP Short Term Goals - 06/10/20 1750      SLP SHORT TERM GOAL #1   Title The patient will complete Daily Tasks (Maximum duration "ah", High/Lows, and Functional Phrases) at average loudness >/= 85 dB and with loud, good quality voice with min cues.    Time 10   sessions   Status New      SLP SHORT TERM GOAL #2   Title The patient will complete Hierarchal Speech Loudness reading drills (words/phrases, sentences) at average >/= 75 dB and with loud, good quality voice with min cues.    Time 10   sessions   Period Weeks    Status New      SLP SHORT TERM GOAL #3   Title The patient will participate in 5-8 minutes conversation, maintaining average loudness of 75 dB and loud, good quality voice with min cues.    Time 10   sessions   Status New            SLP Long Term Goals - 06/11/20 1408      SLP LONG TERM GOAL #1   Title The patient will complete Daily Tasks (Maximum duration "ah", High/Lows, and Functional Phrases) at average loudness >/= 85 dB and with loud, good quality voice.    Time 4   or 17 sessions, for all LTGs   Period Weeks    Status New    Target Date 07/19/20      SLP LONG TERM GOAL #2   Title The patient will complete Hierarchal Speech Loudness reading drills (words/phrases, sentences, and paragraph) at average >/= 75 dB and with loud, good quality voice.    Time 4    Period Weeks    Status New    Target Date 07/19/20      SLP LONG TERM GOAL #3   Title The patient will participate in 15-20 minutes conversation, maintaining average loudness of >/= 75 dB and loud, good quality voice.    Time 4     Period Weeks    Status New    Target Date 07/19/20      SLP LONG TERM GOAL #4   Title Patient will report improved communication effectiveness as measured by Communicative Effectiveness Survey.    Baseline 21/32 on 06/10/2020, higher score = "More effective"    Time 4    Period Weeks    Status New    Target Date 07/19/20  SLP LONG TERM GOAL #5   Title Patient will participate in Modified Barium Swallow study for assessment of swallow function if clinically indicated.    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 Patient continues to consume liquids without overt signs of aspiration; will continue to monitor. Patient benefitted from demonstration and modeling for breath support. Patient is beginning to carryover loudness during short conversation. I recommend skilled ST for LSVT-LOUD treatment 4 days a week x4 weeks to improve vocal quality and ability to communicate with family and friends in the community.    Speech Therapy Frequency 4x / week    Duration 4 weeks    Treatment/Interventions Aspiration precaution training;SLP instruction and feedback;Functional tasks;Patient/family education;Cueing hierarchy;Other (comment)   LSVT-LOUD   Potential to Achieve Goals Good    SLP Home Exercise Plan LSVT LOUD homework provided    Consulted and Agree with Plan of Care Patient;Family member/caregiver           Patient will benefit from skilled therapeutic intervention in order to improve the following deficits and impairments:   Dysarthria and anarthria  Parkinson's disease [facility])    Problem List Patient Active Problem List   Diagnosis Date Noted  . Chronic anemia 08/09/2018  . External hemorrhoids   . Hypertrophy of anal papillae   . Loss of weight   . Diverticulosis of large intestine without  diverticulitis   . Dehydration 07/15/2018  . Parkinson's disease ([place]) 04/18/2018  . Hyperlipidemia, mixed 10/27/2016  . Benign essential hypertension 06/11/2016  . 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 Primary cancer of lower-inner quadrant of left female breast ([place]) 12/04/2014  . Acquired spondylolisthesis 10/11/2013  . B12 deficiency 10/02/2013  . Lumbar disc disease 09/07/2013    Brooke Dare 07/01/2020, 5:04 PM  [place] MAIN [facility] SERVICES [address] [place], [place], [address] Phone: [phone]   Fax:  [phone]   Name: NOELIE RENFROW MRN: [id number] Date of Birth: 1950-02-21

## 2020-07-01 NOTE — Therapy (Signed)
[place] MAIN [facility] SERVICES [address] [place], [place], [address] Phone: [phone]   Fax:  [phone]  Occupational Therapy Progress Note  Dates of reporting period  06/10/2020  to   07/01/2020  Patient Details  Name: Kathy Bean MRN: [id number] Date of Birth: Jun 16, 1950 No data recorded  Encounter Date: 07/01/2020   OT End of Session - 07/01/20 1432    Visit Number 10    Number of Visits 17    Date for OT Re-Evaluation 07/19/20    OT Start Time 1300    OT Stop Time 1400    OT Time Calculation (min) 60 min    Activity Tolerance Patient tolerated treatment well    Behavior During Therapy [facility] for tasks assessed/performed           Past Medical History:  Diagnosis Date  . Breast cancer ([place]) 11/2014   left  . Cancer [facility]) 2016   left breast  . Depression   . Dysrhythmia    heart racing  . Family history of adverse reaction to anesthesia    brother had difficulty going under anesthseia  . Headache    hx of migraines   period induced  . Hypertension   . Osteoarthritis   . Parkinson disease ([place])   . Personal history of radiation therapy     Past Surgical History:  Procedure Laterality Date  . BREAST BIOPSY Left 10/2014   positive  . BREAST LUMPECTOMY Left 2016   invasive mammary  . COLONOSCOPY Left 07/17/2018   Procedure: COLONOSCOPY;  Surgeon: Virgel Manifold, MD;  Location: [facility] ENDOSCOPY;  Service: Endoscopy;  Laterality: Left;  . DILATION AND CURETTAGE OF UTERUS    . ESOPHAGOGASTRODUODENOSCOPY Left 07/17/2018   Procedure: ESOPHAGOGASTRODUODENOSCOPY (EGD);  Surgeon: Virgel Manifold, MD;  Location: [facility] ENDOSCOPY;  Service: Endoscopy;  Laterality: Left;  . Laminectomy Posterior Cervicle Decomp W/Facetectomy & Foraminotomy     [id number] x 3...Marland KitchenMarland KitchenEXCISION CENTRAL LEFT [place] HERNIATION L 2-3; Surgeon: Loni Dolly, MD; Location: [place]; Service: Orthopedics; Laterality: Left  . Laminectomy Posterior Lumbar  Facetectomy & Foraminotomy W/Decomp     BILATERAL LUMBAR DECOMPRESSION L2-3, L3-4, L4-5; Surgeon: Loni Dolly, MD; Location: [place]; Service: Orthopedics; Laterality: Bilateral;  . PARTIAL MASTECTOMY WITH NEEDLE LOCALIZATION Left 11/29/2014   Procedure: PARTIAL MASTECTOMY WITH NEEDLE LOCALIZATION;  Surgeon: Leonie Green, MD;  Location: [facility] ORS;  Service: General;  Laterality: Left;  . SENTINEL NODE BIOPSY Left 11/29/2014   Procedure: SENTINEL NODE BIOPSY;  Surgeon: Leonie Green, MD;  Location: [facility] ORS;  Service: General;  Laterality: Left;    There were no vitals filed for this visit.   Subjective Assessment - 07/01/20 1431    Subjective  Pt. reports that she is trying to do the exercises.at home    Pertinent History Pt was diagnosed with Parkinson's since July 2015.   Pt reports decline in strength in legs, walking, feels "wobble".  Reports feeling fuzzy at times, has some tremors right side.    Patient Stated Goals Pt would like to be as independent as possible, be more stable.    Currently in Pain? No/denies              [facility] OT Assessment - 07/01/20 1308      Coordination   Right 9 Hole Peg Test 26    Left 9 Hole Peg Test 28      Hand Function   Right Hand Grip (lbs) 33  Right Hand Lateral Pinch 13 lbs    Right Hand 3 Point Pinch 12 lbs    Left Hand Grip (lbs) 29    Left Hand Lateral Pinch 11 lbs    Left 3 point pinch 10 lbs          Pt. worked on [employer] version of the LSVT Maximal Daily Exercises. Pt. worked on refining sustained Floor to Ceiling, and Side to Side movements. Pt. Also worked on Multidirectional Repetitive Movements including: Forward Step, Sideways Step, Backward Step, and Forward Rock and Reach using a chair.  Pt. required verbal, and tactile cues, for amplitude, and BIGNESS of movements, and proper technique for the UEs, and LEs. Pt. then worked on transitioning the Forward step MDE without a chair for 1 set 10 reps each  with the right, and left side. Pt. required CGA.  Measurements were obtained. Goals were reviewed with the pt. Pt. has made progress since the initial LSVT visit. FOTO score: 40. Pt. has improved with bilateral grip strength, and bilateral [facility] skills. Pt. Improved the distance with the 6 min. Walk test to 575 feet. Up from 425 ft at the initial visit. Pt. continues to require assist with  Performing hair care, self-dressing, and toileting skills. Pt. continues to work towards improving sit to stand safely without the use of her arms. Pt. continues to work on progressing with the Maximal Daily Exercises, transitioning from the Adaptive version safely. Pt. continues to work towards improving UE functioning in order to improve safety with ADL, and IADL functioning.                  OT Education - 07/01/20 1432    Education Details maximal daily exercises, functional mobility, amplitude of steps    Person(s) Educated Patient;Spouse    Methods Explanation;Demonstration;Verbal cues    Comprehension Verbalized understanding;Returned demonstration               OT Long Term Goals - 07/01/20 1318      OT LONG TERM GOAL #1   Title Patient will improve gait speed and endurance to be able to ambulate 600 feet in 6 minutes to negotiate around the home and community safely in 4 weeks.    Baseline 07/01/2020: 575 feet; Eval: 425 feet at eval    Time 4    Status On-going    Target Date 07/19/20      OT LONG TERM GOAL #2   Title Patient will complete HEP for maximal daily exercises with modified independence in 4 weeks.    Baseline Pt. currently using the adaptive version of the MDEs.    Time 4    Period Weeks    Status On-going    Target Date 07/19/20      OT LONG TERM GOAL #3   Title Patient will transfer from to sit to stand without the use of arms safely and independently from a variety of chairs/surfaces in 4 weeks.    Baseline 07/01/2020: Pt. unable even with rocking for  momentum. Pt. unable at evaluation    Time 4    Period Weeks    Status New    Target Date 07/19/20      OT LONG TERM GOAL #4   Title Pt will complete toilet transfers with modified independence    Baseline Pt. continues to require occasional assist from husband    Time 4    Period Weeks    Status On-going    Target Date 07/19/20  OT LONG TERM GOAL #5   Title Pt will improve ROM in UE to complete hair care with occasional min assist    Baseline mod to maxA    Time 4    Period Weeks    Status On-going    Target Date 07/19/20      OT LONG TERM GOAL #6   Title Patient will don and doff socks with modified independence    Baseline Pt. continues to have difficulty    Time 4    Period Weeks    Status On-going    Target Date 07/19/20                 Plan - 07/01/20 1433    Clinical Impression Statement Measurements were obtained. Goals were reviewed with the pt. Pt. has made progress since the initial LSVT visit. FOTO score: 40. Pt. has improved with bilateral grip strength, and bilateral [facility] skills. Pt. Improved the distance with the 6 min. Walk test to 575 feet. Up from 425 ft at the initial visit. Pt. continues to require assist with  Performing hair care, self-dressing, and toileting skills. Pt. continues to work towards improving sit to stand safely without the use of her arms. Pt. continues to work on progressing with the Maximal Daily Exercises, transitioning from the Adaptive version safely. Pt. continues to work towards improving UE functioning in order to improve safety with ADL, and IADL functioning.   OT Occupational Profile and History Detailed Assessment- Review of Records and additional review of physical, cognitive, psychosocial history related to current functional performance    Occupational performance deficits (Please refer to evaluation for details): ADL's;IADL's;Leisure    Body Structure / Function / Physical Skills  ADL;Dexterity;Flexibility;ROM;Strength;Balance;Coordination;FMC;IADL;Body mechanics;Endurance;Gait;Pain;UE functional use;GMC;Mobility    Geophysicist/field seismologist;Sequencing    Psychosocial Skills Environmental  Adaptations;Habits;Routines and Behaviors    Rehab Potential Good    Clinical Decision Making Several treatment options, min-mod task modification necessary    Comorbidities Affecting Occupational Performance: May have comorbidities impacting occupational performance    Modification or Assistance to Complete Evaluation  No modification of tasks or assist necessary to complete eval    OT Frequency 4x / week    OT Duration 4 weeks    OT Treatment/Interventions Self-care/ADL training;Cryotherapy;Therapeutic exercise;DME and/or AE instruction;Functional Mobility Training;Balance training;Neuromuscular education;Gait Training;Manual Therapy;Moist Heat;Contrast Bath;Passive range of motion;Therapeutic activities;Patient/family education    Consulted and Agree with Plan of Care Patient;Family member/caregiver           Patient will benefit from skilled therapeutic intervention in order to improve the following deficits and impairments:   Body Structure / Function / Physical Skills: ADL,Dexterity,Flexibility,ROM,Strength,Balance,Coordination,FMC,IADL,Body mechanics,Endurance,Gait,Pain,UE functional use,GMC,Mobility Cognitive Skills: Attention,Memory,Sequencing Psychosocial Skills: Environmental  Adaptations,Habits,Routines and Behaviors   Visit Diagnosis: Muscle weakness (generalized)  Other lack of coordination    Problem List Patient Active Problem List   Diagnosis Date Noted  . Chronic anemia 08/09/2018  . External hemorrhoids   . Hypertrophy of anal papillae   . Loss of weight   . Diverticulosis of large intestine without diverticulitis   . Dehydration 07/15/2018  . Parkinson's disease ([place]) 04/18/2018  . Hyperlipidemia, mixed 10/27/2016  . Benign essential  hypertension 06/11/2016  . Malignant neoplasm of central portion of left female breast ([place]) 04/16/2015  . Primary cancer of lower-inner quadrant of left female breast ([place]) 12/04/2014  . Acquired spondylolisthesis 10/11/2013  . B12 deficiency 10/02/2013  . Lumbar disc disease 09/07/2013    Harrel Carina, MS, OTR/L 07/01/2020, 2:41 PM  [place] MAIN [facility] SERVICES [address] [place], [place], [address] Phone: [phone]   Fax:  [phone]  Name: Kathy Bean MRN: [id number] Date of Birth: 1950-08-28

## 2020-07-02 ENCOUNTER — Ambulatory Visit: Payer: Medicare Other | Admitting: Speech Pathology

## 2020-07-02 ENCOUNTER — Ambulatory Visit: Payer: Medicare Other | Admitting: Occupational Therapy

## 2020-07-02 ENCOUNTER — Encounter: Payer: Self-pay | Admitting: Occupational Therapy

## 2020-07-02 DIAGNOSIS — G20 Parkinson's disease: Secondary | ICD-10-CM

## 2020-07-02 DIAGNOSIS — R278 Other lack of coordination: Secondary | ICD-10-CM

## 2020-07-02 DIAGNOSIS — R471 Dysarthria and anarthria: Secondary | ICD-10-CM

## 2020-07-02 DIAGNOSIS — M6281 Muscle weakness (generalized): Secondary | ICD-10-CM | POA: Diagnosis not present

## 2020-07-02 NOTE — Therapy (Signed)
[place] MAIN [facility] SERVICES [address] [place], [place], [address] Phone: [phone]   Fax:  [phone]  Occupational Therapy Treatment  Patient Details  Name: Kathy Bean MRN: [id number] Date of Birth: 1950/08/22 No data recorded  Encounter Date: 07/02/2020   OT End of Session - 07/02/20 1417    Visit Number 11    Number of Visits 17    Date for OT Re-Evaluation 07/19/20    Authorization Type Progress reporting period starting 07/01/2020    OT Start Time 1300    OT Stop Time 1355    OT Time Calculation (min) 55 min    Activity Tolerance Patient tolerated treatment well    Behavior During Therapy [facility] for tasks assessed/performed           Past Medical History:  Diagnosis Date  . Breast cancer ([place]) 11/2014   left  . Cancer [facility]) 2016   left breast  . Depression   . Dysrhythmia    heart racing  . Family history of adverse reaction to anesthesia    brother had difficulty going under anesthseia  . Headache    hx of migraines   period induced  . Hypertension   . Osteoarthritis   . Parkinson disease ([place])   . Personal history of radiation therapy     Past Surgical History:  Procedure Laterality Date  . BREAST BIOPSY Left 10/2014   positive  . BREAST LUMPECTOMY Left 2016   invasive mammary  . COLONOSCOPY Left 07/17/2018   Procedure: COLONOSCOPY;  Surgeon: Virgel Manifold, MD;  Location: [facility] ENDOSCOPY;  Service: Endoscopy;  Laterality: Left;  . DILATION AND CURETTAGE OF UTERUS    . ESOPHAGOGASTRODUODENOSCOPY Left 07/17/2018   Procedure: ESOPHAGOGASTRODUODENOSCOPY (EGD);  Surgeon: Virgel Manifold, MD;  Location: [facility] ENDOSCOPY;  Service: Endoscopy;  Laterality: Left;  . Laminectomy Posterior Cervicle Decomp W/Facetectomy & Foraminotomy     [id number] x 3...Marland KitchenMarland KitchenEXCISION CENTRAL LEFT Lane HERNIATION L 2-3; Surgeon: Loni Dolly, MD; Location: [place]; Service: Orthopedics; Laterality: Left  . Laminectomy Posterior  Lumbar Facetectomy & Foraminotomy W/Decomp     BILATERAL LUMBAR DECOMPRESSION L2-3, L3-4, L4-5; Surgeon: Loni Dolly, MD; Location: [place]; Service: Orthopedics; Laterality: Bilateral;  . PARTIAL MASTECTOMY WITH NEEDLE LOCALIZATION Left 11/29/2014   Procedure: PARTIAL MASTECTOMY WITH NEEDLE LOCALIZATION;  Surgeon: Leonie Green, MD;  Location: [facility] ORS;  Service: General;  Laterality: Left;  . SENTINEL NODE BIOPSY Left 11/29/2014   Procedure: SENTINEL NODE BIOPSY;  Surgeon: Leonie Green, MD;  Location: [facility] ORS;  Service: General;  Laterality: Left;    There were no vitals filed for this visit.   Subjective Assessment - 07/02/20 1415    Subjective  Pt. reports having had intemittent temple headaches over the past few days. Each one lasts 10-15 sec.    Pertinent History Pt was diagnosed with Parkinson's since July 2015.   Pt reports decline in strength in legs, walking, feels "wobble".  Reports feeling fuzzy at times, has some tremors right side.    Currently in Pain? Yes    Pain Score 8     Pain Location Head    Pain Orientation Right    Pain Descriptors / Indicators Aching;Sharp    Pain Onset In the past 7 days    Pain Frequency Intermittent          OT TREATMENT    Pt. worked on the standard version of the LSVT Maximal Daily Exercises. Pt. worked  on refining sustained Floor to Ceiling, and Side to Side movements. Pt. Also worked on Multidirectional Repetitive Movements including: Forward Step, and Sideways Step. Pt. Worked on the [employer] version for Backward Step, and [employer] and [employer]. Pt. required verbal, and tactile cues, for amplitude, and BIGNESS of movements, and proper technique for the UEs, and LEs. Pt. then worked on transitioning the Forward step MDE without a chair for 1 set 10 reps each with the right, and left side. Pt. required CGA.  Pt. Worked on functional mobility with the rollator for 225 feet with cues required for amplitude of movements.    Pt. performed 7 hand exercises to help promote handwriting, dexterity and flexibility. 1) Fisting with arm then arm extension with fingers extended with BIG hand movements 2) oppositional movements of the thumb to each digit 3) wrist flex/ext with elbows extended 4) supination/pronation 5) tendon gliding with hook fist and then finger extension 6) tendon gliding with tabletop movement of fingers with MP flexion, PIP and DIP extension 7) MP flexion, PIP flexion, DIP extension Performed with therapist demo and occasional   Pt. Worked on 2 sets of 10 reps reaching to simulate brushing the back of her hair.  Pt. Worked on bilateral hand Xenia skills grasping coins from a flat tabletop, storing them in her palms, and moving them from the palms to the tip of her 2nd digit, and thumb in preparation for  placing them into a resistive slot.  Pt. Worked on manipulating small buttons, and snaps on a shirt  fastening, and unfastening them.   Pt. presented with intemittent 8/10 temple headache pain when performing the [facility] tasks. Lasting for 10-15 sec. During the OT session, pt. requested not to perform the LOUD "Ah's" with Speech therapy today. Pt. Was able to transition several of the MDEs to the standard version. A  chair was close by, however pt. did not need to use it. Pt. Performed the Adaptive version of the The Backwards step, and the [employer], and Reach MDEs with a chair. Pt. Required CGA. Pt. Had one episode of LOB with the [facility] and Reach to the right, however was able to self correct. Pt. Required cues, and assist with counting the reps with the Sustained Floor to Ceiling MDE. Pt. is making progress, and continues to work towards improving movement patterns, motor control, and  [facility] skills for improved overall ADL, and IADL functioning.                       OT Education - 07/02/20 1417    Education Details maximal daily exercises, functional mobility, amplitude of  steps    Person(s) Educated Patient;Spouse    Methods Explanation;Demonstration;Verbal cues    Comprehension Verbalized understanding;Returned demonstration               OT Long Term Goals - 07/01/20 1318      OT LONG TERM GOAL #1   Title Patient will improve gait speed and endurance to be able to ambulate 600 feet in 6 minutes to negotiate around the home and community safely in 4 weeks.    Baseline 07/01/2020: 575 feet; Eval: 425 feet at eval    Time 4    Status On-going    Target Date 07/19/20      OT LONG TERM GOAL #2   Title Patient will complete HEP for maximal daily exercises with modified independence in 4 weeks.    Baseline Pt. currently using the  adaptive version of the MDEs.    Time 4    Period Weeks    Status On-going    Target Date 07/19/20      OT LONG TERM GOAL #3   Title Patient will transfer from to sit to stand without the use of arms safely and independently from a variety of chairs/surfaces in 4 weeks.    Baseline 07/01/2020: Pt. unable even with rocking for momentum. Pt. unable at evaluation    Time 4    Period Weeks    Status New    Target Date 07/19/20      OT LONG TERM GOAL #4   Title Pt will complete toilet transfers with modified independence    Baseline Pt. continues to require occasional assist from husband    Time 4    Period Weeks    Status On-going    Target Date 07/19/20      OT LONG TERM GOAL #5   Title Pt will improve ROM in UE to complete hair care with occasional min assist    Baseline mod to maxA    Time 4    Period Weeks    Status On-going    Target Date 07/19/20      OT LONG TERM GOAL #6   Title Patient will don and doff socks with modified independence    Baseline Pt. continues to have difficulty    Time 4    Period Weeks    Status On-going    Target Date 07/19/20                 Plan - 07/02/20 1418    Clinical Impression Statement Pt. presented with intemittent 8/10 temple headache pain when performing the  [facility] tasks. Lasting for 10-15 sec. During the OT session, pt. requested not to perform the LOUD "Ah's" with Speech therapy today. Pt. Was able to transition several of the MDEs to the standard version. A  chair was close by, however pt. did not need to use it. Pt. Performed the Adaptive version of the The Backwards step, and the [employer], and Reach MDEs with a chair. Pt. Required CGA. Pt. Had one episode of LOB with the [facility] and Reach to the right, however was able to self correct. Pt. Required cues, and assist with counting the reps with the Sustained Floor to Ceiling MDE. Pt. is making progress, and continues to work towards improving movement patterns, motor control, and  [facility] skills for improved overall ADL, and IADL functioning.   OT Occupational Profile and History Detailed Assessment- Review of Records and additional review of physical, cognitive, psychosocial history related to current functional performance    Occupational performance deficits (Please refer to evaluation for details): ADL's;IADL's;Leisure    Body Structure / Function / Physical Skills ADL;Dexterity;Flexibility;ROM;Strength;Balance;Coordination;FMC;IADL;Body mechanics;Endurance;Gait;Pain;UE functional use;GMC;Mobility    Geophysicist/field seismologist;Sequencing    Psychosocial Skills Environmental  Adaptations;Habits;Routines and Behaviors    Rehab Potential Good    Clinical Decision Making Several treatment options, min-mod task modification necessary    Comorbidities Affecting Occupational Performance: May have comorbidities impacting occupational performance    Modification or Assistance to Complete Evaluation  No modification of tasks or assist necessary to complete eval    OT Frequency 4x / week    OT Duration 4 weeks    OT Treatment/Interventions Self-care/ADL training;Cryotherapy;Therapeutic exercise;DME and/or AE instruction;Functional Mobility Training;Balance training;Neuromuscular education;Gait  Training;Manual Therapy;Moist Heat;Contrast Bath;Passive range of motion;Therapeutic activities;Patient/family education    Consulted and Agree with  Plan of Care Patient;Family member/caregiver           Patient will benefit from skilled therapeutic intervention in order to improve the following deficits and impairments:   Body Structure / Function / Physical Skills: ADL,Dexterity,Flexibility,ROM,Strength,Balance,Coordination,FMC,IADL,Body mechanics,Endurance,Gait,Pain,UE functional use,GMC,Mobility Cognitive Skills: Attention,Memory,Sequencing Psychosocial Skills: Environmental  Adaptations,Habits,Routines and Behaviors   Visit Diagnosis: Muscle weakness (generalized)  Other lack of coordination    Problem List Patient Active Problem List   Diagnosis Date Noted  . Chronic anemia 08/09/2018  . External hemorrhoids   . Hypertrophy of anal papillae   . Loss of weight   . Diverticulosis of large intestine without diverticulitis   . Dehydration 07/15/2018  . Parkinson's disease ([place]) 04/18/2018  . Hyperlipidemia, mixed 10/27/2016  . Benign essential hypertension 06/11/2016  . 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 Primary cancer of lower-inner quadrant of left female breast ([place]) 12/04/2014  . Acquired spondylolisthesis 10/11/2013  . B12 deficiency 10/02/2013  . Lumbar disc disease 09/07/2013    Harrel Carina, MS, OTR/L 07/02/2020, 2:20 PM  [place] MAIN [facility] SERVICES [address] [place], [place], [address] Phone: [phone]   Fax:  [phone]  Name: Kathy Bean MRN: [id number] Date of Birth: [birth year]

## 2020-07-03 ENCOUNTER — Ambulatory Visit: Payer: Medicare Other | Admitting: Speech Pathology

## 2020-07-03 ENCOUNTER — Other Ambulatory Visit: Payer: Self-pay

## 2020-07-03 ENCOUNTER — Ambulatory Visit: Payer: Medicare Other | Admitting: Occupational Therapy

## 2020-07-03 ENCOUNTER — Encounter: Payer: Self-pay | Admitting: Occupational Therapy

## 2020-07-03 DIAGNOSIS — M6281 Muscle weakness (generalized): Secondary | ICD-10-CM

## 2020-07-03 DIAGNOSIS — G20 Parkinson's disease: Secondary | ICD-10-CM

## 2020-07-03 DIAGNOSIS — R471 Dysarthria and anarthria: Secondary | ICD-10-CM

## 2020-07-03 DIAGNOSIS — R278 Other lack of coordination: Secondary | ICD-10-CM

## 2020-07-03 NOTE — Therapy (Signed)
[place] MAIN [facility] SERVICES [address] [place], [place], [address] Phone: [phone]   Fax:  [phone]  Speech Language Pathology Treatment  Patient Details  Name: Kathy Bean MRN: [id number] Date of Birth: Mar 01, 1950 Referring Provider (SLP): Dr. Jennings Books   Encounter Date: 07/02/2020   End of Session - 07/03/20 0848    Number of Visits 17    Date for SLP Re-Evaluation 09/08/20    Authorization Type Medicare    Authorization - Visit Number 7    Progress Note Due on Visit 10    SLP Start Time 1400    SLP Stop Time  1500    SLP Time Calculation (min) 60 min    Activity Tolerance Patient tolerated treatment well;Patient limited by pain           Past Medical History:  Diagnosis Date  . Breast cancer ([place]) 11/2014   left  . Cancer [facility]) 2016   left breast  . Depression   . Dysrhythmia    heart racing  . Family history of adverse reaction to anesthesia    brother had difficulty going under anesthseia  . Headache    hx of migraines   period induced  . Hypertension   . Osteoarthritis   . Parkinson disease ([place])   . Personal history of radiation therapy     Past Surgical History:  Procedure Laterality Date  . BREAST BIOPSY Left 10/2014   positive  . BREAST LUMPECTOMY Left 2016   invasive mammary  . COLONOSCOPY Left 07/17/2018   Procedure: COLONOSCOPY;  Surgeon: Virgel Manifold, MD;  Location: [facility] ENDOSCOPY;  Service: Endoscopy;  Laterality: Left;  . DILATION AND CURETTAGE OF UTERUS    . ESOPHAGOGASTRODUODENOSCOPY Left 07/17/2018   Procedure: ESOPHAGOGASTRODUODENOSCOPY (EGD);  Surgeon: Virgel Manifold, MD;  Location: [facility] ENDOSCOPY;  Service: Endoscopy;  Laterality: Left;  . Laminectomy Posterior Cervicle Decomp W/Facetectomy & Foraminotomy     [id number] x 3...Marland KitchenMarland KitchenEXCISION CENTRAL LEFT [place] HERNIATION L 2-3; Surgeon: Loni Dolly, MD; Location: [place]; Service: Orthopedics; Laterality: Left  . Laminectomy  Posterior Lumbar Facetectomy & Foraminotomy W/Decomp     BILATERAL LUMBAR DECOMPRESSION L2-3, L3-4, L4-5; Surgeon: Loni Dolly, MD; Location: [place]; Service: Orthopedics; Laterality: Bilateral;  . PARTIAL MASTECTOMY WITH NEEDLE LOCALIZATION Left 11/29/2014   Procedure: PARTIAL MASTECTOMY WITH NEEDLE LOCALIZATION;  Surgeon: Leonie Green, MD;  Location: [facility] ORS;  Service: General;  Laterality: Left;  . SENTINEL NODE BIOPSY Left 11/29/2014   Procedure: SENTINEL NODE BIOPSY;  Surgeon: Leonie Green, MD;  Location: [facility] ORS;  Service: General;  Laterality: Left;    There were no vitals filed for this visit.   Subjective Assessment - 07/03/20 0821    Subjective "I didn't eat lunch today."    Currently in Pain? Yes    Pain Score 8     Pain Location Head    Pain Descriptors / Indicators Headache    Pain Frequency Intermittent    Aggravating Factors  Patient did not eat lunch                 ADULT SLP TREATMENT - 07/03/20 0824      General Information   Behavior/Cognition Alert;Cooperative    HPI Kathy Bean is a 70 y.o. female referred by Dr. 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 Per chart review pt with hypophonia, dysphagia      Cognitive-Linquistic Treatment   Treatment focused on Dysarthria;Patient/family/caregiver education  Skilled Treatment LSVT Daily tasks: Patient reported intermittent headache today and refused loud /a/ task, despite SLP encouragement and efforts to modify task (did not experience headache during session, but she was concerned loud /a/ would trigger this). Max F0 for pitch glides was 374 Hz (84 dB avg), min F0 for low glides was 194 Hz (83 dB avg); Functional phrases averaged 85 dB. Patient initially required minimal cues for breath pacing during reading of functional phrases. Patient completed phrase level reading tasks averaging 84 dB. Patient's response to spontaneous questions averaged 85dB. Patient was able to  maintain vocal quality/intensity for short conversations ranging 5-8 minutes over 30 minute period with occasional minimial cues.      Assessment / Recommendations / Plan   Plan Continue with current plan of care      Progression Toward Goals   Progression toward goals Progressing toward goals            SLP Education - 07/03/20 0845    Education Details Rationale for loud /a/    Person(s) Educated Patient    Methods Explanation    Comprehension Verbalized understanding            SLP Short Term Goals - 06/10/20 1750      SLP SHORT TERM GOAL #1   Title The patient will complete Daily Tasks (Maximum duration "ah", High/Lows, and Functional Phrases) at average loudness >/= 85 dB and with loud, good quality voice with min cues.    Time 10   sessions   Status New      SLP SHORT TERM GOAL #2   Title The patient will complete Hierarchal Speech Loudness reading drills (words/phrases, sentences) at average >/= 75 dB and with loud, good quality voice with min cues.    Time 10   sessions   Period Weeks    Status New      SLP SHORT TERM GOAL #3   Title The patient will participate in 5-8 minutes conversation, maintaining average loudness of 75 dB and loud, good quality voice with min cues.    Time 10   sessions   Status New            SLP Long Term Goals - 06/11/20 1408      SLP LONG TERM GOAL #1   Title The patient will complete Daily Tasks (Maximum duration "ah", High/Lows, and Functional Phrases) at average loudness >/= 85 dB and with loud, good quality voice.    Time 4   or 17 sessions, for all LTGs   Period Weeks    Status New    Target Date 07/19/20      SLP LONG TERM GOAL #2   Title The patient will complete Hierarchal Speech Loudness reading drills (words/phrases, sentences, and paragraph) at average >/= 75 dB and with loud, good quality voice.    Time 4    Period Weeks    Status New    Target Date 07/19/20      SLP LONG TERM GOAL #3   Title The patient will  participate in 15-20 minutes conversation, maintaining average loudness of >/= 75 dB and loud, good quality voice.    Time 4    Period Weeks    Status New    Target Date 07/19/20      SLP LONG TERM GOAL #4   Title Patient will report improved communication effectiveness as measured by Communicative Effectiveness Survey.    Baseline 21/32 on 06/10/2020, higher score = "More effective"  Time 4    Period Weeks    Status New    Target Date 07/19/20      SLP LONG TERM GOAL #5   Title Patient will participate in Modified Barium Swallow study for assessment of swallow function if clinically indicated.    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 Patient continues to consume liquids without overt signs of aspiration; will continue to monitor. Patient benefitted from explanation on rationale for daily LSVT tasks. Patient is beginning to carryover loudness during short conversation. I recommend skilled ST for LSVT-LOUD treatment 4 days a week x4 weeks to improve vocal quality and ability to communicate with family and friends in the community.    Speech Therapy Frequency 4x / week    Duration 4 weeks    Treatment/Interventions Aspiration precaution training;SLP instruction and feedback;Functional tasks;Patient/family education;Cueing hierarchy;Other (comment)   LSVT-LOUD   Potential to Achieve Goals Good    SLP Home Exercise Plan LSVT LOUD homework provided    Consulted and Agree with Plan of Care Patient;Family member/caregiver           Patient will benefit from skilled therapeutic intervention in order to improve the following deficits and impairments:   Dysarthria and anarthria  Parkinson's disease [facility])    Problem List Patient Active Problem List   Diagnosis Date Noted  . Chronic anemia 08/09/2018  .  External hemorrhoids   . Hypertrophy of anal papillae   . Loss of weight   . Diverticulosis of large intestine without diverticulitis   . Dehydration 07/15/2018  . Parkinson's disease ([place]) 04/18/2018  . Hyperlipidemia, mixed 10/27/2016  . Benign essential hypertension 06/11/2016  . 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 Primary cancer of lower-inner quadrant of left female breast ([place]) 12/04/2014  . Acquired spondylolisthesis 10/11/2013  . B12 deficiency 10/02/2013  . Lumbar disc disease 09/07/2013   Brooke Dare, SLP Graduate Student  Brooke Dare 07/03/2020, 8:56 AM  [place] MAIN [facility] SERVICES [address] [place], [place], [address] Phone: [phone]   Fax:  [phone]   Name: Kathy Bean MRN: [id number] Date of Birth: 13-Oct-1950

## 2020-07-03 NOTE — Therapy (Addendum)
[place] MAIN [facility] SERVICES [address] [place], [place], [address] Phone: [phone]   Fax:  [phone]  Occupational Therapy Treatment  Patient Details  Name: Kathy Bean MRN: [id number] Date of Birth: 1950-07-28 No data recorded  Encounter Date: 07/03/2020   OT End of Session - 07/03/20 1704    Visit Number 12    Number of Visits 17    Date for OT Re-Evaluation 07/19/20    Authorization Type Progress reporting period starting 07/01/2020    OT Start Time 1300    OT Stop Time 1400    OT Time Calculation (min) 60 min    Activity Tolerance Patient tolerated treatment well    Behavior During Therapy [facility] for tasks assessed/performed           Past Medical History:  Diagnosis Date  . Breast cancer ([place]) 11/2014   left  . Cancer [facility]) 2016   left breast  . Depression   . Dysrhythmia    heart racing  . Family history of adverse reaction to anesthesia    brother had difficulty going under anesthseia  . Headache    hx of migraines   period induced  . Hypertension   . Osteoarthritis   . Parkinson disease ([place])   . Personal history of radiation therapy     Past Surgical History:  Procedure Laterality Date  . BREAST BIOPSY Left 10/2014   positive  . BREAST LUMPECTOMY Left 2016   invasive mammary  . COLONOSCOPY Left 07/17/2018   Procedure: COLONOSCOPY;  Surgeon: Virgel Manifold, MD;  Location: [facility] ENDOSCOPY;  Service: Endoscopy;  Laterality: Left;  . DILATION AND CURETTAGE OF UTERUS    . ESOPHAGOGASTRODUODENOSCOPY Left 07/17/2018   Procedure: ESOPHAGOGASTRODUODENOSCOPY (EGD);  Surgeon: Virgel Manifold, MD;  Location: [name] ENDOSCOPY;  Service: Endoscopy;  Laterality: Left;  . Laminectomy Posterior Cervicle Decomp W/Facetectomy & Foraminotomy     [id number] x 3...Marland KitchenMarland KitchenEXCISION CENTRAL LEFT [place] HERNIATION L 2-3; Surgeon: Loni Dolly, MD; Location: [place]; Service: Orthopedics; Laterality: Left  . Laminectomy Posterior  Lumbar Facetectomy & Foraminotomy W/Decomp     BILATERAL LUMBAR DECOMPRESSION L2-3, L3-4, L4-5; Surgeon: Loni Dolly, MD; Location: [place]; Service: Orthopedics; Laterality: Bilateral;  . PARTIAL MASTECTOMY WITH NEEDLE LOCALIZATION Left 11/29/2014   Procedure: PARTIAL MASTECTOMY WITH NEEDLE LOCALIZATION;  Surgeon: Leonie Green, MD;  Location: [facility] ORS;  Service: General;  Laterality: Left;  . SENTINEL NODE BIOPSY Left 11/29/2014   Procedure: SENTINEL NODE BIOPSY;  Surgeon: Leonie Green, MD;  Location: [facility] ORS;  Service: General;  Laterality: Left;    There were no vitals filed for this visit.   Subjective Assessment - 07/03/20 1703    Subjective  Pt. reports no temple headaches today.    Pertinent History Pt was diagnosed with Parkinson's since July 2015.   Pt reports decline in strength in legs, walking, feels "wobble".  Reports feeling fuzzy at times, has some tremors right side.    Currently in Pain? No/denies            OT TREATMENT    Pt. worked on transitioning to the standard version of theLSVT Maximal Daily Exercises. Pt. worked on refining sustained Floor to Ceiling, and Side to Side movements. Pt. Also worked on Multidirectional Repetitive Movements including:Forward Step, Sideways Step, and Backward Step, andForward Rock and Reach.Pt. required verbal, and tactile cues, for amplitude, and BIGNESS of movements, and proper technique for the UEs, and LEs. Pt. Worked on  transitioning the MDE's to the standard version. Pt. required CGA during the Multidirectional Repetitive Movements.  Pt. worked on functional mobility with the rollator for 225 feet with cues required for amplitude of movements.   Pt. performed 7 hand exercises to help promote handwriting, dexterity and flexibility. 1) Fisting with arm then arm extension with fingers extended with BIG hand movements 2) oppositional movements of the thumb to each digit 3) wrist flex/ext with elbows  extended 4) supination/pronation 5) tendon gliding with hook fist and then finger extension 6) tendon gliding with tabletop movement of fingers with MP flexion, PIP and DIP extension 7) MP flexion, PIP flexion, DIP extension Performed with therapist demo and occasional   Pt. worked on bilateral hand [facility] skills grasping small buttons from the tabletop surface, and placing them into a container.    Pt. worked on Transport planner, and snaps on a shirt  fastening, and unfastening them.   Pt. worked on [facility] skills untying multiple double knots on a standard nylon rope.   Pt. presented with no intermittent temple headache pain today. pt. reports that she is feeling much better today. Pt. was able to transition the MDEs to the standard version. A  chair was close by, however pt. did not need to use it. Pt. required CGA during the Multidirectional Repetitive Movements. Pt. required CGA. Pt. had no episodes of LOB during the MDEs. Pt. required cues for counting reps, and visual demonstration for the exercises, as well as for the sequence of the 7 hand exercises. Pt. is making progress, and continues to work towards improving movement patterns, motor control, and  [facility] skills for improved overall ADL, and IADL functioning.                  OT Education - 07/03/20 1704    Education Details maximal daily exercises, functional mobility, amplitude of steps    Person(s) Educated Patient;Spouse    Methods Explanation;Demonstration;Verbal cues    Comprehension Verbalized understanding;Returned demonstration               OT Long Term Goals - 07/01/20 1318      OT LONG TERM GOAL #1   Title Patient will improve gait speed and endurance to be able to ambulate 600 feet in 6 minutes to negotiate around the home and community safely in 4 weeks.    Baseline 07/01/2020: 575 feet; Eval: 425 feet at eval    Time 4    Status On-going    Target Date 07/19/20      OT LONG TERM GOAL #2    Title Patient will complete HEP for maximal daily exercises with modified independence in 4 weeks.    Baseline Pt. currently using the adaptive version of the MDEs.    Time 4    Period Weeks    Status On-going    Target Date 07/19/20      OT LONG TERM GOAL #3   Title Patient will transfer from to sit to stand without the use of arms safely and independently from a variety of chairs/surfaces in 4 weeks.    Baseline 07/01/2020: Pt. unable even with rocking for momentum. Pt. unable at evaluation    Time 4    Period Weeks    Status New    Target Date 07/19/20      OT LONG TERM GOAL #4   Title Pt will complete toilet transfers with modified independence    Baseline Pt. continues to require occasional assist from  husband    Time 4    Period Weeks    Status On-going    Target Date 07/19/20      OT LONG TERM GOAL #5   Title Pt will improve ROM in UE to complete hair care with occasional min assist    Baseline mod to maxA    Time 4    Period Weeks    Status On-going    Target Date 07/19/20      OT LONG TERM GOAL #6   Title Patient will don and doff socks with modified independence    Baseline Pt. continues to have difficulty    Time 4    Period Weeks    Status On-going    Target Date 07/19/20                 Plan - 07/03/20 1705    Clinical Impression Statement Pt. presented with no intermittent temple headache pain today. pt. reports that she is feeling much better today. Pt. was able to transition the MDEs to the standard version. A  chair was close by, however pt. did not need to use it. Pt. required CGA during the Multidirectional Repetitive Movements. Pt. required CGA. Pt. had no episodes of LOB during the MDEs. Pt. required cues for counting reps, and visual demonstration for the exercises, as well as for the sequence of the 7 hand exercises. Pt. is making progress, and continues to work towards improving movement patterns, motor control, and  [facility] skills for improved  overall ADL, and IADL functioning.       OT Occupational Profile and History Detailed Assessment- Review of Records and additional review of physical, cognitive, psychosocial history related to current functional performance    Occupational performance deficits (Please refer to evaluation for details): ADL's;IADL's;Leisure    Body Structure / Function / Physical Skills ADL;Dexterity;Flexibility;ROM;Strength;Balance;Coordination;FMC;IADL;Body mechanics;Endurance;Gait;Pain;UE functional use;GMC;Mobility    Geophysicist/field seismologist;Sequencing    Psychosocial Skills Environmental  Adaptations;Habits;Routines and Behaviors    Rehab Potential Good    Clinical Decision Making Several treatment options, min-mod task modification necessary    Comorbidities Affecting Occupational Performance: May have comorbidities impacting occupational performance    Modification or Assistance to Complete Evaluation  No modification of tasks or assist necessary to complete eval    OT Frequency 4x / week    OT Duration 4 weeks    OT Treatment/Interventions Self-care/ADL training;Cryotherapy;Therapeutic exercise;DME and/or AE instruction;Functional Mobility Training;Balance training;Neuromuscular education;Gait Training;Manual Therapy;Moist Heat;Contrast Bath;Passive range of motion;Therapeutic activities;Patient/family education    Consulted and Agree with Plan of Care Patient;Family member/caregiver    Family Member Consulted husband at beginning of the session, daughter arrived 1/2 way into the session           Patient will benefit from skilled therapeutic intervention in order to improve the following deficits and impairments:   Body Structure / Function / Physical Skills: ADL,Dexterity,Flexibility,ROM,Strength,Balance,Coordination,FMC,IADL,Body mechanics,Endurance,Gait,Pain,UE functional use,GMC,Mobility Cognitive Skills: Attention,Memory,Sequencing Psychosocial Skills: Environmental   Adaptations,Habits,Routines and Behaviors   Visit Diagnosis: Muscle weakness (generalized)  Other lack of coordination    Problem List Patient Active Problem List   Diagnosis Date Noted  . Chronic anemia 08/09/2018  . External hemorrhoids   . Hypertrophy of anal papillae   . Loss of weight   . Diverticulosis of large intestine without diverticulitis   . Dehydration 07/15/2018  . Parkinson's disease ([place]) 04/18/2018  . Hyperlipidemia, mixed 10/27/2016  . Benign essential hypertension 06/11/2016  . Malignant neoplasm of central portion of left female  breast ([place]) 04/16/2015  . Primary cancer of lower-inner quadrant of left female breast ([place]) 12/04/2014  . Acquired spondylolisthesis 10/11/2013  . B12 deficiency 10/02/2013  . Lumbar disc disease 09/07/2013    Harrel Carina, MS, OTR/L 07/03/2020, 5:08 PM  [place] MAIN [facility] SERVICES [address] [place], [place], [address] Phone: [phone]   Fax:  [phone]  Name: Kathy Bean MRN: [id number] Date of Birth: [birth year]

## 2020-07-04 ENCOUNTER — Ambulatory Visit: Payer: Medicare Other | Admitting: Speech Pathology

## 2020-07-04 ENCOUNTER — Ambulatory Visit: Payer: Medicare Other | Admitting: Occupational Therapy

## 2020-07-04 DIAGNOSIS — G20 Parkinson's disease: Secondary | ICD-10-CM

## 2020-07-04 DIAGNOSIS — R278 Other lack of coordination: Secondary | ICD-10-CM

## 2020-07-04 DIAGNOSIS — R471 Dysarthria and anarthria: Secondary | ICD-10-CM

## 2020-07-04 DIAGNOSIS — M6281 Muscle weakness (generalized): Secondary | ICD-10-CM

## 2020-07-04 NOTE — Therapy (Signed)
[place] MAIN [facility] SERVICES [address] [place], [place], [address] Phone: [phone]   Fax:  [phone]  Speech Language Pathology Treatment  Patient Details  Name: Kathy Bean MRN: [id number] Date of Birth: 1950-12-08 Referring Provider (SLP): Dr. Jennings Books   Encounter Date: 07/04/2020   End of Session - 07/04/20 1512    Visit Number 9    Number of Visits 17    Date for SLP Re-Evaluation 09/08/20    Authorization Type Medicare    Authorization - Visit Number 9    Progress Note Due on Visit 10    SLP Start Time 1400    SLP Stop Time  1500    SLP Time Calculation (min) 60 min    Activity Tolerance Patient tolerated treatment well           Past Medical History:  Diagnosis Date  . Breast cancer ([place]) 11/2014   left  . Cancer [facility]) 2016   left breast  . Depression   . Dysrhythmia    heart racing  . Family history of adverse reaction to anesthesia    brother had difficulty going under anesthseia  . Headache    hx of migraines   period induced  . Hypertension   . Osteoarthritis   . Parkinson disease ([place])   . Personal history of radiation therapy     Past Surgical History:  Procedure Laterality Date  . BREAST BIOPSY Left 10/2014   positive  . BREAST LUMPECTOMY Left 2016   invasive mammary  . COLONOSCOPY Left 07/17/2018   Procedure: COLONOSCOPY;  Surgeon: Virgel Manifold, MD;  Location: [facility] ENDOSCOPY;  Service: Endoscopy;  Laterality: Left;  . DILATION AND CURETTAGE OF UTERUS    . ESOPHAGOGASTRODUODENOSCOPY Left 07/17/2018   Procedure: ESOPHAGOGASTRODUODENOSCOPY (EGD);  Surgeon: Virgel Manifold, MD;  Location: [facility] ENDOSCOPY;  Service: Endoscopy;  Laterality: Left;  . Laminectomy Posterior Cervicle Decomp W/Facetectomy & Foraminotomy     [id number] x 3...Marland KitchenMarland KitchenEXCISION CENTRAL LEFT [place] HERNIATION L 2-3; Surgeon: Loni Dolly, MD; Location: [place]; Service: Orthopedics; Laterality: Left  . Laminectomy  Posterior Lumbar Facetectomy & Foraminotomy W/Decomp     BILATERAL LUMBAR DECOMPRESSION L2-3, L3-4, L4-5; Surgeon: Loni Dolly, MD; Location: [place]; Service: Orthopedics; Laterality: Bilateral;  . PARTIAL MASTECTOMY WITH NEEDLE LOCALIZATION Left 11/29/2014   Procedure: PARTIAL MASTECTOMY WITH NEEDLE LOCALIZATION;  Surgeon: Leonie Green, MD;  Location: [facility] ORS;  Service: General;  Laterality: Left;  . SENTINEL NODE BIOPSY Left 11/29/2014   Procedure: SENTINEL NODE BIOPSY;  Surgeon: Leonie Green, MD;  Location: [facility] ORS;  Service: General;  Laterality: Left;    There were no vitals filed for this visit.   Subjective Assessment - 07/04/20 1504    Subjective Patient entered treatment room using loud, clear voice.    Currently in Pain? No/denies                 ADULT SLP TREATMENT - 07/04/20 1504      General Information   Behavior/Cognition Alert;Cooperative    HPI [name] is a 70 y.o. female referred by Dr. 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 Per chart review pt with hypophonia, dysphagia      Treatment Provided   Treatment provided Cognitive-Linquistic      Pain Assessment   Pain Assessment No/denies pain      Cognitive-Linquistic Treatment   Treatment focused on Dysarthria;Patient/family/caregiver education    Skilled Treatment LSVT Daily tasks: Loud /  a/ task averaged 84 dB, with a duration average of 5.3 seconds; laughing during exercise at times. Patient completed high and low pitch tasks with minimal cues for pitch and loudness. Patient's max F0 for pitch glides was 386 Hz (84 dB avg). Patient's min F0 for low glides was 191 Hz (85 dB avg); Functional phrases averaged 86 dB. Progressed to paragraph level reading task with initial model for rate/loudness, average 85 dB. When progressing to multiparagraph reading, patient increased rate of speech and required more frequent cues for pacing and breath support to maintain vocal quality  and loudness (average 84 dB). 10 minutes structured conversation with occasional min cues to maintain loudness, average 82 dB.      Assessment / Recommendations / Plan   Plan Continue with current plan of care      Progression Toward Goals   Progression toward goals Progressing toward goals            SLP Education - 07/04/20 1512    Education Details Parkinson's can cause more rapid rate of speech    Person(s) Educated Patient;Child(ren)    Methods Explanation    Comprehension Verbalized understanding            SLP Short Term Goals - 06/10/20 1750      SLP SHORT TERM GOAL #1   Title The patient will complete Daily Tasks (Maximum duration "ah", High/Lows, and Functional Phrases) at average loudness >/= 85 dB and with loud, good quality voice with min cues.    Time 10   sessions   Status New      SLP SHORT TERM GOAL #2   Title The patient will complete Hierarchal Speech Loudness reading drills (words/phrases, sentences) at average >/= 75 dB and with loud, good quality voice with min cues.    Time 10   sessions   Period Weeks    Status New      SLP SHORT TERM GOAL #3   Title The patient will participate in 5-8 minutes conversation, maintaining average loudness of 75 dB and loud, good quality voice with min cues.    Time 10   sessions   Status New            SLP Long Term Goals - 06/11/20 1408      SLP LONG TERM GOAL #1   Title The patient will complete Daily Tasks (Maximum duration "ah", High/Lows, and Functional Phrases) at average loudness >/= 85 dB and with loud, good quality voice.    Time 4   or 17 sessions, for all LTGs   Period Weeks    Status New    Target Date 07/19/20      SLP LONG TERM GOAL #2   Title The patient will complete Hierarchal Speech Loudness reading drills (words/phrases, sentences, and paragraph) at average >/= 75 dB and with loud, good quality voice.    Time 4    Period Weeks    Status New    Target Date 07/19/20      SLP LONG TERM  GOAL #3   Title The patient will participate in 15-20 minutes conversation, maintaining average loudness of >/= 75 dB and loud, good quality voice.    Time 4    Period Weeks    Status New    Target Date 07/19/20      SLP LONG TERM GOAL #4   Title Patient will report improved communication effectiveness as measured by Communicative Effectiveness Survey.    Baseline 21/32 on  06/10/2020, higher score = "More effective"    Time 4    Period Weeks    Status New    Target Date 07/19/20      SLP LONG TERM GOAL #5   Title Patient will participate in Modified Barium Swallow study for assessment of swallow function if clinically indicated.    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 Patient continues to consume liquids without overt signs of aspiration; will continue to monitor. Patient able to progress to paragraph and multi-paragraph reading however requires modeling cues to slow rate to maintain loudness and vocal quality. Patient is beginning to carryover loudness during short conversation. I recommend skilled ST for LSVT-LOUD treatment 4 days a week x4 weeks to improve vocal quality and ability to communicate with family and friends in the community.    Speech Therapy Frequency 4x / week    Duration 4 weeks    Treatment/Interventions Aspiration precaution training;SLP instruction and feedback;Functional tasks;Patient/family education;Cueing hierarchy;Other (comment)   LSVT-LOUD   Potential to Achieve Goals Good    SLP Home Exercise Plan LSVT LOUD homework provided    Consulted and Agree with Plan of Care Patient;Family member/caregiver           Patient will benefit from skilled therapeutic intervention in order to improve the following deficits and impairments:   Dysarthria and anarthria  Parkinson's disease  [facility])    Problem List Patient Active Problem List   Diagnosis Date Noted  . Chronic anemia 08/09/2018  . External hemorrhoids   . Hypertrophy of anal papillae   . Loss of weight   . Diverticulosis of large intestine without diverticulitis   . Dehydration 07/15/2018  . Parkinson's disease ([place]) 04/18/2018  . Hyperlipidemia, mixed 10/27/2016  . Benign essential hypertension 06/11/2016  . Malignant neoplasm of central portion of left female breast ([place]) 04/16/2015  . Primary cancer of lower-inner quadrant of left female breast ([place]) 12/04/2014  . Acquired spondylolisthesis 10/11/2013  . B12 deficiency 10/02/2013  . Lumbar disc disease 09/07/2013    Deneise Lever, [place], [place] Speech-Language Pathologist  Aliene Altes 07/04/2020, 3:14 PM  [place] [address] [place], [place], [address] Phone: [phone]   Fax:  [phone]   Name: SHARLIZE HOAR MRN: [id number] Date of Birth: 1950-04-20

## 2020-07-04 NOTE — Therapy (Signed)
[place] MAIN [facility] SERVICES [address] [place], [place], [address] Phone: [phone]   Fax:  [phone]  Speech Language Pathology Treatment  Patient Details  Name: Kathy Bean MRN: [id number] Date of Birth: 1951-02-06 Referring Provider (SLP): Dr. Jennings Books   Encounter Date: 07/03/2020   End of Session - 07/03/20 1649    Visit Number 8    Number of Visits 17    Date for SLP Re-Evaluation 09/08/20    Authorization Type Medicare    Authorization - Visit Number 8    Progress Note Due on Visit 10    SLP Start Time 1400    SLP Stop Time  1500    SLP Time Calculation (min) 60 min    Activity Tolerance Patient tolerated treatment well           Past Medical History:  Diagnosis Date  . Breast cancer ([place]) 11/2014   left  . Cancer [facility]) 2016   left breast  . Depression   . Dysrhythmia    heart racing  . Family history of adverse reaction to anesthesia    brother had difficulty going under anesthseia  . Headache    hx of migraines   period induced  . Hypertension   . Osteoarthritis   . Parkinson disease ([place])   . Personal history of radiation therapy     Past Surgical History:  Procedure Laterality Date  . BREAST BIOPSY Left 10/2014   positive  . BREAST LUMPECTOMY Left 2016   invasive mammary  . COLONOSCOPY Left 07/17/2018   Procedure: COLONOSCOPY;  Surgeon: Virgel Manifold, MD;  Location: [facility] ENDOSCOPY;  Service: Endoscopy;  Laterality: Left;  . DILATION AND CURETTAGE OF UTERUS    . ESOPHAGOGASTRODUODENOSCOPY Left 07/17/2018   Procedure: ESOPHAGOGASTRODUODENOSCOPY (EGD);  Surgeon: Virgel Manifold, MD;  Location: [facility] ENDOSCOPY;  Service: Endoscopy;  Laterality: Left;  . Laminectomy Posterior Cervicle Decomp W/Facetectomy & Foraminotomy     [id number] x 3...Marland KitchenMarland KitchenEXCISION CENTRAL LEFT [place] HERNIATION L 2-3; Surgeon: Loni Dolly, MD; Location: [place]; Service: Orthopedics; Laterality: Left  . Laminectomy  Posterior Lumbar Facetectomy & Foraminotomy W/Decomp     BILATERAL LUMBAR DECOMPRESSION L2-3, L3-4, L4-5; Surgeon: Loni Dolly, MD; Location: [place]; Service: Orthopedics; Laterality: Bilateral;  . PARTIAL MASTECTOMY WITH NEEDLE LOCALIZATION Left 11/29/2014   Procedure: PARTIAL MASTECTOMY WITH NEEDLE LOCALIZATION;  Surgeon: Leonie Green, MD;  Location: [facility] ORS;  Service: General;  Laterality: Left;  . SENTINEL NODE BIOPSY Left 11/29/2014   Procedure: SENTINEL NODE BIOPSY;  Surgeon: Leonie Green, MD;  Location: [facility] ORS;  Service: General;  Laterality: Left;    There were no vitals filed for this visit.   Subjective Assessment - 07/03/20 1630    Subjective "I ate yogurt today."    Currently in Pain? No/denies                 ADULT SLP TREATMENT - 07/03/20 1631      General Information   Behavior/Cognition Alert;Cooperative    HPI Kathy Bean is a 70 y.o. female referred by Dr. 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 Per chart review pt with hypophonia, dysphagia      Treatment Provided   Treatment provided Cognitive-Linquistic      Cognitive-Linquistic Treatment   Treatment focused on Dysarthria;Patient/family/caregiver education    Skilled Treatment LSVT Daily tasks: Loud /a/ task averaged 83 dB, with a duration average of 6.2 seconds. Patient completed high and low  pitch tasks with minimal to moderate cues for breath support and pacing. Patient's max F0 for pitch glides was 362 Hz (85 dB avg). Patient's min F0 for low glides was 194 Hz (84 dB avg); Functional phrases averaged 85 dB. Patient completed phrase level reading tasks averaging 84 dB, initially requiring moderate cues for breath support and rate/pacing. Patient benefitted from SLP modeling/demonstration of paragraph reading and was able to carryover the demonstration/modeling throguhout the remainder of paragraph reading. Patient's response to spontaneous questions averaged 82 dB.       Assessment / Recommendations / Plan   Plan Continue with current plan of care      Progression Toward Goals   Progression toward goals Progressing toward goals            SLP Education - 07/04/20 0824    Education Details provided education on breath support    Person(s) Educated Patient    Methods Demonstration;Verbal cues;Explanation    Comprehension Need further instruction;Returned demonstration            SLP Short Term Goals - 06/10/20 1750      SLP SHORT TERM GOAL #1   Title The patient will complete Daily Tasks (Maximum duration "ah", High/Lows, and Functional Phrases) at average loudness >/= 85 dB and with loud, good quality voice with min cues.    Time 10   sessions   Status New      SLP SHORT TERM GOAL #2   Title The patient will complete Hierarchal Speech Loudness reading drills (words/phrases, sentences) at average >/= 75 dB and with loud, good quality voice with min cues.    Time 10   sessions   Period Weeks    Status New      SLP SHORT TERM GOAL #3   Title The patient will participate in 5-8 minutes conversation, maintaining average loudness of 75 dB and loud, good quality voice with min cues.    Time 10   sessions   Status New            SLP Long Term Goals - 06/11/20 1408      SLP LONG TERM GOAL #1   Title The patient will complete Daily Tasks (Maximum duration "ah", High/Lows, and Functional Phrases) at average loudness >/= 85 dB and with loud, good quality voice.    Time 4   or 17 sessions, for all LTGs   Period Weeks    Status New    Target Date 07/19/20      SLP LONG TERM GOAL #2   Title The patient will complete Hierarchal Speech Loudness reading drills (words/phrases, sentences, and paragraph) at average >/= 75 dB and with loud, good quality voice.    Time 4    Period Weeks    Status New    Target Date 07/19/20      SLP LONG TERM GOAL #3   Title The patient will participate in 15-20 minutes conversation, maintaining average loudness  of >/= 75 dB and loud, good quality voice.    Time 4    Period Weeks    Status New    Target Date 07/19/20      SLP LONG TERM GOAL #4   Title Patient will report improved communication effectiveness as measured by Communicative Effectiveness Survey.    Baseline 21/32 on 06/10/2020, higher score = "More effective"    Time 4    Period Weeks    Status New    Target Date 07/19/20  SLP LONG TERM GOAL #5   Title Patient will participate in Modified Barium Swallow study for assessment of swallow function if clinically indicated.    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 Patient continues to consume liquids without overt signs of aspiration; will continue to monitor. Patient benefitted from demonstration and modeling of using breath support during paragraph reading for hierachial speech task. Patient is beginning to carryover loudness during short conversation. I recommend skilled ST for LSVT-LOUD treatment 4 days a week x4 weeks to improve vocal quality and ability to communicate with family and friends in the community.    Speech Therapy Frequency 4x / week    Duration 4 weeks    Treatment/Interventions Aspiration precaution training;SLP instruction and feedback;Functional tasks;Patient/family education;Cueing hierarchy;Other (comment)   LSVT-LOUD   Potential to Achieve Goals Good    SLP Home Exercise Plan LSVT LOUD homework provided    Consulted and Agree with Plan of Care Patient;Family member/caregiver           Patient will benefit from skilled therapeutic intervention in order to improve the following deficits and impairments:   Dysarthria and anarthria  Parkinson's disease [facility])    Problem List Patient Active Problem List   Diagnosis Date Noted  . Chronic anemia 08/09/2018  . External  hemorrhoids   . Hypertrophy of anal papillae   . Loss of weight   . Diverticulosis of large intestine without diverticulitis   . Dehydration 07/15/2018  . Parkinson's disease ([place]) 04/18/2018  . Hyperlipidemia, mixed 10/27/2016  . Benign essential hypertension 06/11/2016  . Malignant neoplasm of central portion of left female breast ([place]) 04/16/2015  . Primary cancer of lower-inner quadrant of left female breast ([place]) 12/04/2014  . Acquired spondylolisthesis 10/11/2013  . B12 deficiency 10/02/2013  . Lumbar disc disease 09/07/2013   Brooke Dare, SLP Graduate Student  Brooke Dare 07/04/2020, 8:35 AM  [place] MAIN [facility] SERVICES [address] [place], [place], [address] Phone: [phone]   Fax:  [phone]   Name: Kathy Bean MRN: [id number] Date of Birth: Apr 30, 1950

## 2020-07-04 NOTE — Therapy (Addendum)
[place] MAIN [facility] SERVICES [address] [place], [place], [address] Phone: [phone]   Fax:  [phone]  Occupational Therapy Treatment  Patient Details  Name: Kathy Bean MRN: [id number] Date of Birth: [birth year] No data recorded  Encounter Date: 07/04/2020   OT End of Session - 07/04/20 1413    Visit Number 13    Number of Visits 17    Date for OT Re-Evaluation 07/19/20    Authorization Type Progress reporting period starting 07/01/2020    OT Start Time 1308    OT Stop Time 1400    OT Time Calculation (min) 52 min    Activity Tolerance Patient tolerated treatment well           Past Medical History:  Diagnosis Date  . Breast cancer ([place]) 11/2014   left  . Cancer [facility]) 2016   left breast  . Depression   . Dysrhythmia    heart racing  . Family history of adverse reaction to anesthesia    brother had difficulty going under anesthseia  . Headache    hx of migraines   period induced  . Hypertension   . Osteoarthritis   . Parkinson disease ([place])   . Personal history of radiation therapy     Past Surgical History:  Procedure Laterality Date  . BREAST BIOPSY Left 10/2014   positive  . BREAST LUMPECTOMY Left 2016   invasive mammary  . COLONOSCOPY Left 07/17/2018   Procedure: COLONOSCOPY;  Surgeon: Virgel Manifold, MD;  Location: [facility] ENDOSCOPY;  Service: Endoscopy;  Laterality: Left;  . DILATION AND CURETTAGE OF UTERUS    . ESOPHAGOGASTRODUODENOSCOPY Left 07/17/2018   Procedure: ESOPHAGOGASTRODUODENOSCOPY (EGD);  Surgeon: Virgel Manifold, MD;  Location: [facility] ENDOSCOPY;  Service: Endoscopy;  Laterality: Left;  . Laminectomy Posterior Cervicle Decomp W/Facetectomy & Foraminotomy     [id number] x 3...Marland KitchenMarland KitchenEXCISION CENTRAL LEFT [place] HERNIATION L 2-3; Surgeon: Loni Dolly, MD; Location: [place]; Service: Orthopedics; Laterality: Left  . Laminectomy Posterior Lumbar Facetectomy & Foraminotomy W/Decomp     BILATERAL  LUMBAR DECOMPRESSION L2-3, L3-4, L4-5; Surgeon: Loni Dolly, MD; Location: [place]; Service: Orthopedics; Laterality: Bilateral;  . PARTIAL MASTECTOMY WITH NEEDLE LOCALIZATION Left 11/29/2014   Procedure: PARTIAL MASTECTOMY WITH NEEDLE LOCALIZATION;  Surgeon: Leonie Green, MD;  Location: [facility] ORS;  Service: General;  Laterality: Left;  . SENTINEL NODE BIOPSY Left 11/29/2014   Procedure: SENTINEL NODE BIOPSY;  Surgeon: Leonie Green, MD;  Location: [facility] ORS;  Service: General;  Laterality: Left;    There were no vitals filed for this visit.   Subjective Assessment - 07/04/20 1413    Subjective  Pt. reports that she had 1 headache in her temple last night.    Pertinent History Pt was diagnosed with Parkinson's since July 2015.   Pt reports decline in strength in legs, walking, feels "wobble".  Reports feeling fuzzy at times, has some tremors right side.    Patient Stated Goals Pt would like to be as independent as possible, be more stable.    Currently in Pain? No/denies           OT TREATMENT  Pt. worked on transitioning tothe standardversion of theLSVT Maximal Daily Exercises. Pt. worked on refining sustained Floor to Ceiling, and Side to Side movements. Pt. Also worked on Multidirectional Repetitive Movements including:Forward Step,Sideways Step, and Backward Step, andForward Rock and Reach.Pt. required verbal, and tactile cues, for amplitude, and BIGNESS of movements, and proper technique  for the UEs, and LEs. Pt. Worked on transitioning the Multidirectional Repetitive Movements to the standard version with CGA.  Pt. worked on functional mobility with the rollator for 225 feet with cues required for amplitude of movements.   Pt. performed7 hand exercises to help promote handwriting, dexterity and flexibility. 1) Fisting with arm then arm extension with fingers extended with BIG hand movements 2) oppositional movements of the thumb to each digit 3) wrist  flex/ext with elbows extended 4) supination/pronation 5) tendon gliding with hook fist and then finger extension 6) tendon gliding with tabletop movement of fingers with MP flexion, PIP and DIP extension 7) MP flexion, PIP flexion, DIP extension Performed with therapist demo and occasional  Pt. worked on bilateral hand [facility] skills grasping small buttons from the tabletop surface, and placing them into a container.    Pt. worked on Transport planner, and snaps on a shirt fastening, and unfastening them.   Pt. Was late arriving today. Pt. presented with no intermittent temple headache pain today, however reports having had one last night.  Pt. was able to demonstrate the techniques for the standard version of the MDE's with CGA. Pt. required verbal cues, cues for visual demonstration, and rest breaks.  A  chair was close by, however pt. did not need to use it. Pt. required CGA during the Multidirectional Repetitive Movements. Pt. required CGA. Pt. had 1 episode of LOB during the Sideways Step Multidirectional task requiring assist to right herself to midline. Pt. required cues for counting reps, and visual demonstration for the exercises, as well as for the sequence of the 7 hand exercises. Pt. Continues to make progress with transitioning to the standard version of the MDE's, however continues to require CGA. Pt. continues to work towards improving movement patterns, motor control, and  [facility] skills for improved overall ADL, and IADL functioning.                          OT Education - 07/04/20 1413    Education Details maximal daily exercises, functional mobility, amplitude of steps    Person(s) Educated Patient;Spouse    Comprehension Verbalized understanding;Returned demonstration               OT Long Term Goals - 07/01/20 1318      OT LONG TERM GOAL #1   Title Patient will improve gait speed and endurance to be able to ambulate 600 feet in 6 minutes  to negotiate around the home and community safely in 4 weeks.    Baseline 07/01/2020: 575 feet; Eval: 425 feet at eval    Time 4    Status On-going    Target Date 07/19/20      OT LONG TERM GOAL #2   Title Patient will complete HEP for maximal daily exercises with modified independence in 4 weeks.    Baseline Pt. currently using the adaptive version of the MDEs.    Time 4    Period Weeks    Status On-going    Target Date 07/19/20      OT LONG TERM GOAL #3   Title Patient will transfer from to sit to stand without the use of arms safely and independently from a variety of chairs/surfaces in 4 weeks.    Baseline 07/01/2020: Pt. unable even with rocking for momentum. Pt. unable at evaluation    Time 4    Period Weeks    Status New    Target Date  07/19/20      OT LONG TERM GOAL #4   Title Pt will complete toilet transfers with modified independence    Baseline Pt. continues to require occasional assist from husband    Time 4    Period Weeks    Status On-going    Target Date 07/19/20      OT LONG TERM GOAL #5   Title Pt will improve ROM in UE to complete hair care with occasional min assist    Baseline mod to maxA    Time 4    Period Weeks    Status On-going    Target Date 07/19/20      OT LONG TERM GOAL #6   Title Patient will don and doff socks with modified independence    Baseline Pt. continues to have difficulty    Time 4    Period Weeks    Status On-going    Target Date 07/19/20                 Plan - 07/04/20 1414    Clinical Impression Statement Pt. Was late arriving today. Pt. presented with no intermittent temple headache pain today, however reports having had one last night.  Pt. was able to demonstrate the techniques for the standard version of the MDE's with CGA. Pt. required verbal cues, cues for visual demonstration, and rest breaks.  A  chair was close by, however pt. did not need to use it. Pt. required CGA during the Multidirectional Repetitive  Movements. Pt. required CGA. Pt. had 1 episode of LOB during the Sideways Step Multidirectional task requiring assist to right herself to midline. Pt. required cues for counting reps, and visual demonstration for the exercises, as well as for the sequence of the 7 hand exercises. Pt. Continues to make progress with transitioning to the standard version of the MDE's, however continues to require CGA. Pt. continues to work towards improving movement patterns, motor control, and  [facility] skills for improved overall ADL, and IADL functioning.        OT Occupational Profile and History Detailed Assessment- Review of Records and additional review of physical, cognitive, psychosocial history related to current functional performance    Occupational performance deficits (Please refer to evaluation for details): ADL's;IADL's;Leisure    Body Structure / Function / Physical Skills ADL;Dexterity;Flexibility;ROM;Strength;Balance;Coordination;FMC;IADL;Body mechanics;Endurance;Gait;Pain;UE functional use;GMC;Mobility    Geophysicist/field seismologist;Sequencing    Psychosocial Skills Environmental  Adaptations;Habits;Routines and Behaviors    Rehab Potential Good    Clinical Decision Making Several treatment options, min-mod task modification necessary    Comorbidities Affecting Occupational Performance: May have comorbidities impacting occupational performance    Modification or Assistance to Complete Evaluation  No modification of tasks or assist necessary to complete eval    OT Frequency 4x / week    OT Duration 4 weeks    OT Treatment/Interventions Self-care/ADL training;Cryotherapy;Therapeutic exercise;DME and/or AE instruction;Functional Mobility Training;Balance training;Neuromuscular education;Gait Training;Manual Therapy;Moist Heat;Contrast Bath;Passive range of motion;Therapeutic activities;Patient/family education    Consulted and Agree with Plan of Care Patient;Family member/caregiver    Family Member  Consulted husband at beginning of the session, daughter arrived 1/2 way into the session           Patient will benefit from skilled therapeutic intervention in order to improve the following deficits and impairments:   Body Structure / Function / Physical Skills: ADL,Dexterity,Flexibility,ROM,Strength,Balance,Coordination,FMC,IADL,Body mechanics,Endurance,Gait,Pain,UE functional use,GMC,Mobility Cognitive Skills: Attention,Memory,Sequencing Psychosocial Skills: Environmental  Adaptations,Habits,Routines and Behaviors   Visit Diagnosis: Muscle weakness (generalized)  Other  lack of coordination    Problem List Patient Active Problem List   Diagnosis Date Noted  . Chronic anemia 08/09/2018  . External hemorrhoids   . Hypertrophy of anal papillae   . Loss of weight   . Diverticulosis of large intestine without diverticulitis   . Dehydration 07/15/2018  . Parkinson's disease ([place]) 04/18/2018  . Hyperlipidemia, mixed 10/27/2016  . Benign essential hypertension 06/11/2016  . Malignant neoplasm of central portion of left female breast ([place]) 04/16/2015  . Primary cancer of lower-inner quadrant of left female breast ([place]) 12/04/2014  . Acquired spondylolisthesis 10/11/2013  . B12 deficiency 10/02/2013  . Lumbar disc disease 09/07/2013    Harrel Carina, MS, OTR/L 07/04/2020, 2:17 PM  [place] MAIN [facility] SERVICES [address] [place], [place], [address] Phone: [phone]   Fax:  [phone]  Name: Kathy Bean MRN: [id number] Date of Birth: [birth year]

## 2020-07-06 ENCOUNTER — Other Ambulatory Visit: Payer: Self-pay

## 2020-07-06 ENCOUNTER — Emergency Department
Admission: EM | Admit: 2020-07-06 | Discharge: 2020-07-07 | Disposition: A | Discharge status: Home / Self Care | Payer: Medicare Other | Attending: Emergency Medicine | Admitting: Emergency Medicine

## 2020-07-06 ENCOUNTER — Emergency Department: Payer: Medicare Other

## 2020-07-06 ENCOUNTER — Encounter: Payer: Self-pay | Admitting: Emergency Medicine

## 2020-07-06 DIAGNOSIS — Z87891 Personal history of nicotine dependence: Secondary | ICD-10-CM | POA: Insufficient documentation

## 2020-07-06 DIAGNOSIS — R519 Headache, unspecified: Secondary | ICD-10-CM | POA: Insufficient documentation

## 2020-07-06 DIAGNOSIS — B9689 Other specified bacterial agents as the cause of diseases classified elsewhere: Secondary | ICD-10-CM | POA: Insufficient documentation

## 2020-07-06 DIAGNOSIS — Z853 Personal history of malignant neoplasm of breast: Secondary | ICD-10-CM | POA: Diagnosis not present

## 2020-07-06 DIAGNOSIS — N390 Urinary tract infection, site not specified: Secondary | ICD-10-CM | POA: Diagnosis not present

## 2020-07-06 DIAGNOSIS — Z79899 Other long term (current) drug therapy: Secondary | ICD-10-CM | POA: Insufficient documentation

## 2020-07-06 DIAGNOSIS — G20 Parkinson's disease: Secondary | ICD-10-CM | POA: Insufficient documentation

## 2020-07-06 DIAGNOSIS — I10 Essential (primary) hypertension: Secondary | ICD-10-CM | POA: Diagnosis not present

## 2020-07-06 LAB — URINALYSIS, COMPLETE (UACMP) WITH MICROSCOPIC
Bilirubin Urine: NEGATIVE
Glucose, UA: NEGATIVE mg/dL
Hgb urine dipstick: NEGATIVE
Ketones, ur: 5 mg/dL — AB
Nitrite: NEGATIVE
Protein, ur: NEGATIVE mg/dL
Specific Gravity, Urine: 1.017 (ref 1.005–1.030)
WBC, UA: >50 WBC/hpf — ABNORMAL HIGH (ref 0–5)
pH: 6 (ref 5.0–8.0)

## 2020-07-06 LAB — BASIC METABOLIC PANEL
Anion gap: 8 (ref 5–15)
BUN: 14 mg/dL (ref 8–23)
CO2: 29 mmol/L (ref 22–32)
Calcium: 9.4 mg/dL (ref 8.9–10.3)
Chloride: 104 mmol/L (ref 98–111)
Creatinine, Ser: 0.84 mg/dL (ref 0.44–1.00)
GFR, Estimated: >60 mL/min (ref >60)
Glucose, Bld: 107 mg/dL — ABNORMAL HIGH (ref 70–99)
Potassium: 3.9 mmol/L (ref 3.5–5.1)
Sodium: 141 mmol/L (ref 135–145)

## 2020-07-06 LAB — CBC
HCT: 43.3 % (ref 36.0–46.0)
Hemoglobin: 14.3 g/dL (ref 12.0–15.0)
MCH: 32.3 pg (ref 26.0–34.0)
MCHC: 33 g/dL (ref 30.0–36.0)
MCV: 97.7 fL (ref 80.0–100.0)
Platelets: 268 10*3/uL (ref 150–400)
RBC: 4.43 MIL/uL (ref 3.87–5.11)
RDW: 11.9 % (ref 11.5–15.5)
WBC: 5.8 10*3/uL (ref 4.0–10.5)
nRBC: 0 % (ref 0.0–0.2)

## 2020-07-06 LAB — CBG MONITORING, ED: Glucose-Capillary: 102 mg/dL — ABNORMAL HIGH (ref 70–99)

## 2020-07-06 MED ORDER — FOSFOMYCIN TROMETHAMINE 3 G PO PACK
3.0000 g | PACK | Freq: Once | ORAL | Status: AC
  Administered 2020-07-06: 3 g via ORAL
  Filled 2020-07-06: qty 3

## 2020-07-06 MED ORDER — CEPHALEXIN 500 MG PO CAPS: 500.0000 mg | ORAL_CAPSULE | Freq: Three times a day (TID) | ORAL | 0 refills | Status: DC

## 2020-07-06 MED ORDER — CEPHALEXIN 250 MG PO CAPS: 250.0000 mg | ORAL_CAPSULE | Freq: Three times a day (TID) | ORAL | 0 refills | Status: AC

## 2020-07-06 NOTE — ED Provider Notes (Signed)
[facility] Emergency Department Provider Note  Time seen: 10:52 PM  I have reviewed the triage vital signs and the nursing notes.   HISTORY  Chief Complaint Headache and Dizziness   HPI Kathy Bean is a 70 y.o. female with a past medical history of depression, hypertension, Parkinson's, presents to the emergency department for headache and feeling off balance.  According to the patient for the past 3 days now she has been experiencing intermittent pain to her right head.  States the pain is a shooting type pain that will last approximately 10 seconds and then resolve on its own.  States that occurred twice on Wednesday once on Thursday once on Friday and it has not occurred today.  Patient denies any headache currently.  States she felt a little off balance this morning which concerned her so she came to the emergency department for evaluation.  Currently the patient appears well.  She states her main concern is that strokes run in her family although she has not had one personally.  Patient denies any weakness or numbness of any arm or leg confusion or difficulty speaking or thinking.   Past Medical History:  Diagnosis Date  . Breast cancer ([place]) 11/2014   left  . Cancer [facility]) 2016   left breast  . Depression   . Dysrhythmia    heart racing  . Family history of adverse reaction to anesthesia    brother had difficulty going under anesthseia  . Headache    hx of migraines   period induced  . Hypertension   . Osteoarthritis   . Parkinson disease ([place])   . Personal history of radiation therapy     Patient Active Problem List   Diagnosis Date Noted  . Chronic anemia 08/09/2018  . External hemorrhoids   . Hypertrophy of anal papillae   . Loss of weight   . Diverticulosis of large intestine without diverticulitis   . Dehydration 07/15/2018  . Parkinson's disease ([place]) 04/18/2018  . Hyperlipidemia, mixed 10/27/2016  . Benign essential hypertension  06/11/2016  . Malignant neoplasm of central portion of left female breast ([place]) 04/16/2015  . Primary cancer of lower-inner quadrant of left female breast ([place]) 12/04/2014  . Acquired spondylolisthesis 10/11/2013  . B12 deficiency 10/02/2013  . Lumbar disc disease 09/07/2013    Past Surgical History:  Procedure Laterality Date  . BREAST BIOPSY Left 10/2014   positive  . BREAST LUMPECTOMY Left 2016   invasive mammary  . COLONOSCOPY Left 07/17/2018   Procedure: COLONOSCOPY;  Surgeon: Virgel Manifold, MD;  Location: [facility] ENDOSCOPY;  Service: Endoscopy;  Laterality: Left;  . DILATION AND CURETTAGE OF UTERUS    . ESOPHAGOGASTRODUODENOSCOPY Left 07/17/2018   Procedure: ESOPHAGOGASTRODUODENOSCOPY (EGD);  Surgeon: Virgel Manifold, MD;  Location[facility] ENDOSCOPY;  Service: Endoscopy;  Laterality: Left;  . Laminectomy Posterior Cervicle Decomp W/Facetectomy & Foraminotomy     [id number] x 3...Marland KitchenMarland KitchenEXCISION CENTRAL LEFT [place] HERNIATION L 2-3; Surgeon: Loni Dolly, MD; Location: [place]; Service: Orthopedics; Laterality: Left  . Laminectomy Posterior Lumbar Facetectomy & Foraminotomy W/Decomp     BILATERAL LUMBAR DECOMPRESSION L2-3, L3-4, L4-5; Surgeon: Loni Dolly, MD; Location: [place]; Service: Orthopedics; Laterality: Bilateral;  . PARTIAL MASTECTOMY WITH NEEDLE LOCALIZATION Left 11/29/2014   Procedure: PARTIAL MASTECTOMY WITH NEEDLE LOCALIZATION;  Surgeon: Leonie Green, MD;  Location: [facility] ORS;  Service: General;  Laterality: Left;  . SENTINEL NODE BIOPSY Left 11/29/2014   Procedure: SENTINEL NODE BIOPSY;  Surgeon: Delfina Redwood  Rochel Brome, MD;  Location: [facility] ORS;  Service: General;  Laterality: Left;    Prior to Admission medications   Medication Sig Start Date End Date Taking? Authorizing Provider  anastrozole (ARIMIDEX) 1 MG tablet TAKE 1 TABLET BY MOUTH EVERY DAY 04/15/19   Lloyd Huger, MD  calcium carbonate (TUMS EX) 750 MG chewable tablet Chew 1 tablet by mouth 3  (three) times daily.    [provider]  carbidopa-levodopa (SINEMET IR) 25-100 MG tablet Take 2 tablets by mouth 3 (three) times daily.     [provider]  citalopram (CELEXA) 20 MG tablet Take 1 tablet by mouth 1 day or 1 dose. 08/01/18   [provider]  Cyanocobalamin (RA VITAMIN B-12 TR) 1000 MCG TBCR Take 1,000 mcg by mouth daily.     [provider]  entacapone (COMTAN) 200 MG tablet Take 200 mg by mouth 2 (two) times daily.    [provider]  famotidine (PEPCID) 20 MG tablet TAKE 1 TABLET BY MOUTH EVERYDAY AT BEDTIME 02/13/19   Vonda Antigua B, MD  potassium chloride (K-DUR) 10 MEQ tablet Take 10 mEq by mouth 2 (two) times daily.     [provider]  triamterene-hydrochlorothiazide (MAXZIDE-25) 37.5-25 MG tablet Take 2 tablets by mouth daily.     [provider]    No Known Allergies  Family History  Problem Relation Age of Onset  . Breast cancer Maternal Aunt   . Breast cancer Paternal Aunt   . Coronary artery disease Mother   . Heart attack Mother   . Coronary artery disease Paternal Grandmother     Social History Social History   Tobacco Use  . Smoking status: Former Smoker    Quit date: 11/22/1999    Years since quitting: 20.6  . Smokeless tobacco: Never Used  Substance Use Topics  . Alcohol use: Yes    Alcohol/week: 14.0 standard drinks    Types: 14 Glasses of wine per week  . Drug use: No    Review of Systems Constitutional: Negative for fever. Cardiovascular: Negative for chest pain. Respiratory: Negative for shortness of breath. Gastrointestinal: Negative for abdominal pain, vomiting and diarrhea. Musculoskeletal: Negative for musculoskeletal complaints Neurological: Negative for headache All other ROS negative  ____________________________________________   PHYSICAL EXAM:  VITAL SIGNS: ED Triage Vitals  Enc Vitals Group     BP 07/06/20 2113 (!) 138/94     Pulse Rate 07/06/20 2113  81     Resp 07/06/20 2113 (!) 22     Temp 07/06/20 2113 98.3 F (36.8 C)     Temp Source 07/06/20 2113 Oral     SpO2 07/06/20 2113 98 %     Weight 07/06/20 2116 175 lb (79.4 kg)     Height 07/06/20 2116 5\' 7"  (1.702 m)     Head Circumference --      Peak Flow --      Pain Score 07/06/20 2115 9     Pain Loc --      Pain Edu? --      Excl. in [place]? --    Constitutional: Alert and oriented. Well appearing and in no distress. Eyes: Normal exam ENT      Head: Normocephalic and atraumatic.      Mouth/Throat: Mucous membranes are moist. Cardiovascular: Normal rate, regular rhythm.  Respiratory: Normal respiratory effort without tachypnea nor retractions. Breath sounds are clear Gastrointestinal: Soft and nontender. No distention. Musculoskeletal: Nontender with normal range of motion in all extremities.  Neurologic:  Normal speech and language. No gross focal neurologic deficits.  Equal grip strength bilaterally.  No pronator drift.  5/5 motor in all extremities.  Cranial nerves intact. Skin:  Skin is warm, dry and intact.  Psychiatric: Mood and affect are normal.   ____________________________________________    EKG  EKG viewed and interpreted by myself shows a normal sinus rhythm 84 bpm with a narrow QRS, left axis deviation, largely normal intervals, no concerning ST changes.  ____________________________________________    RADIOLOGY  CT scan head is negative  ____________________________________________   INITIAL IMPRESSION / ASSESSMENT AND PLAN / ED COURSE  Pertinent labs & imaging results that were available during my care of the patient were reviewed by me and considered in my medical decision making (see chart for details).   Patient presents to the emergency department for intermittent headache lasting approximately 10 seconds at a time has occurred a total of 4 times over the past 3 days.  No headache currently.  Overall the patient appears extremely well.  Given the  new headache we will obtain a CT scan of the head as a precaution to rule out small ICH.  Patient's lab work is reassuring.  We will check a urine sample and reassess.  Overall patient appears quite well.  Urine consistent with UTI.  We will discharge with antibiotics have the patient follow-up with her doctor.  Overall patient appears well.  CT scan is negative.  Mishka Stegemann Cadotte was evaluated in Emergency Department on 07/06/2020 for the symptoms described in the history of present illness. She was evaluated in the context of the global COVID-19 pandemic, which necessitated consideration that the patient might be at risk for infection with the SARS-CoV-2 virus that causes COVID-19. Institutional protocols and algorithms that pertain to the evaluation of patients at risk for COVID-19 are in a state of rapid change based on information released by regulatory bodies including the CDC and federal and state organizations. These policies and algorithms were followed during the patient's care in the ED.  ____________________________________________   FINAL CLINICAL IMPRESSION(S) / ED DIAGNOSES  Headache UTI   Harvest Dark, MD 07/06/20 2343

## 2020-07-06 NOTE — ED Notes (Addendum)
Pt ambulating to toilet with husband's assistance, instructed on hat for UA sample- pt verbalizes understanding.

## 2020-07-06 NOTE — ED Triage Notes (Signed)
Pt is now reporting that when she looked at the TV screen the words were moving across the screen

## 2020-07-06 NOTE — ED Triage Notes (Signed)
Pt reports that she developed a headache on the right side behind her temple that last 10 to 11 seconds then disappears. She describes it has an intense pain. She reports that she has been off balance when she gets up and moves around.

## 2020-07-09 ENCOUNTER — Ambulatory Visit: Payer: Medicare Other | Admitting: Occupational Therapy

## 2020-07-09 ENCOUNTER — Other Ambulatory Visit: Payer: Self-pay

## 2020-07-09 ENCOUNTER — Encounter: Payer: Self-pay | Admitting: Occupational Therapy

## 2020-07-09 ENCOUNTER — Ambulatory Visit: Payer: Medicare Other | Admitting: Speech Pathology

## 2020-07-09 DIAGNOSIS — R262 Difficulty in walking, not elsewhere classified: Secondary | ICD-10-CM

## 2020-07-09 DIAGNOSIS — R2681 Unsteadiness on feet: Secondary | ICD-10-CM

## 2020-07-09 DIAGNOSIS — M6281 Muscle weakness (generalized): Secondary | ICD-10-CM | POA: Diagnosis not present

## 2020-07-09 DIAGNOSIS — R471 Dysarthria and anarthria: Secondary | ICD-10-CM

## 2020-07-09 DIAGNOSIS — G20 Parkinson's disease: Secondary | ICD-10-CM

## 2020-07-09 DIAGNOSIS — R278 Other lack of coordination: Secondary | ICD-10-CM

## 2020-07-09 NOTE — Therapy (Signed)
[place] MAIN [facility] SERVICES [address] [place], [place], [address] Phone: [phone]   Fax:  [phone]  Occupational Therapy Treatment  Patient Details  Name: Kathy Bean MRN: [id number] Date of Birth: November 27, 1950 No data recorded  Encounter Date: 07/09/2020   OT End of Session - 07/09/20 1624    Visit Number 14    Number of Visits 17    Date for OT Re-Evaluation 07/19/20    Authorization Type Progress reporting period starting 07/01/2020    OT Start Time 1302    OT Stop Time 1400    OT Time Calculation (min) 58 min    Activity Tolerance Patient tolerated treatment well    Behavior During Therapy [facility] for tasks assessed/performed           Past Medical History:  Diagnosis Date  . Breast cancer ([place]) 11/2014   left  . Cancer [facility]) 2016   left breast  . Depression   . Dysrhythmia    heart racing  . Family history of adverse reaction to anesthesia    brother had difficulty going under anesthseia  . Headache    hx of migraines   period induced  . Hypertension   . Osteoarthritis   . Parkinson disease ([place])   . Personal history of radiation therapy     Past Surgical History:  Procedure Laterality Date  . BREAST BIOPSY Left 10/2014   positive  . BREAST LUMPECTOMY Left 2016   invasive mammary  . COLONOSCOPY Left 07/17/2018   Procedure: COLONOSCOPY;  Surgeon: Virgel Manifold, MD;  Location: [facility] ENDOSCOPY;  Service: Endoscopy;  Laterality: Left;  . DILATION AND CURETTAGE OF UTERUS    . ESOPHAGOGASTRODUODENOSCOPY Left 07/17/2018   Procedure: ESOPHAGOGASTRODUODENOSCOPY (EGD);  Surgeon: Virgel Manifold, MD;  Location: [facility] ENDOSCOPY;  Service: Endoscopy;  Laterality: Left;  . Laminectomy Posterior Cervicle Decomp W/Facetectomy & Foraminotomy     [id number] x 3...Marland KitchenMarland KitchenEXCISION CENTRAL LEFT [place] HERNIATION L 2-3; Surgeon: Loni Dolly, MD; Location: [place]; Service: Orthopedics; Laterality: Left  . Laminectomy Posterior  Lumbar Facetectomy & Foraminotomy W/Decomp     BILATERAL LUMBAR DECOMPRESSION L2-3, L3-4, L4-5; Surgeon: Loni Dolly, MD; Location: [place]; Service: Orthopedics; Laterality: Bilateral;  . PARTIAL MASTECTOMY WITH NEEDLE LOCALIZATION Left 11/29/2014   Procedure: PARTIAL MASTECTOMY WITH NEEDLE LOCALIZATION;  Surgeon: Leonie Green, MD;  Location: [facility] ORS;  Service: General;  Laterality: Left;  . SENTINEL NODE BIOPSY Left 11/29/2014   Procedure: SENTINEL NODE BIOPSY;  Surgeon: Leonie Green, MD;  Location: [facility] ORS;  Service: General;  Laterality: Left;    There were no vitals filed for this visit.   Subjective Assessment - 07/09/20 1623    Subjective  Pt reports she had a bad weekend, ended up going to the ER and found out she had a UTI.    Pertinent History Pt was diagnosed with Parkinson's since July 2015.   Pt reports decline in strength in legs, walking, feels "wobble".  Reports feeling fuzzy at times, has some tremors right side.    Patient Stated Goals Pt would like to be as independent as possible, be more stable.    Currently in Pain? No/denies    Pain Score 0-No pain           Maximal Daily Exercises performed in STANDARDVERSION (with assist from therapist)  Patient seen for initial instruction of LSVT BIG exercises: LSVT Daily Session Maximal Daily Exercises: Sustained movements are designed to rescale the  amplitude of movement output for generalization to daily functional activities. Performed as follows for 1 set of 10 repetitions each: Multi directional sustained movements- 1) Floor to ceiling, 2) Side to side. Multi directional Repetitive movements performed in standing and are designed to provide retraining effort needed for sustained muscle activation in tasks Performed as follows: 3) Step and reach forward, 4) Step and Reach Backwards, 5) Step and reach sideways, 6) Rock and reach forward/backward, 7) Rock and reach sideways(eliminated due to back  pain)  Min guard for exercises this date, continued cues for increasing movements in BIG fashion, continues to work on calibration of movement patterns.    Functional component tasks: Reaching to behind the head with each arm, cues for BIG hand movements Sit to stand from mat table on lowest setting with cues for weight shift, technique andmin assistfor 10 reps for 1 set, patient able to demonstrate improved weight shift forwards Manipulation of buttons, 2 types, plastic and metal.  Hand flicks performed prior to task.   7 hand exercises to help promote handwriting, dexterity and flexibility. 1) Fisting with arm then arm extension with fingers extended with BIG hand movements 2) oppositional movements of the thumb to each digit 3) wrist flex/ext with elbows extended 4) supination/pronation 5) tendon gliding with hook fist and then finger extension 6) tendon gliding with tabletop movement of fingers with MP flexion, PIP and DIP extension 7) MP flexion, PIP flexion, DIP extension Performed with therapist demo and occasional cues for form  Functional mobility with rollator for one trial of 250 feet with min guard and cues.  Response to tx: Pt has continued to make great progress with performance of exercises, still performing in adapted version at home but able to perform in standard version in the clinic with therapist assist.  No loss of balance except with stepping backwards and required assist with this today.  Will continue to work towards calibration of movements and moving towards performing in a standard version consistently to see if she can progress with this at home.  Functional mobility with use of rollator and cues for posture.  Continue to work towards goals in plan of care to increase independence and safety with necessary daily tasks.                       OT Education - 07/09/20 1624    Education Details maximal daily exercises, functional  mobility, amplitude of steps    Person(s) Educated Patient;Spouse    Methods Explanation;Demonstration;Verbal cues    Comprehension Verbalized understanding;Returned demonstration               OT Long Term Goals - 07/01/20 1318      OT LONG TERM GOAL #1   Title Patient will improve gait speed and endurance to be able to ambulate 600 feet in 6 minutes to negotiate around the home and community safely in 4 weeks.    Baseline 07/01/2020: 575 feet; Eval: 425 feet at eval    Time 4    Status On-going    Target Date 07/19/20      OT LONG TERM GOAL #2   Title Patient will complete HEP for maximal daily exercises with modified independence in 4 weeks.    Baseline Pt. currently using the adaptive version of the MDEs.    Time 4    Period Weeks    Status On-going    Target Date 07/19/20      OT LONG TERM  GOAL #3   Title Patient will transfer from to sit to stand without the use of arms safely and independently from a variety of chairs/surfaces in 4 weeks.    Baseline 07/01/2020: Pt. unable even with rocking for momentum. Pt. unable at evaluation    Time 4    Period Weeks    Status New    Target Date 07/19/20      OT LONG TERM GOAL #4   Title Pt will complete toilet transfers with modified independence    Baseline Pt. continues to require occasional assist from husband    Time 4    Period Weeks    Status On-going    Target Date 07/19/20      OT LONG TERM GOAL #5   Title Pt will improve ROM in UE to complete hair care with occasional min assist    Baseline mod to maxA    Time 4    Period Weeks    Status On-going    Target Date 07/19/20      OT LONG TERM GOAL #6   Title Patient will don and doff socks with modified independence    Baseline Pt. continues to have difficulty    Time 4    Period Weeks    Status On-going    Target Date 07/19/20                 Plan - 07/09/20 1625    Clinical Impression Statement Pt has continued to make great progress with  performance of exercises, still performing in adapted version at home but able to perform in standard version in the clinic with therapist assist.  No loss of balance except with stepping backwards and required assist with this today.  Will continue to work towards calibration of movements and moving towards performing in a standard version consistently to see if she can progress with this at home.  Functional mobility with use of rollator and cues for posture.  Continue to work towards goals in plan of care to increase independence and safety with necessary daily tasks.    OT Occupational Profile and History Detailed Assessment- Review of Records and additional review of physical, cognitive, psychosocial history related to current functional performance    Occupational performance deficits (Please refer to evaluation for details): ADL's;IADL's;Leisure    Body Structure / Function / Physical Skills ADL;Dexterity;Flexibility;ROM;Strength;Balance;Coordination;FMC;IADL;Body mechanics;Endurance;Gait;Pain;UE functional use;GMC;Mobility    Geophysicist/field seismologist;Sequencing    Psychosocial Skills Environmental  Adaptations;Habits;Routines and Behaviors    Rehab Potential Good    Clinical Decision Making Several treatment options, min-mod task modification necessary    Comorbidities Affecting Occupational Performance: May have comorbidities impacting occupational performance    Modification or Assistance to Complete Evaluation  No modification of tasks or assist necessary to complete eval    OT Frequency 4x / week    OT Duration 4 weeks    OT Treatment/Interventions Self-care/ADL training;Cryotherapy;Therapeutic exercise;DME and/or AE instruction;Functional Mobility Training;Balance training;Neuromuscular education;Gait Training;Manual Therapy;Moist Heat;Contrast Bath;Passive range of motion;Therapeutic activities;Patient/family education    Consulted and Agree with Plan of Care Patient;Family  member/caregiver    Family Member Consulted husband at beginning of the session, daughter arrived 1/2 way into the session           Patient will benefit from skilled therapeutic intervention in order to improve the following deficits and impairments:   Body Structure / Function / Physical Skills: ADL,Dexterity,Flexibility,ROM,Strength,Balance,Coordination,FMC,IADL,Body mechanics,Endurance,Gait,Pain,UE functional use,GMC,Mobility Cognitive Skills: Attention,Memory,Sequencing Psychosocial Skills: Environmental  Adaptations,Habits,Routines and Behaviors  Visit Diagnosis: Muscle weakness (generalized)  Other lack of coordination  Unsteadiness on feet  Difficulty in walking, not elsewhere classified  Parkinson's disease [facility])    Problem List Patient Active Problem List   Diagnosis Date Noted  . Chronic anemia 08/09/2018  . External hemorrhoids   . Hypertrophy of anal papillae   . Loss of weight   . Diverticulosis of large intestine without diverticulitis   . Dehydration 07/15/2018  . Parkinson's disease ([place]) 04/18/2018  . Hyperlipidemia, mixed 10/27/2016  . Benign essential hypertension 06/11/2016  . 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 Primary cancer of lower-inner quadrant of left female breast ([place]) 12/04/2014  . Acquired spondylolisthesis 10/11/2013  . B12 deficiency 10/02/2013  . Lumbar disc disease 09/07/2013   Achilles Dunk, OTR/L, CLT  Lawrence Mitch 07/09/2020, 5:25 PM  [place] MAIN [facility] SERVICES [address] [place], [place], [address] Phone: [phone]   Fax:  [phone]  Name: LORMA HEATER MRN: [id number] Date of Birth: 04-14-1950

## 2020-07-09 NOTE — Therapy (Signed)
[place] MAIN [facility] SERVICES [address] [place], [place], [address] Phone: [phone]   Fax:  [phone]  Speech Language Pathology Treatment and Progress Note  Patient Details  Name: Kathy Bean MRN: [id number] Date of Birth: 1950-09-26 Referring Provider (SLP): Dr. Jennings Books   Encounter Date: 07/09/2020  Speech Therapy Progress Note  Dates of Reporting Period: 06/10/20 to 07/09/20  Objective: Patient has been seen for 10 speech therapy sessions this reporting period targeting dysarthria. Patient is making progress toward LTGs and met 3/3 STGs this reporting period. See skilled intervention, clinical impressions, and goals below for details.     End of Session - 07/09/20 1529    Visit Number 10    Number of Visits 17    Date for SLP Re-Evaluation 09/08/20    Authorization Type Medicare    Authorization - Visit Number 10    Progress Note Due on Visit 10    SLP Start Time 1400    SLP Stop Time  1500    SLP Time Calculation (min) 60 min    Activity Tolerance Patient tolerated treatment well           Past Medical History:  Diagnosis Date  . Breast cancer ([place]) 11/2014   left  . Cancer [facility]) 2016   left breast  . Depression   . Dysrhythmia    heart racing  . Family history of adverse reaction to anesthesia    brother had difficulty going under anesthseia  . Headache    hx of migraines   period induced  . Hypertension   . Osteoarthritis   . Parkinson disease ([place])   . Personal history of radiation therapy     Past Surgical History:  Procedure Laterality Date  . BREAST BIOPSY Left 10/2014   positive  . BREAST LUMPECTOMY Left 2016   invasive mammary  . COLONOSCOPY Left 07/17/2018   Procedure: COLONOSCOPY;  Surgeon: Virgel Manifold, MD;  Location: [facility] ENDOSCOPY;  Service: Endoscopy;  Laterality: Left;  . DILATION AND CURETTAGE OF UTERUS    . ESOPHAGOGASTRODUODENOSCOPY Left 07/17/2018   Procedure:  ESOPHAGOGASTRODUODENOSCOPY (EGD);  Surgeon: Virgel Manifold, MD;  Location: [facility] ENDOSCOPY;  Service: Endoscopy;  Laterality: Left;  . Laminectomy Posterior Cervicle Decomp W/Facetectomy & Foraminotomy     [id number] x 3...Marland KitchenMarland KitchenEXCISION CENTRAL LEFT [place] HERNIATION L 2-3; Surgeon: Loni Dolly, MD; Location: [place]; Service: Orthopedics; Laterality: Left  . Laminectomy Posterior Lumbar Facetectomy & Foraminotomy W/Decomp     BILATERAL LUMBAR DECOMPRESSION L2-3, L3-4, L4-5; Surgeon: Loni Dolly, MD; Location: [place]; Service: Orthopedics; Laterality: Bilateral;  . PARTIAL MASTECTOMY WITH NEEDLE LOCALIZATION Left 11/29/2014   Procedure: PARTIAL MASTECTOMY WITH NEEDLE LOCALIZATION;  Surgeon: Leonie Green, MD;  Location: [facility] ORS;  Service: General;  Laterality: Left;  . SENTINEL NODE BIOPSY Left 11/29/2014   Procedure: SENTINEL NODE BIOPSY;  Surgeon: Leonie Green, MD;  Location: [facility] ORS;  Service: General;  Laterality: Left;    There were no vitals filed for this visit.   Subjective Assessment - 07/09/20 1512    Subjective "I had a UTI"    Currently in Pain? No/denies                 ADULT SLP TREATMENT - 07/09/20 1513      General Information   Behavior/Cognition Alert;Cooperative    HPI [name] is a 70 y.o. female referred by Dr. 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 Per chart  review pt with hypophonia, dysphagia      Treatment Provided   Treatment provided Cognitive-Linquistic      Pain Assessment   Pain Assessment No/denies pain      Cognitive-Linquistic Treatment   Treatment focused on Dysarthria;Patient/family/caregiver education    Skilled Treatment LSVT Daily tasks: Loud /a/ task averaged 85 dB, with a duration average of 8 seconds. Patient completed high and low pitch tasks with minimal cues for pitch (modeled lower pitch to reduce strain). Patient's max F0 for pitch glides was 375 Hz (84 dB avg). Patient's min F0 for low  glides was 184 Hz (84 dB avg); Functional phrases averaged 86 dB. Paragraph level reading task with initial model for rate/loudness, occasional min cues for vocal fade, average 85 dB. Carryover to conversation for periods of 5-8 minutes between tasks average 84 dB with min cues.      Assessment / Recommendations / Plan   Plan Continue with current plan of care      Progression Toward Goals   Progression toward goals Progressing toward goals            SLP Education - 07/09/20 1528    Education Details more effort improves vocal quality    Person(s) Educated Patient    Methods Explanation    Comprehension Verbalized understanding            SLP Short Term Goals - 07/09/20 1530      SLP SHORT TERM GOAL #1   Title The patient will complete Daily Tasks (Maximum duration "ah", High/Lows, and Functional Phrases) at average loudness >/= 85 dB and with loud, good quality voice with min cues.    Time 10   sessions   Status Achieved      SLP SHORT TERM GOAL #2   Title The patient will complete Hierarchal Speech Loudness reading drills (words/phrases, sentences) at average >/= 75 dB and with loud, good quality voice with min cues.    Time 10   sessions   Period Weeks    Status Achieved      SLP SHORT TERM GOAL #3   Title The patient will participate in 5-8 minutes conversation, maintaining average loudness of 75 dB and loud, good quality voice with min cues.    Time 10   sessions   Status Achieved            SLP Long Term Goals - 07/09/20 1531      SLP LONG TERM GOAL #1   Title The patient will complete Daily Tasks (Maximum duration "ah", High/Lows, and Functional Phrases) at average loudness >/= 85 dB and with loud, good quality voice.    Time 4   or 17 sessions, for all LTGs   Period Weeks    Status On-going      SLP LONG TERM GOAL #2   Title The patient will complete Hierarchal Speech Loudness reading drills (words/phrases, sentences, and paragraph) at average >/= 75 dB  and with loud, good quality voice.    Time 4    Period Weeks    Status On-going      SLP LONG TERM GOAL #3   Title The patient will participate in 15-20 minutes conversation, maintaining average loudness of >/= 75 dB and loud, good quality voice.    Time 4    Period Weeks    Status On-going      SLP LONG TERM GOAL #4   Title Patient will report improved communication effectiveness as measured by Communicative  Effectiveness Survey.    Baseline 21/32 on 06/10/2020, higher score = "More effective"    Time 4    Period Weeks    Status On-going      SLP LONG TERM GOAL #5   Title Patient will participate in Modified Barium Swallow study for assessment of swallow function if clinically indicated.    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 Patient continues to consume liquids without overt signs of aspiration; will continue to monitor. Patient able to progress to paragraph and multi-paragraph reading however requires modeling cues to slow rate to maintain loudness and vocal quality. Patient is beginning to carryover loudness during short conversation. She has met 3/3 short term goals and is progressing toward LTGs. I recommend skilled ST for LSVT-LOUD treatment 4 days a week x4 weeks to improve vocal quality and ability to communicate with family and friends in the community.    Speech Therapy Frequency 4x / week    Duration 4 weeks    Treatment/Interventions Aspiration precaution training;SLP instruction and feedback;Functional tasks;Patient/family education;Cueing hierarchy;Other (comment)   LSVT-LOUD   Potential to Achieve Goals Good    SLP Home Exercise Plan LSVT LOUD homework provided    Consulted and Agree with Plan of Care Patient;Family member/caregiver           Patient will benefit from skilled therapeutic intervention in  order to improve the following deficits and impairments:   Dysarthria and anarthria  Parkinson's disease [facility])    Problem List Patient Active Problem List   Diagnosis Date Noted  . Chronic anemia 08/09/2018  . External hemorrhoids   . Hypertrophy of anal papillae   . Loss of weight   . Diverticulosis of large intestine without diverticulitis   . Dehydration 07/15/2018  . Parkinson's disease ([place]) 04/18/2018  . Hyperlipidemia, mixed 10/27/2016  . Benign essential hypertension 06/11/2016  . 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 Primary cancer of lower-inner quadrant of left female breast ([place]) 12/04/2014  . Acquired spondylolisthesis 10/11/2013  . B12 deficiency 10/02/2013  . Lumbar disc disease 09/07/2013   Deneise Lever, [place], [place] Speech-Language Pathologist  Aliene Altes 07/09/2020, 3:34 PM  [place] MAIN [facility] SERVICES [address] [place], [place], [address] Phone: [phone]   Fax:  [phone]   Name: ROBERTO HLAVATY MRN: [id number] Date of Birth: 06/29/1950

## 2020-07-10 ENCOUNTER — Ambulatory Visit: Payer: Medicare Other | Admitting: Speech Pathology

## 2020-07-10 ENCOUNTER — Ambulatory Visit: Payer: Medicare Other | Attending: Neurology | Admitting: Occupational Therapy

## 2020-07-10 DIAGNOSIS — G20 Parkinson's disease: Secondary | ICD-10-CM | POA: Insufficient documentation

## 2020-07-10 DIAGNOSIS — R2681 Unsteadiness on feet: Secondary | ICD-10-CM | POA: Insufficient documentation

## 2020-07-10 DIAGNOSIS — R262 Difficulty in walking, not elsewhere classified: Secondary | ICD-10-CM | POA: Diagnosis present

## 2020-07-10 DIAGNOSIS — R471 Dysarthria and anarthria: Secondary | ICD-10-CM | POA: Diagnosis present

## 2020-07-10 DIAGNOSIS — R278 Other lack of coordination: Secondary | ICD-10-CM | POA: Insufficient documentation

## 2020-07-10 DIAGNOSIS — M6281 Muscle weakness (generalized): Secondary | ICD-10-CM | POA: Diagnosis present

## 2020-07-10 NOTE — Therapy (Signed)
[place] MAIN [facility] SERVICES [address] [place], [place], [address] Phone: [phone]   Fax:  [phone]  Speech Language Pathology Treatment  Patient Details  Name: Kathy Bean MRN: [id number] Date of Birth: 23-Aug-1950 Referring Provider (SLP): Dr. Jennings Books   Encounter Date: 07/10/2020   End of Session - 07/10/20 1536    Visit Number 11    Number of Visits 17    Date for SLP Re-Evaluation 09/08/20    Authorization Type Medicare    Authorization - Visit Number 11    Progress Note Due on Visit 10    SLP Start Time 1400    SLP Stop Time  1500    SLP Time Calculation (min) 60 min    Activity Tolerance Patient tolerated treatment well           Past Medical History:  Diagnosis Date  . Breast cancer ([place]) 11/2014   left  . Cancer [facility]) 2016   left breast  . Depression   . Dysrhythmia    heart racing  . Family history of adverse reaction to anesthesia    brother had difficulty going under anesthseia  . Headache    hx of migraines   period induced  . Hypertension   . Osteoarthritis   . Parkinson disease ([place])   . Personal history of radiation therapy     Past Surgical History:  Procedure Laterality Date  . BREAST BIOPSY Left 10/2014   positive  . BREAST LUMPECTOMY Left 2016   invasive mammary  . COLONOSCOPY Left 07/17/2018   Procedure: COLONOSCOPY;  Surgeon: Virgel Manifold, MD;  Location: [facility] ENDOSCOPY;  Service: Endoscopy;  Laterality: Left;  . DILATION AND CURETTAGE OF UTERUS    . ESOPHAGOGASTRODUODENOSCOPY Left 07/17/2018   Procedure: ESOPHAGOGASTRODUODENOSCOPY (EGD);  Surgeon: Virgel Manifold, MD;  Location: [facility] ENDOSCOPY;  Service: Endoscopy;  Laterality: Left;  . Laminectomy Posterior Cervicle Decomp W/Facetectomy & Foraminotomy     [id number] x 3...Marland KitchenMarland KitchenEXCISION CENTRAL LEFT [place] HERNIATION L 2-3; Surgeon: Loni Dolly, MD; Location: [place]; Service: Orthopedics; Laterality: Left  . Laminectomy  Posterior Lumbar Facetectomy & Foraminotomy W/Decomp     BILATERAL LUMBAR DECOMPRESSION L2-3, L3-4, L4-5; Surgeon: Loni Dolly, MD; Location: [place]; Service: Orthopedics; Laterality: Bilateral;  . PARTIAL MASTECTOMY WITH NEEDLE LOCALIZATION Left 11/29/2014   Procedure: PARTIAL MASTECTOMY WITH NEEDLE LOCALIZATION;  Surgeon: Leonie Green, MD;  Location: [facility] ORS;  Service: General;  Laterality: Left;  . SENTINEL NODE BIOPSY Left 11/29/2014   Procedure: SENTINEL NODE BIOPSY;  Surgeon: Leonie Green, MD;  Location: [facility] ORS;  Service: General;  Laterality: Left;    There were no vitals filed for this visit.   Subjective Assessment - 07/10/20 1514    Subjective " I feel a little tired/achy today."    Currently in Pain? No/denies                 ADULT SLP TREATMENT - 07/10/20 0001      General Information   Behavior/Cognition Alert;Cooperative    HPI Makaylen Thieme is a 70 y.o. female referred by Dr. 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 Per chart review pt with hypophonia, dysphagia      Treatment Provided   Treatment provided Cognitive-Linquistic      Pain Assessment   Pain Assessment No/denies pain      Cognitive-Linquistic Treatment   Treatment focused on Dysarthria;Patient/family/caregiver education    Skilled Treatment LSVT Daily tasks: Loud /a/  task averaged 84 dB, with a duration average of 7 seconds. Patient completed high and low pitch tasks. Patient's max F0 for high pitch glides was 387 Hz (85 dB avg). Patient's min F0 for low glides was 192 Hz (84 dB avg); Patient completed functional phrases task, averaging 86 dB. Paragraph level reading task averaged 84 dB. Carryover to conversation for periods of 5-8 minutes between tasks and spontaneous responses to questions, averaged 83 dB with minimial cues for loudness due to occasional dips in vocal intensity.      Assessment / Recommendations / Plan   Plan Continue with current plan of care       Progression Toward Goals   Progression toward goals Progressing toward goals            SLP Education - 07/10/20 1535    Education Details breath support during speech tasks    Person(s) Educated Patient    Methods Explanation    Comprehension Verbalized understanding            SLP Short Term Goals - 07/09/20 1530      SLP SHORT TERM GOAL #1   Title The patient will complete Daily Tasks (Maximum duration "ah", High/Lows, and Functional Phrases) at average loudness >/= 85 dB and with loud, good quality voice with min cues.    Time 10   sessions   Status Achieved      SLP SHORT TERM GOAL #2   Title The patient will complete Hierarchal Speech Loudness reading drills (words/phrases, sentences) at average >/= 75 dB and with loud, good quality voice with min cues.    Time 10   sessions   Period Weeks    Status Achieved      SLP SHORT TERM GOAL #3   Title The patient will participate in 5-8 minutes conversation, maintaining average loudness of 75 dB and loud, good quality voice with min cues.    Time 10   sessions   Status Achieved            SLP Long Term Goals - 07/09/20 1531      SLP LONG TERM GOAL #1   Title The patient will complete Daily Tasks (Maximum duration "ah", High/Lows, and Functional Phrases) at average loudness >/= 85 dB and with loud, good quality voice.    Time 4   or 17 sessions, for all LTGs   Period Weeks    Status On-going      SLP LONG TERM GOAL #2   Title The patient will complete Hierarchal Speech Loudness reading drills (words/phrases, sentences, and paragraph) at average >/= 75 dB and with loud, good quality voice.    Time 4    Period Weeks    Status On-going      SLP LONG TERM GOAL #3   Title The patient will participate in 15-20 minutes conversation, maintaining average loudness of >/= 75 dB and loud, good quality voice.    Time 4    Period Weeks    Status On-going      SLP LONG TERM GOAL #4   Title Patient will report improved  communication effectiveness as measured by Communicative Effectiveness Survey.    Baseline 21/32 on 06/10/2020, higher score = "More effective"    Time 4    Period Weeks    Status On-going      SLP LONG TERM GOAL #5   Title Patient will participate in Modified Barium Swallow study for assessment of swallow function if clinically indicated.  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 Patient continues to consume liquids without overt signs of aspiration; will continue to monitor. Patient able is able to self-correct and use breath support when changes in vocal quality occur. Patient is beginning to carryover loudness during short conversation. She has met 3/3 short term goals and is progressing toward LTGs. I recommend skilled ST for LSVT-LOUD treatment 4 days a week x4 weeks to improve vocal quality and ability to communicate with family and friends in the community.    Speech Therapy Frequency 4x / week    Duration 4 weeks    Treatment/Interventions Aspiration precaution training;SLP instruction and feedback;Functional tasks;Patient/family education;Cueing hierarchy;Other (comment)   LSVT-LOUD   Potential to Achieve Goals Good    SLP Home Exercise Plan LSVT LOUD homework provided    Consulted and Agree with Plan of Care Patient;Family member/caregiver           Patient will benefit from skilled therapeutic intervention in order to improve the following deficits and impairments:   Dysarthria and anarthria  Parkinson's disease [facility])    Problem List Patient Active Problem List   Diagnosis Date Noted  . Chronic anemia 08/09/2018  . External hemorrhoids   . Hypertrophy of anal papillae   . Loss of weight   . Diverticulosis of large intestine without diverticulitis   . Dehydration 07/15/2018  . Parkinson's disease ([place])  04/18/2018  . Hyperlipidemia, mixed 10/27/2016  . Benign essential hypertension 06/11/2016  . 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 Primary cancer of lower-inner quadrant of left female breast ([place]) 12/04/2014  . Acquired spondylolisthesis 10/11/2013  . B12 deficiency 10/02/2013  . Lumbar disc disease 09/07/2013   Brooke Dare, SLP Graduate Clinician  Brooke Dare 07/10/2020, 3:42 PM  [place] MAIN [facility] SERVICES [address] [place], [place], [address] Phone: [phone]   Fax:  [phone]   Name: Kathy Bean MRN: [id number] Date of Birth: 11-10-1950

## 2020-07-11 ENCOUNTER — Other Ambulatory Visit: Payer: Self-pay

## 2020-07-11 ENCOUNTER — Ambulatory Visit: Payer: Medicare Other | Admitting: Speech Pathology

## 2020-07-11 ENCOUNTER — Encounter: Payer: Self-pay | Admitting: Occupational Therapy

## 2020-07-11 ENCOUNTER — Ambulatory Visit: Payer: Medicare Other | Admitting: Occupational Therapy

## 2020-07-11 DIAGNOSIS — G20 Parkinson's disease: Secondary | ICD-10-CM

## 2020-07-11 DIAGNOSIS — R2681 Unsteadiness on feet: Secondary | ICD-10-CM

## 2020-07-11 DIAGNOSIS — R262 Difficulty in walking, not elsewhere classified: Secondary | ICD-10-CM

## 2020-07-11 DIAGNOSIS — M6281 Muscle weakness (generalized): Secondary | ICD-10-CM

## 2020-07-11 DIAGNOSIS — R278 Other lack of coordination: Secondary | ICD-10-CM

## 2020-07-11 DIAGNOSIS — R471 Dysarthria and anarthria: Secondary | ICD-10-CM

## 2020-07-11 NOTE — Therapy (Signed)
[place] MAIN [facility] SERVICES [address] [place], [place], [address] Phone: [phone]   Fax:  [phone]  Occupational Therapy Treatment  Patient Details  Name: Kathy Bean MRN: [id number] Date of Birth: 13-Nov-1950 No data recorded  Encounter Date: 07/10/2020   OT End of Session - 07/11/20 1839    Visit Number 15    Number of Visits 17    Date for OT Re-Evaluation 07/19/20    Authorization Type Progress reporting period starting 07/01/2020    OT Start Time 1300    OT Stop Time 1400    OT Time Calculation (min) 60 min    Activity Tolerance Patient tolerated treatment well    Behavior During Therapy [facility] for tasks assessed/performed           Past Medical History:  Diagnosis Date  . Breast cancer ([place]) 11/2014   left  . Cancer [facility]) 2016   left breast  . Depression   . Dysrhythmia    heart racing  . Family history of adverse reaction to anesthesia    brother had difficulty going under anesthseia  . Headache    hx of migraines   period induced  . Hypertension   . Osteoarthritis   . Parkinson disease ([place])   . Personal history of radiation therapy     Past Surgical History:  Procedure Laterality Date  . BREAST BIOPSY Left 10/2014   positive  . BREAST LUMPECTOMY Left 2016   invasive mammary  . COLONOSCOPY Left 07/17/2018   Procedure: COLONOSCOPY;  Surgeon: Virgel Manifold, MD;  Location[facility] ENDOSCOPY;  Service: Endoscopy;  Laterality: Left;  . DILATION AND CURETTAGE OF UTERUS    . ESOPHAGOGASTRODUODENOSCOPY Left 07/17/2018   Procedure: ESOPHAGOGASTRODUODENOSCOPY (EGD);  Surgeon: Virgel Manifold, MD;  Location: [facility] ENDOSCOPY;  Service: Endoscopy;  Laterality: Left;  . Laminectomy Posterior Cervicle Decomp W/Facetectomy & Foraminotomy     [id number] x 3...Marland KitchenMarland KitchenEXCISION CENTRAL LEFT [place] HERNIATION L 2-3; Surgeon: Loni Dolly, MD; Location: [place]; Service: Orthopedics; Laterality: Left  . Laminectomy Posterior  Lumbar Facetectomy & Foraminotomy W/Decomp     BILATERAL LUMBAR DECOMPRESSION L2-3, L3-4, L4-5; Surgeon: Loni Dolly, MD; Location: [place]; Service: Orthopedics; Laterality: Bilateral;  . PARTIAL MASTECTOMY WITH NEEDLE LOCALIZATION Left 11/29/2014   Procedure: PARTIAL MASTECTOMY WITH NEEDLE LOCALIZATION;  Surgeon: Leonie Green, MD;  Location: [facility] ORS;  Service: General;  Laterality: Left;  . SENTINEL NODE BIOPSY Left 11/29/2014   Procedure: SENTINEL NODE BIOPSY;  Surgeon: Leonie Green, MD;  Location: [facility] ORS;  Service: General;  Laterality: Left;    There were no vitals filed for this visit.   Subjective Assessment - 07/11/20 1837    Subjective  Pt reports she is doing well, progressing with healing from UTI but still needs to go to the bathroom frequently.    Pertinent History Pt was diagnosed with Parkinson's since July 2015.   Pt reports decline in strength in legs, walking, feels "wobble".  Reports feeling fuzzy at times, has some tremors right side.    Patient Stated Goals Pt would like to be as independent as possible, be more stable.    Currently in Pain? No/denies    Pain Score 0-No pain    Pain Location Toe (Comment which one)           Maximal Daily Exercises performed in STANDARDVERSION (with assist from therapist)  Patient seen for initial instruction of LSVT BIG exercises: LSVT Daily Session Maximal  Daily Exercises: Sustained movements are designed to rescale the amplitude of movement output for generalization to daily functional activities. Performed as follows for 1 set of 10 repetitions each: Multi directional sustained movements- 1) Floor to ceiling, 2) Side to side. Multi directional Repetitive movements performed in standing and are designed to provide retraining effort needed for sustained muscle activation in tasks Performed as follows: 3) Step and reach forward, 4) Step and Reach Backwards, 5) Step and reach sideways, 6) Rock and reach  forward/backward, 7) Rock and reach sideways(eliminated due to back pain)  Min guard for exercises this date except stepping backwards required min assist for balance, continued cues for increasing movements in BIG fashion, continues to work on calibration of movement patterns.   Functional component tasks: Reaching to behind the head with each arm, cues for BIG hand movements Sit to stand from mat table on lowest setting with cues for weight shift, technique andmin assistfor 10 reps for 1 set, patient able to demonstrate improved weight shift forwards Manipulation of coins from flat surface, picking up and placing into moderate resistance bank.  Hand flicks performed prior to task.   7 hand exercises to help promote handwriting, dexterity and flexibility. 1) Fisting with arm then arm extension with fingers extended with BIG hand movements 2) oppositional movements of the thumb to each digit 3) wrist flex/ext with elbows extended 4) supination/pronation 5) tendon gliding with hook fist and then finger extension 6) tendon gliding with tabletop movement of fingers with MP flexion, PIP and DIP extension 7) MP flexion, PIP flexion, DIP extension Performed with therapist demo and occasional cues for form  Functional mobility with rollator for one trial of 325 feet with min guard and cues.  Toileting twice during session, supervision to and from bathroom with rollator.   Response to tx: Pt continues to work towards transitioning to performing maximal daily exercises in a standard version.  She demonstrated decreased balance with stepping backwards and required min assist to complete in standard version.  Kept chair at her side in case she needed to hold on briefly.  Continues to progress with sit to stand but still needs mat slightly raised to complete all 10 reps.  Will continue to work towards performing from chair height.  Patient continues to demonstrate progress with internal  feedback and recognizing when she is not performing exercise correctly and able to self correct.  Continue to work towards goals in plan of care to increase balance, safety, functional mobility and independence with ADL and IADL tasks at home and in the community.                       OT Education - 07/11/20 1838    Education Details maximal daily exercises in standard version, functional mobility, amplitude of steps    Person(s) Educated Patient;Child(ren)    Methods Explanation;Demonstration;Verbal cues    Comprehension Verbalized understanding;Returned demonstration               OT Long Term Goals - 07/01/20 1318      OT LONG TERM GOAL #1   Title Patient will improve gait speed and endurance to be able to ambulate 600 feet in 6 minutes to negotiate around the home and community safely in 4 weeks.    Baseline 07/01/2020: 575 feet; Eval: 425 feet at eval    Time 4    Status On-going    Target Date 07/19/20      OT LONG TERM  GOAL #2   Title Patient will complete HEP for maximal daily exercises with modified independence in 4 weeks.    Baseline Pt. currently using the adaptive version of the MDEs.    Time 4    Period Weeks    Status On-going    Target Date 07/19/20      OT LONG TERM GOAL #3   Title Patient will transfer from to sit to stand without the use of arms safely and independently from a variety of chairs/surfaces in 4 weeks.    Baseline 07/01/2020: Pt. unable even with rocking for momentum. Pt. unable at evaluation    Time 4    Period Weeks    Status New    Target Date 07/19/20      OT LONG TERM GOAL #4   Title Pt will complete toilet transfers with modified independence    Baseline Pt. continues to require occasional assist from husband    Time 4    Period Weeks    Status On-going    Target Date 07/19/20      OT LONG TERM GOAL #5   Title Pt will improve ROM in UE to complete hair care with occasional min assist    Baseline mod to maxA     Time 4    Period Weeks    Status On-going    Target Date 07/19/20      OT LONG TERM GOAL #6   Title Patient will don and doff socks with modified independence    Baseline Pt. continues to have difficulty    Time 4    Period Weeks    Status On-going    Target Date 07/19/20                 Plan - 07/11/20 1839    Clinical Impression Statement Pt continues to work towards transitioning to performing maximal daily exercises in a standard version.  She demonstrated decreased balance with stepping backwards and required min assist to complete in standard version.  Kept chair at her side in case she needed to hold on briefly.  Continues to progress with sit to stand but still needs mat slightly raised to complete all 10 reps.  Will continue to work towards performing from chair height.  Patient continues to demonstrate progress with internal feedback and recognizing when she is not performing exercise correctly and able to self correct.  Continue to work towards goals in plan of care to increase balance, safety, functional mobility and independence with ADL and IADL tasks at home and in the community.    OT Occupational Profile and History Detailed Assessment- Review of Records and additional review of physical, cognitive, psychosocial history related to current functional performance    Occupational performance deficits (Please refer to evaluation for details): ADL's;IADL's;Leisure    Body Structure / Function / Physical Skills ADL;Dexterity;Flexibility;ROM;Strength;Balance;Coordination;FMC;IADL;Body mechanics;Endurance;Gait;Pain;UE functional use;GMC;Mobility    Geophysicist/field seismologist;Sequencing    Psychosocial Skills Environmental  Adaptations;Habits;Routines and Behaviors    Rehab Potential Good    Clinical Decision Making Several treatment options, min-mod task modification necessary    Comorbidities Affecting Occupational Performance: May have comorbidities impacting  occupational performance    Modification or Assistance to Complete Evaluation  No modification of tasks or assist necessary to complete eval    OT Frequency 4x / week    OT Duration 4 weeks    OT Treatment/Interventions Self-care/ADL training;Cryotherapy;Therapeutic exercise;DME and/or AE instruction;Functional Mobility Training;Balance training;Neuromuscular education;Gait Training;Manual Therapy;Moist Heat;Contrast Bath;Passive range of  motion;Therapeutic activities;Patient/family education    Consulted and Agree with Plan of Care Patient;Family member/caregiver    Family Member Consulted husband at beginning of the session, daughter arrived 1/2 way into the session           Patient will benefit from skilled therapeutic intervention in order to improve the following deficits and impairments:   Body Structure / Function / Physical Skills: ADL,Dexterity,Flexibility,ROM,Strength,Balance,Coordination,FMC,IADL,Body mechanics,Endurance,Gait,Pain,UE functional use,GMC,Mobility Cognitive Skills: Attention,Memory,Sequencing Psychosocial Skills: Environmental  Adaptations,Habits,Routines and Behaviors   Visit Diagnosis: Unsteadiness on feet  Muscle weakness (generalized)  Other lack of coordination  Parkinson's disease ([place])  Difficulty in walking, not elsewhere classified    Problem List Patient Active Problem List   Diagnosis Date Noted  . Chronic anemia 08/09/2018  . External hemorrhoids   . Hypertrophy of anal papillae   . Loss of weight   . Diverticulosis of large intestine without diverticulitis   . Dehydration 07/15/2018  . Parkinson's disease ([place]) 04/18/2018  . Hyperlipidemia, mixed 10/27/2016  . Benign essential hypertension 06/11/2016  . Malignant neoplasm of central portion of left female breast ([place]) 04/16/2015  . Primary cancer of lower-inner quadrant of left female breast ([place]) 12/04/2014  . Acquired spondylolisthesis 10/11/2013  . B12 deficiency 10/02/2013  .  Lumbar disc disease 09/07/2013   Achilles Dunk, OTR/L, CLT  Kerem Gilmer 07/11/2020, 6:56 PM  [place] MAIN [facility] SERVICES [address] [place], [place], [address] Phone: [phone]   Fax:  [phone]  Name: Kathy Bean MRN: [id number] Date of Birth: 1950-03-17

## 2020-07-11 NOTE — Therapy (Signed)
[place] MAIN [facility] SERVICES [address] [place], [place], [address] Phone: [phone]   Fax:  [phone]  Speech Language Pathology Treatment  Patient Details  Name: Kathy Bean MRN: [id number] Date of Birth: February 19, 1950 Referring Provider (SLP): Dr. Jennings Books   Encounter Date: 07/11/2020   End of Session - 07/11/20 1633    Visit Number 12    Number of Visits 17    Date for SLP Re-Evaluation 09/08/20    Authorization Type Medicare    Progress Note Due on Visit 10    SLP Start Time 1400    SLP Stop Time  1500    SLP Time Calculation (min) 60 min    Activity Tolerance Patient tolerated treatment well           Past Medical History:  Diagnosis Date  . Breast cancer ([place]) 11/2014   left  . Cancer [facility]) 2016   left breast  . Depression   . Dysrhythmia    heart racing  . Family history of adverse reaction to anesthesia    brother had difficulty going under anesthseia  . Headache    hx of migraines   period induced  . Hypertension   . Osteoarthritis   . Parkinson disease ([place])   . Personal history of radiation therapy     Past Surgical History:  Procedure Laterality Date  . BREAST BIOPSY Left 10/2014   positive  . BREAST LUMPECTOMY Left 2016   invasive mammary  . COLONOSCOPY Left 07/17/2018   Procedure: COLONOSCOPY;  Surgeon: Virgel Manifold, MD;  Location: [facility] ENDOSCOPY;  Service: Endoscopy;  Laterality: Left;  . DILATION AND CURETTAGE OF UTERUS    . ESOPHAGOGASTRODUODENOSCOPY Left 07/17/2018   Procedure: ESOPHAGOGASTRODUODENOSCOPY (EGD);  Surgeon: Virgel Manifold, MD;  Location: [facility] ENDOSCOPY;  Service: Endoscopy;  Laterality: Left;  . Laminectomy Posterior Cervicle Decomp W/Facetectomy & Foraminotomy     [id number] x 3...Marland KitchenMarland KitchenEXCISION CENTRAL LEFT [place] HERNIATION L 2-3; Surgeon: Loni Dolly, MD; Location: [place]; Service: Orthopedics; Laterality: Left  . Laminectomy Posterior Lumbar Facetectomy &  Foraminotomy W/Decomp     BILATERAL LUMBAR DECOMPRESSION L2-3, L3-4, L4-5; Surgeon: Loni Dolly, MD; Location: [place]; Service: Orthopedics; Laterality: Bilateral;  . PARTIAL MASTECTOMY WITH NEEDLE LOCALIZATION Left 11/29/2014   Procedure: PARTIAL MASTECTOMY WITH NEEDLE LOCALIZATION;  Surgeon: Leonie Green, MD;  Location: [facility] ORS;  Service: General;  Laterality: Left;  . SENTINEL NODE BIOPSY Left 11/29/2014   Procedure: SENTINEL NODE BIOPSY;  Surgeon: Leonie Green, MD;  Location: [facility] ORS;  Service: General;  Laterality: Left;    There were no vitals filed for this visit.   Subjective Assessment - 07/11/20 1615    Subjective "My voice sounded a little gravelly this morning."    Currently in Pain? No/denies                 ADULT SLP TREATMENT - 07/11/20 0001      General Information   Behavior/Cognition Alert;Cooperative    HPI Kathy Bean is a 70 y.o. female referred by Dr. 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 Per chart review pt with hypophonia, dysphagia      Treatment Provided   Treatment provided Cognitive-Linquistic      Pain Assessment   Pain Assessment No/denies pain      Cognitive-Linquistic Treatment   Treatment focused on Dysarthria;Patient/family/caregiver education    Skilled Treatment LSVT Daily tasks: Loud /a/ task averaged 84 dB, with a duration  average of 7.4 seconds, with occasional models and cues for vocal quality. Patient completed high and low pitch tasks. Patient's max F0 for high pitch glides measured 329 Hz (86 dB avg). Patient's min F0 for low glides was 182 Hz (84 dB avg); Patient completed functional phrases task, averaging 88 dB. Mulit-paragraph level reading task averaged 85 dB. Carryover to conversation for periods of 5-8 minutes between tasks and spontaneous responses to questions, averaged 83 dB with minimial cues for loudness.      Assessment / Recommendations / Plan   Plan Continue with current plan of  care      Progression Toward Goals   Progression toward goals Progressing toward goals            SLP Education - 07/11/20 1632    Education Details Education on wider mouth opening for better vocal quality    Person(s) Educated Patient    Methods Demonstration;Explanation    Comprehension Verbalized understanding;Returned demonstration            SLP Short Term Goals - 07/09/20 1530      SLP SHORT TERM GOAL #1   Title The patient will complete Daily Tasks (Maximum duration "ah", High/Lows, and Functional Phrases) at average loudness >/= 85 dB and with loud, good quality voice with min cues.    Time 10   sessions   Status Achieved      SLP SHORT TERM GOAL #2   Title The patient will complete Hierarchal Speech Loudness reading drills (words/phrases, sentences) at average >/= 75 dB and with loud, good quality voice with min cues.    Time 10   sessions   Period Weeks    Status Achieved      SLP SHORT TERM GOAL #3   Title The patient will participate in 5-8 minutes conversation, maintaining average loudness of 75 dB and loud, good quality voice with min cues.    Time 10   sessions   Status Achieved            SLP Long Term Goals - 07/09/20 1531      SLP LONG TERM GOAL #1   Title The patient will complete Daily Tasks (Maximum duration "ah", High/Lows, and Functional Phrases) at average loudness >/= 85 dB and with loud, good quality voice.    Time 4   or 17 sessions, for all LTGs   Period Weeks    Status On-going      SLP LONG TERM GOAL #2   Title The patient will complete Hierarchal Speech Loudness reading drills (words/phrases, sentences, and paragraph) at average >/= 75 dB and with loud, good quality voice.    Time 4    Period Weeks    Status On-going      SLP LONG TERM GOAL #3   Title The patient will participate in 15-20 minutes conversation, maintaining average loudness of >/= 75 dB and loud, good quality voice.    Time 4    Period Weeks    Status On-going       SLP LONG TERM GOAL #4   Title Patient will report improved communication effectiveness as measured by Communicative Effectiveness Survey.    Baseline 21/32 on 06/10/2020, higher score = "More effective"    Time 4    Period Weeks    Status On-going      SLP LONG TERM GOAL #5   Title Patient will participate in Modified Barium Swallow study for assessment of swallow function if clinically indicated.  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 Patient continues to consume liquids without overt signs of aspiration; will continue to monitor. Patient benefitted from modeling and demonstration to achieve desired vocal quality. Patient's spouse reports positive changes in patient's voice since treatment. She has met 3/3 short term goals and is progressing toward LTGs. I recommend skilled ST for LSVT-LOUD treatment 4 days a week x4 weeks to improve vocal quality and ability to communicate with family and friends in the community.    Speech Therapy Frequency 4x / week    Duration 4 weeks    Treatment/Interventions Aspiration precaution training;SLP instruction and feedback;Functional tasks;Patient/family education;Cueing hierarchy;Other (comment)   LSVT-LOUD   Potential to Achieve Goals Good    SLP Home Exercise Plan LSVT LOUD homework provided    Consulted and Agree with Plan of Care Patient;Family member/caregiver           Patient will benefit from skilled therapeutic intervention in order to improve the following deficits and impairments:   Dysarthria and anarthria  Parkinson's disease [facility])    Problem List Patient Active Problem List   Diagnosis Date Noted  . Chronic anemia 08/09/2018  . External hemorrhoids   . Hypertrophy of anal papillae   . Loss of weight   . Diverticulosis of large intestine without diverticulitis    . Dehydration 07/15/2018  . Parkinson's disease ([place]) 04/18/2018  . Hyperlipidemia, mixed 10/27/2016  . Benign essential hypertension 06/11/2016  . Malignant neoplasm of central portion of left female breast ([place]) 04/16/2015  . Primary cancer of lower-inner quadrant of left female breast ([place]) 12/04/2014  . Acquired spondylolisthesis 10/11/2013  . B12 deficiency 10/02/2013  . Lumbar disc disease 09/07/2013   Kathy Bean, SLP Graduate Clinician  Kathy Bean 07/11/2020, 4:37 PM  [place] MAIN [facility] SERVICES [address] [place], [place], [address] Phone: [phone]   Fax:  [phone]   Name: Kathy Bean MRN: [id number] Date of Birth: Mar 17, 1950

## 2020-07-12 ENCOUNTER — Encounter: Payer: Self-pay | Admitting: Occupational Therapy

## 2020-07-12 NOTE — Therapy (Signed)
[place] MAIN [facility] SERVICES [address] [place], [place], [address] Phone: [phone]   Fax:  [phone]  Occupational Therapy Treatment  Patient Details  Name: Kathy Bean MRN: [id number] Date of Birth: [birth year] No data recorded  Encounter Date: 07/11/2020   OT End of Session - 07/12/20 1745    Visit Number 16    Number of Visits 17    Date for OT Re-Evaluation 07/19/20    Authorization Type Progress reporting period starting 07/01/2020    OT Start Time 1301    OT Stop Time 1400    OT Time Calculation (min) 59 min    Activity Tolerance Patient tolerated treatment well    Behavior During Therapy [facility] for tasks assessed/performed           Past Medical History:  Diagnosis Date  . Breast cancer ([place]) 11/2014   left  . Cancer [facility]) 2016   left breast  . Depression   . Dysrhythmia    heart racing  . Family history of adverse reaction to anesthesia    brother had difficulty going under anesthseia  . Headache    hx of migraines   period induced  . Hypertension   . Osteoarthritis   . Parkinson disease ([place])   . Personal history of radiation therapy     Past Surgical History:  Procedure Laterality Date  . BREAST BIOPSY Left 10/2014   positive  . BREAST LUMPECTOMY Left 2016   invasive mammary  . COLONOSCOPY Left 07/17/2018   Procedure: COLONOSCOPY;  Surgeon: Virgel Manifold, MD;  Location: [facility] ENDOSCOPY;  Service: Endoscopy;  Laterality: Left;  . DILATION AND CURETTAGE OF UTERUS    . ESOPHAGOGASTRODUODENOSCOPY Left 07/17/2018   Procedure: ESOPHAGOGASTRODUODENOSCOPY (EGD);  Surgeon: Virgel Manifold, MD;  Location: [facility] ENDOSCOPY;  Service: Endoscopy;  Laterality: Left;  . Laminectomy Posterior Cervicle Decomp W/Facetectomy & Foraminotomy     [id number] x 3...Marland KitchenMarland KitchenEXCISION CENTRAL LEFT [place] HERNIATION L 2-3; Surgeon: Loni Dolly, MD; Location: [place]; Service: Orthopedics; Laterality: Left  . Laminectomy Posterior  Lumbar Facetectomy & Foraminotomy W/Decomp     BILATERAL LUMBAR DECOMPRESSION L2-3, L3-4, L4-5; Surgeon: Loni Dolly, MD; Location: [place]; Service: Orthopedics; Laterality: Bilateral;  . PARTIAL MASTECTOMY WITH NEEDLE LOCALIZATION Left 11/29/2014   Procedure: PARTIAL MASTECTOMY WITH NEEDLE LOCALIZATION;  Surgeon: Leonie Green, MD;  Location: [facility] ORS;  Service: General;  Laterality: Left;  . SENTINEL NODE BIOPSY Left 11/29/2014   Procedure: SENTINEL NODE BIOPSY;  Surgeon: Leonie Green, MD;  Location: [facility] ORS;  Service: General;  Laterality: Left;    There were no vitals filed for this visit.   Subjective Assessment - 07/12/20 1744    Subjective  Pt reports she still has a week left of speech therapy, one more visit next week to complete intensive portion of LSVT BIG    Pertinent History Pt was diagnosed with Parkinson's since July 2015.   Pt reports decline in strength in legs, walking, feels "wobble".  Reports feeling fuzzy at times, has some tremors right side.    Patient Stated Goals Pt would like to be as independent as possible, be more stable.    Currently in Pain? No/denies    Pain Score 0-No pain           Maximal Daily Exercises performed inSTANDARDVERSION(with assist from therapist)  Patient seen for initial instruction of LSVT BIG exercises: LSVT Daily Session Maximal Daily Exercises: Sustained movements are designed to rescale  the amplitude of movement output for generalization to daily functional activities. Performed as follows for 1 set of 10 repetitions each: Multi directional sustained movements- 1) Floor to ceiling, 2) Side to side. Multi directional Repetitive movements performed in standing and are designed to provide retraining effort needed for sustained muscle activation in tasks Performed as follows: 3) Step and reach forward, 4) Step and Reach Backwards, 5) Step and reach sideways, 6) Rock and reach forward/backward, 7) Rock and reach  sideways(eliminated due to back pain)  Min guard for exercises this date except stepping backwards required min assist for balance, continued cues for increasing movements in BIG fashion, continues to work on calibration of movement patterns.   Functional component tasks: Reaching to behind the head with each arm, cues for BIG hand movements Sit to stand from mat table on lowest setting with cues for weight shift, technique andmin assistfor 10 reps for 1 set, patient able to demonstrate improved weight shift forwards Manipulationof coins from flat surface, picking up and placing into moderate resistance bank. Hand flicks performed prior to task.   7 hand exercises to help promote handwriting, dexterity and flexibility. 1) Fisting with arm then arm extension with fingers extended with BIG hand movements 2) oppositional movements of the thumb to each digit 3) wrist flex/ext with elbows extended 4) supination/pronation 5) tendon gliding with hook fist and then finger extension 6) tendon gliding with tabletop movement of fingers with MP flexion, PIP and DIP extension 7) MP flexion, PIP flexion, DIP extension Performed with therapist demo and occasional cues for form  Functional mobility with rollator for one trial of 447feet with supervision and cues.  Discussed plan for discharge next week, will perform outcome measures next session.   Response to tx: Pt has continued to make good progress, she is demonstrating improved ability to perform transitional movements from sit to stand, husband reports patient is getting out of bed better at home.  She has not had any falls during the last few weeks since starting therapy.  She continues to improve with her performance with exercises and has been able to transition to performing exercises in a standard version this week but continues to have the chair available in case she requires it for balance.  Most challenging exercise for  her at this time is stepping backwards but did not have any loss of balance with this exercise today.  Continues to work towards increasing amplitude of steps and movement.  Reassess outcome measures next session and prepare for discharge.                       OT Education - 07/12/20 1745    Education Details maximal daily exercises in standard version, functional mobility, amplitude of steps    Person(s) Educated Patient;Child(ren)    Methods Explanation;Demonstration;Verbal cues    Comprehension Verbalized understanding;Returned demonstration               OT Long Term Goals - 07/01/20 1318      OT LONG TERM GOAL #1   Title Patient will improve gait speed and endurance to be able to ambulate 600 feet in 6 minutes to negotiate around the home and community safely in 4 weeks.    Baseline 07/01/2020: 575 feet; Eval: 425 feet at eval    Time 4    Status On-going    Target Date 07/19/20      OT LONG TERM GOAL #2   Title Patient will complete  HEP for maximal daily exercises with modified independence in 4 weeks.    Baseline Pt. currently using the adaptive version of the MDEs.    Time 4    Period Weeks    Status On-going    Target Date 07/19/20      OT LONG TERM GOAL #3   Title Patient will transfer from to sit to stand without the use of arms safely and independently from a variety of chairs/surfaces in 4 weeks.    Baseline 07/01/2020: Pt. unable even with rocking for momentum. Pt. unable at evaluation    Time 4    Period Weeks    Status New    Target Date 07/19/20      OT LONG TERM GOAL #4   Title Pt will complete toilet transfers with modified independence    Baseline Pt. continues to require occasional assist from husband    Time 4    Period Weeks    Status On-going    Target Date 07/19/20      OT LONG TERM GOAL #5   Title Pt will improve ROM in UE to complete hair care with occasional min assist    Baseline mod to maxA    Time 4    Period Weeks     Status On-going    Target Date 07/19/20      OT LONG TERM GOAL #6   Title Patient will don and doff socks with modified independence    Baseline Pt. continues to have difficulty    Time 4    Period Weeks    Status On-going    Target Date 07/19/20                 Plan - 07/12/20 1745    Clinical Impression Statement Pt has continued to make good progress, she is demonstrating improved ability to perform transitional movements from sit to stand, husband reports patient is getting out of bed better at home.  She has not had any falls during the last few weeks since starting therapy.  She continues to improve with her performance with exercises and has been able to transition to performing exercises in a standard version this week but continues to have the chair available in case she requires it for balance.  Most challenging exercise for her at this time is stepping backwards but did not have any loss of balance with this exercise today.  Continues to work towards increasing amplitude of steps and movement.  Reassess outcome measures next session and prepare for discharge.    OT Occupational Profile and History Detailed Assessment- Review of Records and additional review of physical, cognitive, psychosocial history related to current functional performance    Occupational performance deficits (Please refer to evaluation for details): ADL's;IADL's;Leisure    Body Structure / Function / Physical Skills ADL;Dexterity;Flexibility;ROM;Strength;Balance;Coordination;FMC;IADL;Body mechanics;Endurance;Gait;Pain;UE functional use;GMC;Mobility    Geophysicist/field seismologist;Sequencing    Psychosocial Skills Environmental  Adaptations;Habits;Routines and Behaviors    Rehab Potential Good    Clinical Decision Making Several treatment options, min-mod task modification necessary    Comorbidities Affecting Occupational Performance: May have comorbidities impacting occupational performance     Modification or Assistance to Complete Evaluation  No modification of tasks or assist necessary to complete eval    OT Frequency 4x / week    OT Duration 4 weeks    OT Treatment/Interventions Self-care/ADL training;Cryotherapy;Therapeutic exercise;DME and/or AE instruction;Functional Mobility Training;Balance training;Neuromuscular education;Gait Training;Manual Therapy;Moist Heat;Contrast Bath;Passive range of motion;Therapeutic activities;Patient/family education  Consulted and Agree with Plan of Care Patient;Family member/caregiver    Family Member Consulted husband at beginning of the session, daughter arrived 1/2 way into the session           Patient will benefit from skilled therapeutic intervention in order to improve the following deficits and impairments:   Body Structure / Function / Physical Skills: ADL,Dexterity,Flexibility,ROM,Strength,Balance,Coordination,FMC,IADL,Body mechanics,Endurance,Gait,Pain,UE functional use,GMC,Mobility Cognitive Skills: Attention,Memory,Sequencing Psychosocial Skills: Environmental  Adaptations,Habits,Routines and Behaviors   Visit Diagnosis: Muscle weakness (generalized)  Other lack of coordination  Unsteadiness on feet  Difficulty in walking, not elsewhere classified    Problem List Patient Active Problem List   Diagnosis Date Noted  . Chronic anemia 08/09/2018  . External hemorrhoids   . Hypertrophy of anal papillae   . Loss of weight   . Diverticulosis of large intestine without diverticulitis   . Dehydration 07/15/2018  . Parkinson's disease ([place]) 04/18/2018  . Hyperlipidemia, mixed 10/27/2016  . Benign essential hypertension 06/11/2016  . Malignant neoplasm of central portion of left female breast ([place]) 04/16/2015  . Primary cancer of lower-inner quadrant of left female breast ([place]) 12/04/2014  . Acquired spondylolisthesis 10/11/2013  . B12 deficiency 10/02/2013  . Lumbar disc disease 09/07/2013   Achilles Dunk, OTR/L,  CLT  Homer Miller 07/12/2020, 5:55 PM  [place] MAIN [facility] SERVICES [address] [place], [place], [address] Phone: [phone]   Fax:  [phone]  Name: Kathy Bean MRN: [id number] Date of Birth: 04-Apr-1950

## 2020-07-15 ENCOUNTER — Other Ambulatory Visit: Payer: Self-pay

## 2020-07-15 ENCOUNTER — Ambulatory Visit: Payer: Medicare Other | Admitting: Speech Pathology

## 2020-07-15 ENCOUNTER — Ambulatory Visit: Payer: Medicare Other | Admitting: Occupational Therapy

## 2020-07-15 DIAGNOSIS — M6281 Muscle weakness (generalized): Secondary | ICD-10-CM

## 2020-07-15 DIAGNOSIS — G20 Parkinson's disease: Secondary | ICD-10-CM

## 2020-07-15 DIAGNOSIS — R471 Dysarthria and anarthria: Secondary | ICD-10-CM

## 2020-07-15 DIAGNOSIS — R2681 Unsteadiness on feet: Secondary | ICD-10-CM | POA: Diagnosis not present

## 2020-07-15 DIAGNOSIS — R262 Difficulty in walking, not elsewhere classified: Secondary | ICD-10-CM

## 2020-07-15 DIAGNOSIS — R278 Other lack of coordination: Secondary | ICD-10-CM

## 2020-07-15 NOTE — Therapy (Signed)
[place] MAIN [facility] SERVICES [address] [place], [place], [address] Phone: [phone]   Fax:  [phone]  Speech Language Pathology Treatment  Patient Details  Name: Kathy Bean MRN: [id number] Date of Birth: 05-24-1950 Referring Provider (SLP): Dr. Jennings Books   Encounter Date: 07/15/2020   End of Session - 07/15/20 1536    Visit Number 13    Number of Visits 17    Date for SLP Re-Evaluation 09/08/20    Authorization Type Medicare    Progress Note Due on Visit 10    SLP Start Time 1400    SLP Stop Time  1500    SLP Time Calculation (min) 60 min    Activity Tolerance Patient tolerated treatment well           Past Medical History:  Diagnosis Date  . Breast cancer ([place]) 11/2014   left  . Cancer [facility]) 2016   left breast  . Depression   . Dysrhythmia    heart racing  . Family history of adverse reaction to anesthesia    brother had difficulty going under anesthseia  . Headache    hx of migraines   period induced  . Hypertension   . Osteoarthritis   . Parkinson disease ([place])   . Personal history of radiation therapy     Past Surgical History:  Procedure Laterality Date  . BREAST BIOPSY Left 10/2014   positive  . BREAST LUMPECTOMY Left 2016   invasive mammary  . COLONOSCOPY Left 07/17/2018   Procedure: COLONOSCOPY;  Surgeon: Virgel Manifold, MD;  Location: [facility] ENDOSCOPY;  Service: Endoscopy;  Laterality: Left;  . DILATION AND CURETTAGE OF UTERUS    . ESOPHAGOGASTRODUODENOSCOPY Left 07/17/2018   Procedure: ESOPHAGOGASTRODUODENOSCOPY (EGD);  Surgeon: Virgel Manifold, MD;  Location[facility] ENDOSCOPY;  Service: Endoscopy;  Laterality: Left;  . Laminectomy Posterior Cervicle Decomp W/Facetectomy & Foraminotomy     [id number] x 3...Marland KitchenMarland KitchenEXCISION CENTRAL LEFT [place] HERNIATION L 2-3; Surgeon: Loni Dolly, MD; Location: [place]; Service: Orthopedics; Laterality: Left  . Laminectomy Posterior Lumbar Facetectomy &  Foraminotomy W/Decomp     BILATERAL LUMBAR DECOMPRESSION L2-3, L3-4, L4-5; Surgeon: Loni Dolly, MD; Location: [place]; Service: Orthopedics; Laterality: Bilateral;  . PARTIAL MASTECTOMY WITH NEEDLE LOCALIZATION Left 11/29/2014   Procedure: PARTIAL MASTECTOMY WITH NEEDLE LOCALIZATION;  Surgeon: Leonie Green, MD;  Location: [facility] ORS;  Service: General;  Laterality: Left;  . SENTINEL NODE BIOPSY Left 11/29/2014   Procedure: SENTINEL NODE BIOPSY;  Surgeon: Leonie Green, MD;  Location: [facility] ORS;  Service: General;  Laterality: Left;    There were no vitals filed for this visit.   Subjective Assessment - 07/15/20 1524    Subjective "I feel a little sore/tired today"              ADULT SLP TREATMENT - 07/15/20 0001      General Information   Behavior/Cognition Alert;Cooperative    HPI Kathy Bean is a 70 y.o. female referred by Dr. 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 Per chart review pt with hypophonia, dysphagia      Pain Assessment   Pain Assessment No/denies pain      Cognitive-Linquistic Treatment   Treatment focused on Dysarthria;Patient/family/caregiver education    Skilled Treatment LSVT Daily tasks: Loud /a/ task averaged 84 dB, with a duration average of 4 seconds, with occasional cues due to changes in vocal quality. Patient completed high and low pitch tasks, with occasional models for  vocal quality. Patient initially required cues for breath support. Patient's max F0 for high pitch glides measured 344 Hz (85 dB avg). Patient's min F0 for low glides was 166 Hz (84 dB avg); Patient completed functional phrases task, averaging 85 dB. Multi-paragraph level reading task averaged 82 dB, with occasional cues for breath support/pacing. Carryover to conversation for periods lasting ~10 minutes in duration between tasks and spontaneous responses to questions, averaged 83 dB with occasional cues for loudness.      Assessment / Recommendations / Plan    Plan Continue with current plan of care      Progression Toward Goals   Progression toward goals Progressing toward goals            SLP Education - 07/15/20 1536    Education Details Education on breath support/pacing for speech tasks    Person(s) Educated Patient    Methods Explanation;Demonstration    Comprehension Verbalized understanding;Returned demonstration;Need further instruction            SLP Short Term Goals - 07/09/20 1530      SLP SHORT TERM GOAL #1   Title The patient will complete Daily Tasks (Maximum duration "ah", High/Lows, and Functional Phrases) at average loudness >/= 85 dB and with loud, good quality voice with min cues.    Time 10   sessions   Status Achieved      SLP SHORT TERM GOAL #2   Title The patient will complete Hierarchal Speech Loudness reading drills (words/phrases, sentences) at average >/= 75 dB and with loud, good quality voice with min cues.    Time 10   sessions   Period Weeks    Status Achieved      SLP SHORT TERM GOAL #3   Title The patient will participate in 5-8 minutes conversation, maintaining average loudness of 75 dB and loud, good quality voice with min cues.    Time 10   sessions   Status Achieved            SLP Long Term Goals - 07/09/20 1531      SLP LONG TERM GOAL #1   Title The patient will complete Daily Tasks (Maximum duration "ah", High/Lows, and Functional Phrases) at average loudness >/= 85 dB and with loud, good quality voice.    Time 4   or 17 sessions, for all LTGs   Period Weeks    Status On-going      SLP LONG TERM GOAL #2   Title The patient will complete Hierarchal Speech Loudness reading drills (words/phrases, sentences, and paragraph) at average >/= 75 dB and with loud, good quality voice.    Time 4    Period Weeks    Status On-going      SLP LONG TERM GOAL #3   Title The patient will participate in 15-20 minutes conversation, maintaining average loudness of >/= 75 dB and loud, good quality  voice.    Time 4    Period Weeks    Status On-going      SLP LONG TERM GOAL #4   Title Patient will report improved communication effectiveness as measured by Communicative Effectiveness Survey.    Baseline 21/32 on 06/10/2020, higher score = "More effective"    Time 4    Period Weeks    Status On-going      SLP LONG TERM GOAL #5   Title Patient will participate in Modified Barium Swallow study for assessment of swallow function if clinically indicated.  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 Patient continues to consume liquids without overt signs of aspiration; will continue to monitor. Patient continues to benefit from occasional cues and models for breath supoort/pacing for some speech tasks. Patient is demonstrating carryover to conversation and is able to reconginze and self-correct during periods of vocal fade in speaking opportunities/tasks. I recommend skilled ST for LSVT-LOUD treatment 4 days a week x4 weeks to improve vocal quality and ability to communicate with family and friends in the community.    Speech Therapy Frequency 4x / week    Duration 4 weeks    Treatment/Interventions Aspiration precaution training;SLP instruction and feedback;Functional tasks;Patient/family education;Cueing hierarchy;Other (comment)   LSVT-LOUD   Potential to Achieve Goals Good    SLP Home Exercise Plan LSVT LOUD homework provided    Consulted and Agree with Plan of Care Patient;Family member/caregiver           Patient will benefit from skilled therapeutic intervention in order to improve the following deficits and impairments:   Dysarthria and anarthria  Parkinson's disease [facility])    Problem List Patient Active Problem List   Diagnosis Date Noted  . Chronic anemia 08/09/2018  . External hemorrhoids   . Hypertrophy of  anal papillae   . Loss of weight   . Diverticulosis of large intestine without diverticulitis   . Dehydration 07/15/2018  . Parkinson's disease ([place]) 04/18/2018  . Hyperlipidemia, mixed 10/27/2016  . Benign essential hypertension 06/11/2016  . 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 Primary cancer of lower-inner quadrant of left female breast ([place]) 12/04/2014  . Acquired spondylolisthesis 10/11/2013  . B12 deficiency 10/02/2013  . Lumbar disc disease 09/07/2013   Brooke Dare, SLP Graduate Clinician  Brooke Dare 07/15/2020, 3:47 PM  [place] MAIN [facility] SERVICES [address] [place], [place], [address] Phone: [phone]   Fax:  [phone]   Name: JHANIYA BRISKI MRN: [id number] Date of Birth: September 23, 1950

## 2020-07-16 ENCOUNTER — Ambulatory Visit: Payer: Medicare Other | Admitting: Speech Pathology

## 2020-07-16 DIAGNOSIS — R471 Dysarthria and anarthria: Secondary | ICD-10-CM

## 2020-07-16 DIAGNOSIS — G20 Parkinson's disease: Secondary | ICD-10-CM

## 2020-07-16 DIAGNOSIS — R2681 Unsteadiness on feet: Secondary | ICD-10-CM | POA: Diagnosis not present

## 2020-07-17 ENCOUNTER — Ambulatory Visit: Payer: Medicare Other | Admitting: Speech Pathology

## 2020-07-17 NOTE — Therapy (Signed)
[place] MAIN [facility] SERVICES [address] [place], [place], [address] Phone: [phone]   Fax:  [phone]  Speech Language Pathology Treatment and Discharge Summary  Patient Details  Name: Kathy Bean MRN: [id number] Date of Birth: [birth year] Referring Provider (SLP): Dr. Jennings Books   Encounter Date: 07/16/2020   End of Session - 07/17/20 1112    Visit Number 14    Number of Visits 17    Date for SLP Re-Evaluation 09/08/20    Authorization Type Medicare    Authorization - Visit Number 11    Progress Note Due on Visit 10    SLP Start Time 1400    SLP Stop Time  1500    SLP Time Calculation (min) 60 min    Activity Tolerance Patient tolerated treatment well           Past Medical History:  Diagnosis Date  . Breast cancer ([place]) 11/2014   left  . Cancer [facility]) 2016   left breast  . Depression   . Dysrhythmia    heart racing  . Family history of adverse reaction to anesthesia    brother had difficulty going under anesthseia  . Headache    hx of migraines   period induced  . Hypertension   . Osteoarthritis   . Parkinson disease ([place])   . Personal history of radiation therapy     Past Surgical History:  Procedure Laterality Date  . BREAST BIOPSY Left 10/2014   positive  . BREAST LUMPECTOMY Left 2016   invasive mammary  . COLONOSCOPY Left 07/17/2018   Procedure: COLONOSCOPY;  Surgeon: Virgel Manifold, MD;  Location: [facility] ENDOSCOPY;  Service: Endoscopy;  Laterality: Left;  . DILATION AND CURETTAGE OF UTERUS    . ESOPHAGOGASTRODUODENOSCOPY Left 07/17/2018   Procedure: ESOPHAGOGASTRODUODENOSCOPY (EGD);  Surgeon: Virgel Manifold, MD;  Location: [facility] ENDOSCOPY;  Service: Endoscopy;  Laterality: Left;  . Laminectomy Posterior Cervicle Decomp W/Facetectomy & Foraminotomy     [id number] x 3...Marland KitchenMarland KitchenEXCISION CENTRAL LEFT [place] HERNIATION L 2-3; Surgeon: Loni Dolly, MD; Location: [place]; Service: Orthopedics; Laterality:  Left  . Laminectomy Posterior Lumbar Facetectomy & Foraminotomy W/Decomp     BILATERAL LUMBAR DECOMPRESSION L2-3, L3-4, L4-5; Surgeon: Loni Dolly, MD; Location: [place]; Service: Orthopedics; Laterality: Bilateral;  . PARTIAL MASTECTOMY WITH NEEDLE LOCALIZATION Left 11/29/2014   Procedure: PARTIAL MASTECTOMY WITH NEEDLE LOCALIZATION;  Surgeon: Leonie Green, MD;  Location: [facility] ORS;  Service: General;  Laterality: Left;  . SENTINEL NODE BIOPSY Left 11/29/2014   Procedure: SENTINEL NODE BIOPSY;  Surgeon: Leonie Green, MD;  Location: [facility] ORS;  Service: General;  Laterality: Left;    There were no vitals filed for this visit.   Subjective Assessment - 07/17/20 1056    Subjective "I had leftovers for lunch today."                 ADULT SLP TREATMENT - 07/17/20 0001      General Information   Behavior/Cognition Alert;Cooperative    HPI Kathy Bean is a 70 y.o. female referred by Dr. 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 Per chart review pt with hypophonia, dysphagia      Pain Assessment   Pain Assessment No/denies pain      Cognitive-Linquistic Treatment   Skilled Treatment LSVT Daily tasks: Loud /a/ task averaged 84 dB, with a duration average of 4 seconds. Patient completed high and low pitch tasks, with occasional models for vocal quality and  pitch. Patient's max F0 for high pitch glides measured 339 Hz (86 dB avg). Patient's min F0 for low glides was 186 Hz (86 dB avg); Patient completed functional phrases task, averaging 85 dB. Multi-paragraph level reading task averaged 82 dB, with occasional cues for breath support/pacing. Carryover to conversation for periods lasting ~10-12 minutes in duration between tasks averaged 84 dB. Spontaneous responses averaged 83dB with occasional cues for loudness.      Assessment / Recommendations / Plan   Plan Discharge SLP treatment due to (comment)   pt request     Progression Toward Goals   Progression  toward goals Goals met, education completed, patient discharged from [place] [facility] - 07/17/20 1106    Education Details Instructed patient to avoid hard glottal attack during speech tasks    Methods Explanation    Comprehension Verbalized understanding;Need further instruction            SLP Short Term Goals - 07/17/20 1731      SLP SHORT TERM GOAL #1   Title The patient will complete Daily Tasks (Maximum duration "ah", High/Lows, and Functional Phrases) at average loudness >/= 85 dB and with loud, good quality voice with min cues.    Time 10   sessions   Status Achieved      SLP SHORT TERM GOAL #2   Title The patient will complete Hierarchal Speech Loudness reading drills (words/phrases, sentences) at average >/= 75 dB and with loud, good quality voice with min cues.    Time 10   sessions   Period Weeks    Status Achieved      SLP SHORT TERM GOAL #3   Title The patient will participate in 5-8 minutes conversation, maintaining average loudness of 75 dB and loud, good quality voice with min cues.    Time 10   sessions   Status Achieved            SLP Long Term Goals - 07/17/20 1731      SLP LONG TERM GOAL #1   Title The patient will complete Daily Tasks (Maximum duration "ah", High/Lows, and Functional Phrases) at average loudness >/= 85 dB and with loud, good quality voice.    Time 4   or 17 sessions, for all LTGs   Period Weeks    Status Achieved      SLP LONG TERM GOAL #2   Title The patient will complete Hierarchal Speech Loudness reading drills (words/phrases, sentences, and paragraph) at average >/= 75 dB and with loud, good quality voice.    Time 4    Period Weeks    Status Achieved      SLP LONG TERM GOAL #3   Title The patient will participate in 15-20 minutes conversation, maintaining average loudness of >/= 75 dB and loud, good quality voice.    Time 4    Period Weeks    Status Partially Met      SLP LONG TERM GOAL #4   Title Patient  will report improved communication effectiveness as measured by Communicative Effectiveness Survey.    Baseline 21/32 on 06/10/2020, higher score = "More effective"    Time 4    Period Weeks    Status Unable to assess   patient cancelled last appointment after today's session     SLP LONG TERM GOAL #5   Title Patient will participate in Modified Barium Swallow study for assessment of swallow function  if clinically indicated.    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 Patient continues to consume liquids without overt signs of aspiration; will continue to monitor. Patient is demonstrating frequent carryover to conversation and is able to reconginze and self-correct and use breath support to improve vocal quality. I recommend skilled ST for LSVT-LOUD treatment 4 days a week x4 weeks to improve vocal quality and ability to communicate with family and friends in the community.    Speech Therapy Frequency --   d/c   Duration --   d/c   Treatment/Interventions Aspiration precaution training;SLP instruction and feedback;Functional tasks;Patient/family education;Cueing hierarchy;Other (comment)   LSVT-LOUD   Potential to Achieve Goals Good    SLP Home Exercise Plan LSVT LOUD homework provided    Consulted and Agree with Plan of Care Patient;Family member/caregiver           Patient will benefit from skilled therapeutic intervention in order to improve the following deficits and impairments:   Dysarthria and anarthria  Parkinson's disease [facility])    Problem List Patient Active Problem List   Diagnosis Date Noted  . Chronic anemia 08/09/2018  . External hemorrhoids   . Hypertrophy of anal papillae   . Loss of weight   . Diverticulosis of large intestine without diverticulitis    . Dehydration 07/15/2018  . Parkinson's disease ([place]) 04/18/2018  . Hyperlipidemia, mixed 10/27/2016  . Benign essential hypertension 06/11/2016  . Malignant neoplasm of central portion of left female breast ([place]) 04/16/2015  . Primary cancer of lower-inner quadrant of left female breast ([place]) 12/04/2014  . Acquired spondylolisthesis 10/11/2013  . B12 deficiency 10/02/2013  . Lumbar disc disease 09/07/2013     SPEECH THERAPY DISCHARGE SUMMARY  Visits from Start of Care: 14  Current functional level related to goals / functional outcomes: Maintaining adequate loudness and vocal quality in conversations up to 12 minutes today. Met 2/5 LTGs, partially met 1/5, 1 UTA and 1 LTG deferred.    Remaining deficits: Mild hypokinetic dysarthria manifesting as rapid rate of speech and reduced breath support with hoarse vocal quality when not using loud voice.   Education / Equipment: Use loud voice all the time  Plan: Patient agrees to discharge.  Patient goals were partially met. Patient is being discharged due to the patient's request.  ?????         Brooke Dare, SLP Graduate Clinician    Aliene Altes 07/17/2020, 5:33 PM  [place] MAIN [facility] SERVICES [address] [place], [place], [address] Phone: [phone]   Fax:  [phone]   Name: TYRINA HINES MRN: [id number] Date of Birth: 01/24/1951

## 2020-07-18 ENCOUNTER — Ambulatory Visit: Payer: Medicare Other | Admitting: Speech Pathology

## 2020-07-18 ENCOUNTER — Encounter: Payer: Self-pay | Admitting: Occupational Therapy

## 2020-07-18 NOTE — Therapy (Signed)
[place] MAIN [facility] SERVICES [address] [place], [place], [address] Phone: [phone]   Fax:  [phone]  Occupational Therapy Treatment/Discharge Summary  Patient Details  Name: Kathy Bean MRN: [id number] Date of Birth: [birth year] No data recorded  Encounter Date: 07/15/2020   OT End of Session - 07/18/20 1802     Visit Number 17    Number of Visits 17    Date for OT Re-Evaluation 07/19/20    Authorization Type Progress reporting period starting 07/01/2020    OT Start Time 1300    OT Stop Time 1400    OT Time Calculation (min) 60 min    Activity Tolerance Patient tolerated treatment well    Behavior During Therapy [facility] for tasks assessed/performed             Past Medical History:  Diagnosis Date   Breast cancer ([place]) 11/2014   left   Cancer ([place]) 2016   left breast   Depression    Dysrhythmia    heart racing   Family history of adverse reaction to anesthesia    brother had difficulty going under anesthseia   Headache    hx of migraines   period induced   Hypertension    Osteoarthritis    Parkinson disease ([place])    Personal history of radiation therapy     Past Surgical History:  Procedure Laterality Date   BREAST BIOPSY Left 10/2014   positive   BREAST LUMPECTOMY Left 2016   invasive mammary   COLONOSCOPY Left 07/17/2018   Procedure: COLONOSCOPY;  Surgeon: Virgel Manifold, MD;  Location: [facility] ENDOSCOPY;  Service: Endoscopy;  Laterality: Left;   DILATION AND CURETTAGE OF UTERUS     ESOPHAGOGASTRODUODENOSCOPY Left 07/17/2018   Procedure: ESOPHAGOGASTRODUODENOSCOPY (EGD);  Surgeon: Virgel Manifold, MD;  Location: [facility] ENDOSCOPY;  Service: Endoscopy;  Laterality: Left;   Laminectomy Posterior Cervicle Decomp W/Facetectomy & Foraminotomy     [id number] x 3...Marland KitchenMarland KitchenEXCISION CENTRAL LEFT [place] HERNIATION L 2-3; Surgeon: Loni Dolly, MD; Location: [place]; Service: Orthopedics; Laterality: Left   Laminectomy  Posterior Lumbar Facetectomy & Foraminotomy W/Decomp     BILATERAL LUMBAR DECOMPRESSION L2-3, L3-4, L4-5; Surgeon: Loni Dolly, MD; Location: [place]; Service: Orthopedics; Laterality: Bilateral;   PARTIAL MASTECTOMY WITH NEEDLE LOCALIZATION Left 11/29/2014   Procedure: PARTIAL MASTECTOMY WITH NEEDLE LOCALIZATION;  Surgeon: Leonie Green, MD;  Location: [facility] ORS;  Service: General;  Laterality: Left;   SENTINEL NODE BIOPSY Left 11/29/2014   Procedure: SENTINEL NODE BIOPSY;  Surgeon: Leonie Green, MD;  Location: [facility] ORS;  Service: General;  Laterality: Left;    There were no vitals filed for this visit.   Subjective Assessment - 07/18/20 1800     Subjective  Pt reports she is aware of the recommendations for continuing exercises daily, minimum of one time a day, preferably 2 times a day if possible.    Pertinent History Pt was diagnosed with Parkinson's since July 2015.   Pt reports decline in strength in legs, walking, feels "wobble".  Reports feeling fuzzy at times, has some tremors right side.    Patient Stated Goals Pt would like to be as independent as possible, be more stable.    Currently in Pain? No/denies    Pain Score 0-No pain             Maximal Daily Exercises performed in STANDARD VERSION (with assist from therapist)   Patient seen for initial instruction of LSVT BIG  exercises: LSVT Daily Session Maximal Daily Exercises: Sustained movements are designed to rescale the amplitude of movement output for generalization to daily functional activities. Performed as follows for 1 set of 10 repetitions each: Multi directional sustained movements- 1) Floor to ceiling, 2) Side to side. Multi directional Repetitive movements performed in standing and are designed to provide retraining effort needed for sustained muscle activation in tasks Performed as follows: 3) Step and reach forward, 4) Step and Reach Backwards, 5) Step and reach sideways, 6) Rock and reach  forward/backward, 7) Rock and reach sideways (eliminated due to back pain)   Supervision for exercises this date except stepping backwards required min guard for balance, continued cues for increasing movements in BIG fashion.   Functional component tasks: Reaching to behind the head with each arm Sit to stand from mat table on lowest setting with cues for weight shift, technique supervision for 10 reps for 1 set, patient able to demonstrate improved weight shift forwards   Manipulation of coins from flat surface, picking up and placing into moderate resistance bank.  Hand flicks performed prior to task.    7  hand exercises to help promote handwriting, dexterity and flexibility. 1)  Fisting with arm then arm extension with fingers extended with BIG hand movements 2) oppositional movements of the thumb to each digit 3) wrist flex/ext with elbows extended 4) supination/pronation 5) tendon gliding with hook fist and then finger extension 6) tendon gliding with tabletop movement of fingers with MP flexion, PIP and DIP extension 7) MP flexion, PIP flexion, DIP extension Performed with therapist demo and occasional cues for form    5 times sit to stand 19 secs 6 min walk test 590 feet.      Response to tx: Pt has made excellent progress and met most of her goals, she was just shy of 10 feet from achieving goal of 600 feet with 6 min walk test.  She is able to now perform sit to stand from all surfaces with modified independence and without the use of arms.  Able to demonstrate consistently shifting weight forwards for transitional movements.  Pt demonstrating improved functional balance with movement patterns and has progressed from performing exercises from an adapted version with assist to standard version with supervision.  Recommend she still have a chair next to her while performing exercises in case she needs it.  Recommend she continue with daily exercises minimum of one time a day,  preferably 2 times a day.  Patient prepared for discharge at this time.  She may require follow up therapy in the future as her disease progresses.                       OT Education - 07/18/20 1802     Education Details maximal daily exercises in standard version, functional mobility, amplitude of steps    Person(s) Educated Patient;Spouse    Methods Explanation;Demonstration;Verbal cues    Comprehension Verbalized understanding;Returned demonstration                 OT Long Term Goals - 07/18/20 1804       OT LONG TERM GOAL #1   Title Patient will improve gait speed and endurance to be able to ambulate 600 feet in 6 minutes to negotiate around the home and community safely in 4 weeks.    Baseline 07/01/2020: 575 feet; Eval: 425 feet at eval, at discharge 590 feet    Time 4  Status Partially Met      OT LONG TERM GOAL #2   Title Patient will complete HEP for maximal daily exercises with modified independence in 4 weeks.    Baseline Pt. currently using the adaptive version of the MDEs. at discharge completing with supervision in standard version.    Time 4    Period Weeks    Status Partially Met      OT LONG TERM GOAL #3   Title Patient will transfer from to sit to stand without the use of arms safely and independently from a variety of chairs/surfaces in 4 weeks.    Baseline 07/01/2020: Pt. unable even with rocking for momentum. Pt. unable at evaluation    Time 4    Period Weeks    Status Achieved      OT LONG TERM GOAL #4   Title Pt will complete toilet transfers with modified independence    Baseline Pt. continues to require occasional assist from husband    Time 4    Period Weeks    Status Achieved      OT LONG TERM GOAL #5   Title Pt will improve ROM in UE to complete hair care with occasional min assist    Baseline mod to maxA    Time 4    Period Weeks    Status Achieved      OT LONG TERM GOAL #6   Title Patient will don and doff socks  with modified independence    Baseline Pt. continues to have difficulty    Time 4    Period Weeks    Status Partially Met                   Plan - 07/18/20 1803     Clinical Impression Statement Pt has made excellent progress and met most of her goals, she was just shy of 10 feet from achieving goal of 600 feet with 6 min walk test.  She is able to now perform sit to stand from all surfaces with modified independence and without the use of arms.  Able to demonstrate consistently shifting weight forwards for transitional movements.  Pt demonstrating improved functional balance with movement patterns and has progressed from performing exercises from an adapted version with assist to standard version with supervision.  Recommend she still have a chair next to her while performing exercises in case she needs it.  Recommend she continue with daily exercises minimum of one time a day, preferably 2 times a day.  Patient prepared for discharge at this time.  She may require follow up therapy in the future as her disease progresses.    OT Occupational Profile and History Detailed Assessment- Review of Records and additional review of physical, cognitive, psychosocial history related to current functional performance    Occupational performance deficits (Please refer to evaluation for details): ADL's;IADL's;Leisure    Body Structure / Function / Physical Skills ADL;Dexterity;Flexibility;ROM;Strength;Balance;Coordination;FMC;IADL;Body mechanics;Endurance;Gait;Pain;UE functional use;GMC;Mobility    Geophysicist/field seismologist;Sequencing    Psychosocial Skills Environmental  Adaptations;Habits;Routines and Behaviors    Rehab Potential Good    Clinical Decision Making Several treatment options, min-mod task modification necessary    Comorbidities Affecting Occupational Performance: May have comorbidities impacting occupational performance    Modification or Assistance to Complete Evaluation  No  modification of tasks or assist necessary to complete eval    OT Frequency 4x / week    OT Duration 4 weeks    OT Treatment/Interventions Self-care/ADL training;Cryotherapy;Therapeutic exercise;DME  and/or AE instruction;Functional Mobility Training;Balance training;Neuromuscular education;Gait Training;Manual Therapy;Moist Heat;Contrast Bath;Passive range of motion;Therapeutic activities;Patient/family education    Consulted and Agree with Plan of Care Patient;Family member/caregiver    Family Member Consulted husband at beginning of the session, daughter arrived 1/2 way into the session             Patient will benefit from skilled therapeutic intervention in order to improve the following deficits and impairments:   Body Structure / Function / Physical Skills: ADL, Dexterity, Flexibility, ROM, Strength, Balance, Coordination, FMC, IADL, Body mechanics, Endurance, Gait, Pain, UE functional use, GMC, Mobility Cognitive Skills: Attention, Memory, Sequencing Psychosocial Skills: Environmental  Adaptations, Habits, Routines and Behaviors   Visit Diagnosis: Muscle weakness (generalized)  Other lack of coordination  Parkinson's disease ([place])  Unsteadiness on feet  Difficulty in walking, not elsewhere classified    Problem List Patient Active Problem List   Diagnosis Date Noted   Chronic anemia 08/09/2018   External hemorrhoids    Hypertrophy of anal papillae    Loss of weight    Diverticulosis of large intestine without diverticulitis    Dehydration 07/15/2018   Parkinson's disease ([place]) 04/18/2018   Hyperlipidemia, mixed 10/27/2016   Benign essential hypertension 06/11/2016   Malignant neoplasm of central portion of left female breast ([place]) 04/16/2015   Primary cancer of lower-inner quadrant of left female breast ([place]) 12/04/2014   Acquired spondylolisthesis 10/11/2013   B12 deficiency 10/02/2013   Lumbar disc disease 09/07/2013   Gottfried Standish T Pilar Corrales, OTR/L,  CLT  Elliyah Liszewski 07/18/2020, 6:32 PM  [place] [facility] [address] [place], [place], [address] Phone: [phone]   Fax:  [phone]  Name: Kathy Bean MRN: [id number] Date of Birth: October 03, 1950

## 2020-07-22 ENCOUNTER — Encounter: Payer: Medicare Other | Admitting: Speech Pathology

## 2020-10-16 IMAGING — US ULTRASOUND ABDOMEN LIMITED
1 series · 14 of 25 positions shown · non-contrast
Comparison: CT abdomen and pelvis June 25, 2018

CLINICAL DATA: Right upper quadrant pain

EXAM:
ULTRASOUND ABDOMEN LIMITED RIGHT UPPER QUADRANT

[Series 1: ultrasound abdomen limited · 14 of 42 slices shown]
[im 1/42]
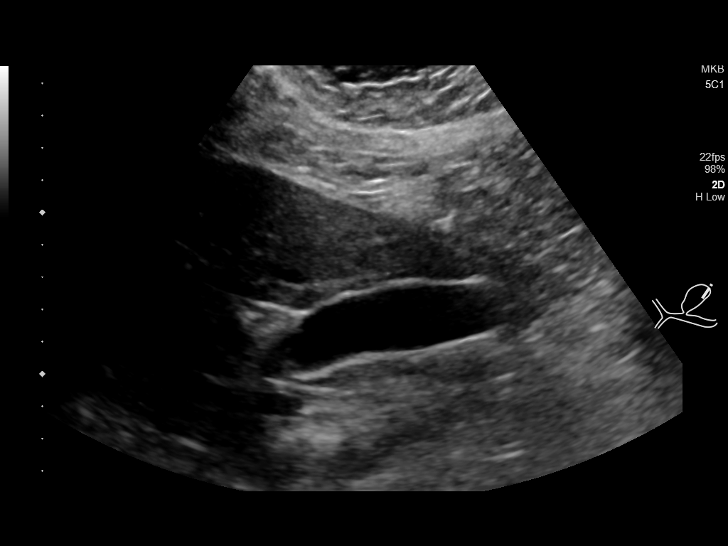
[im 4/42]
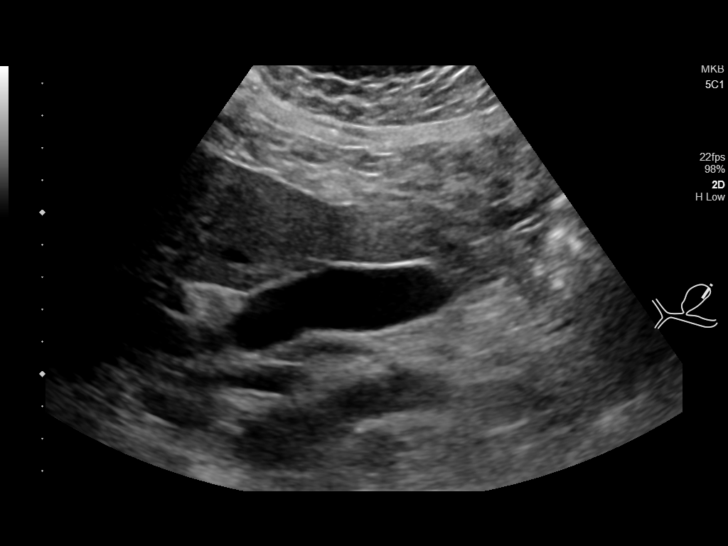
[im 7/42]
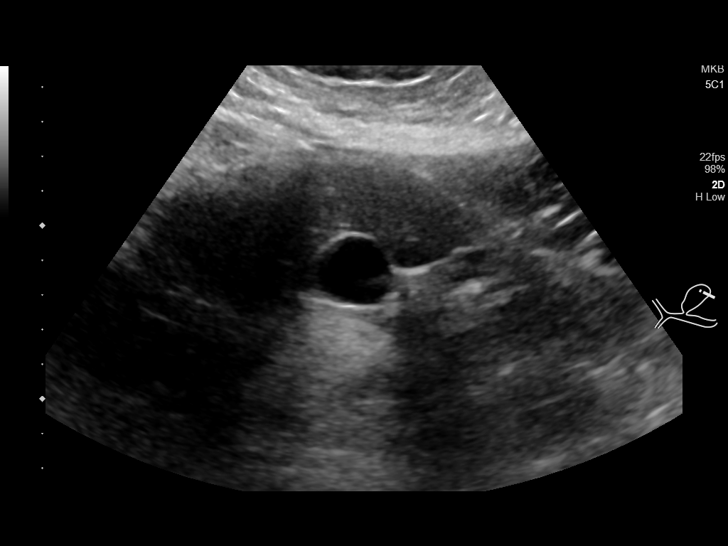
[im 11/42]
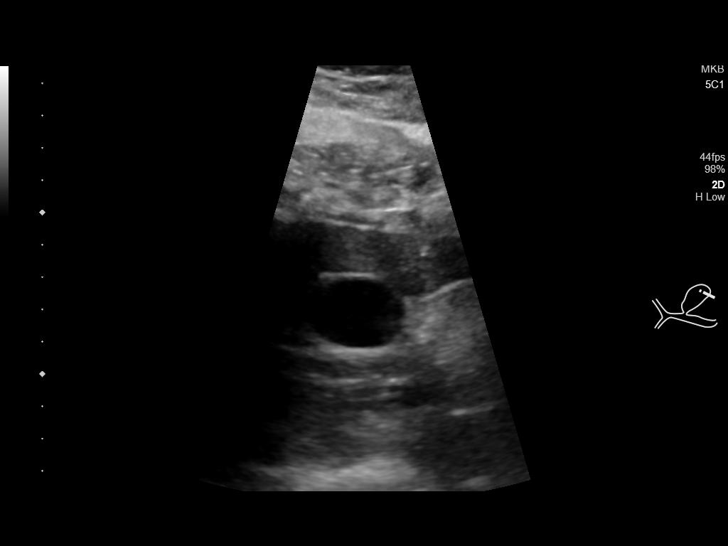
[im 14/42]
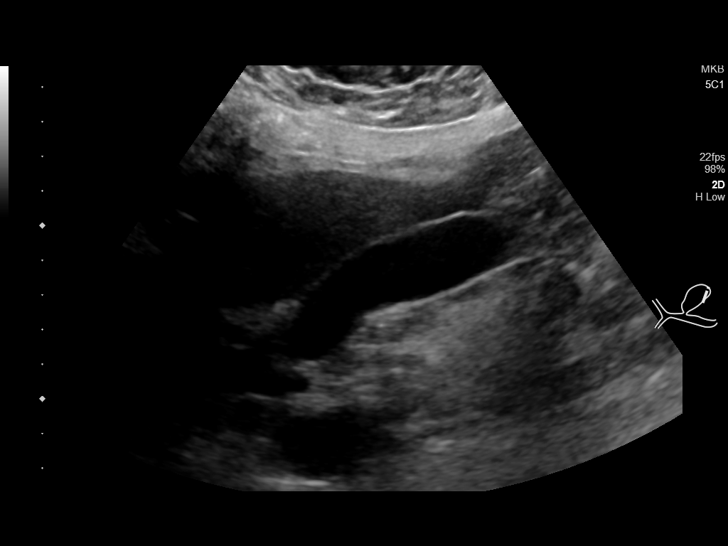
[im 16/42]
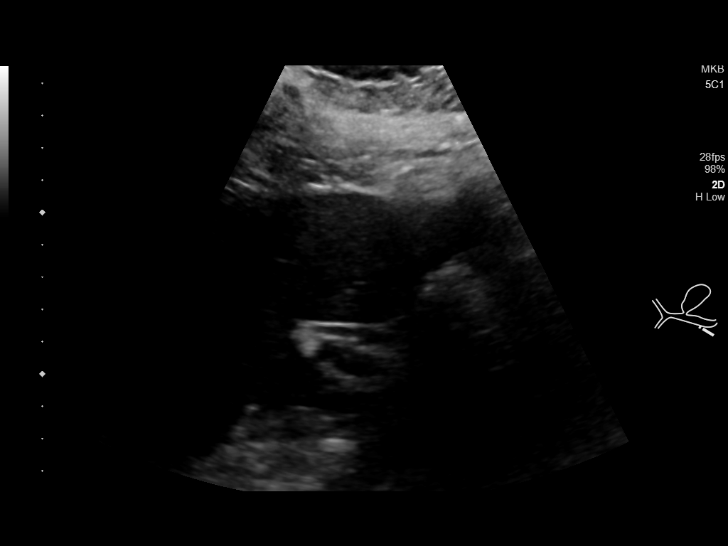
[im 19/42]
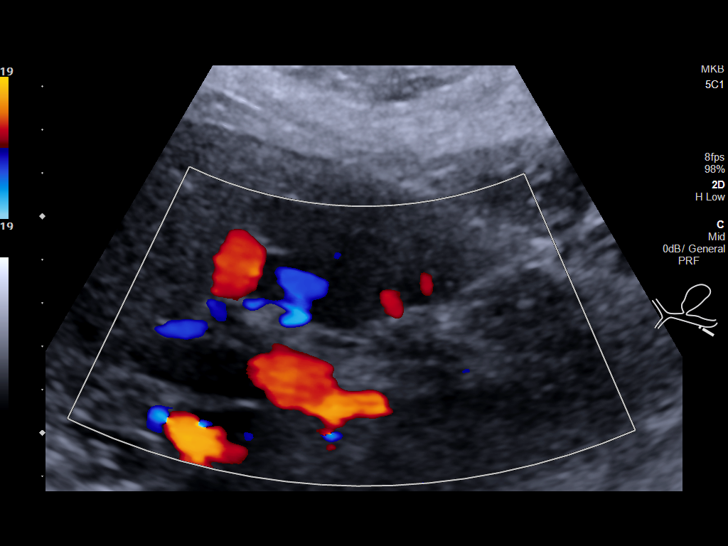
[im 23/42]
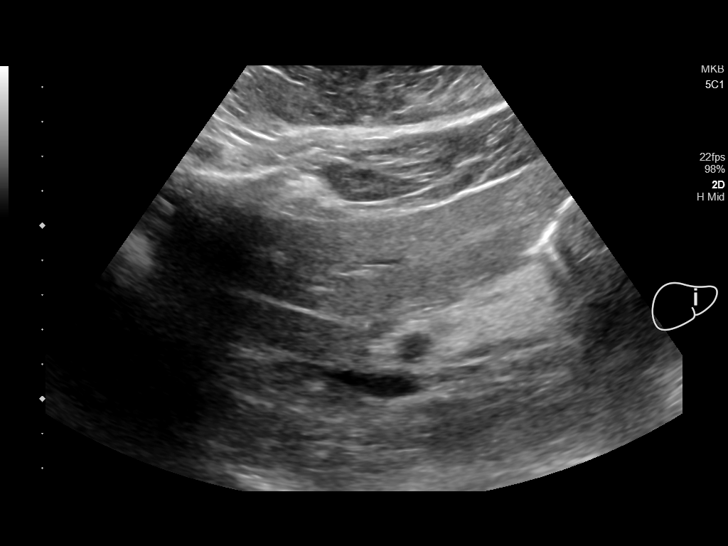
[im 26/42]
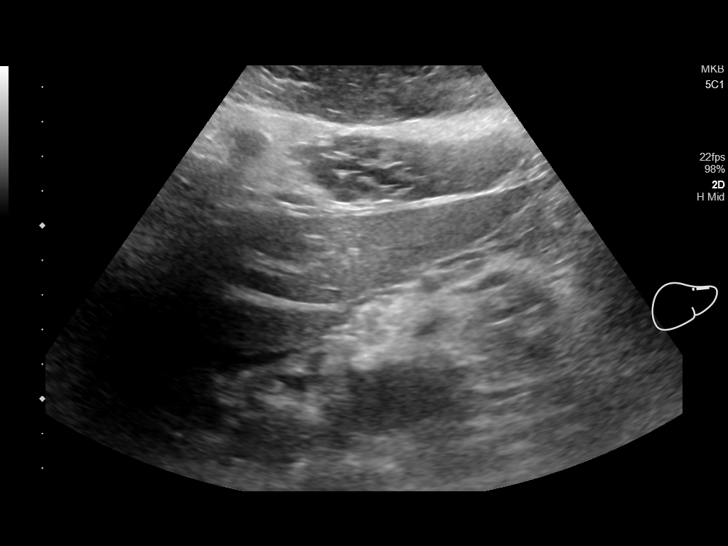
[im 28/42]
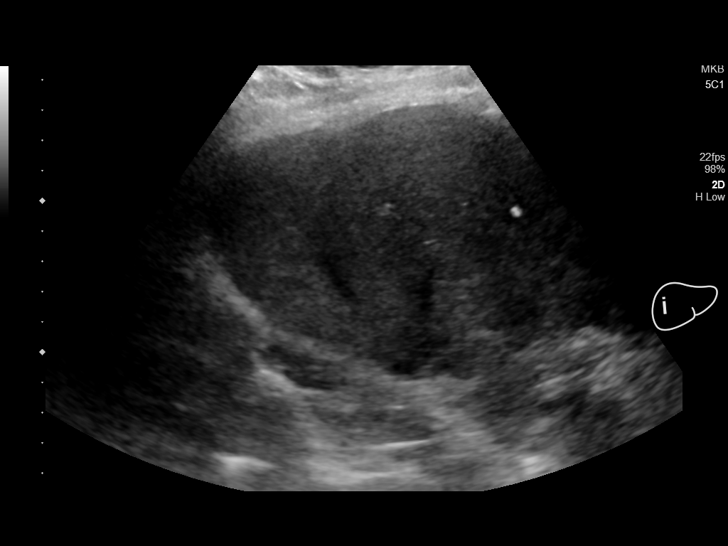
[im 31/42]
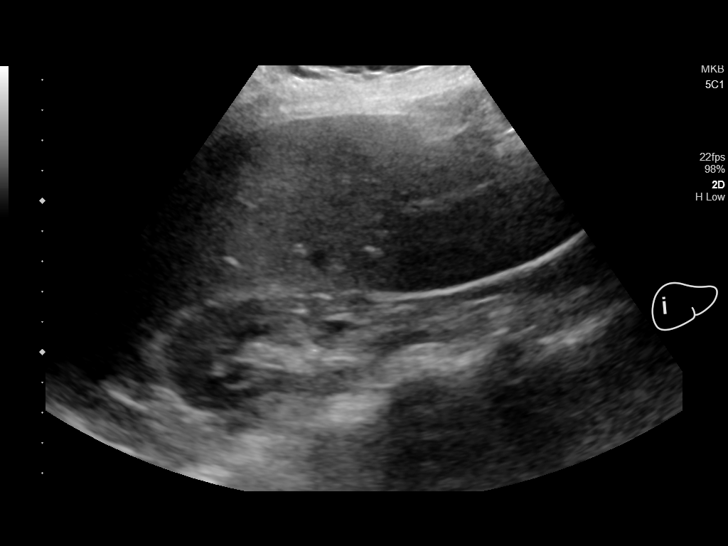
[im 35/42]
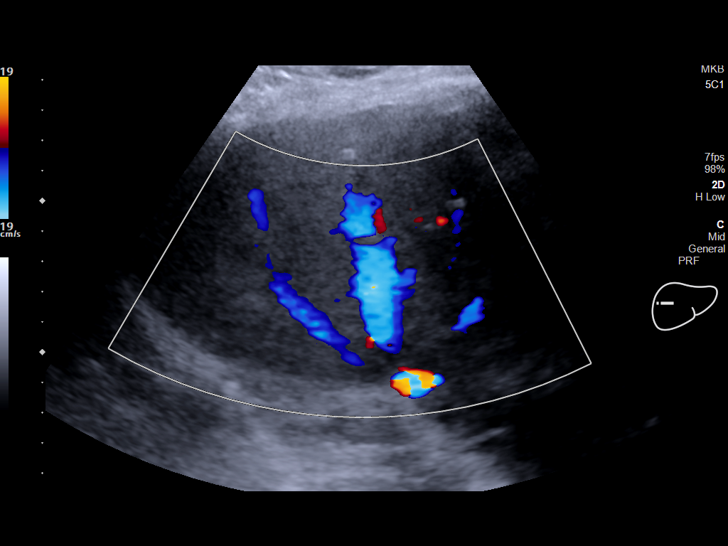
[im 38/42]
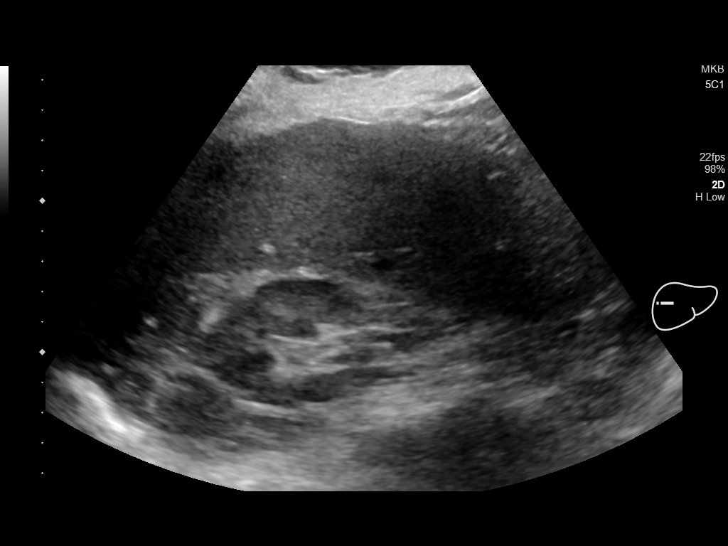
[im 42/42]
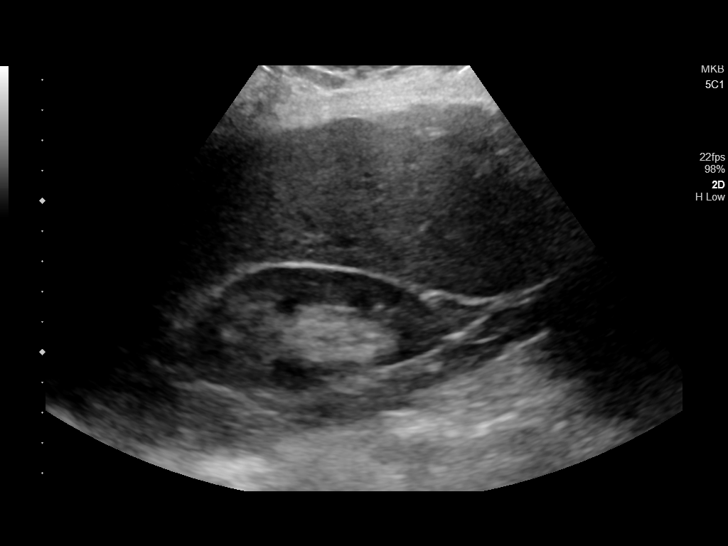

[14 of 25 positions shown; findings below may reference images not displayed]

FINDINGS: Gallbladder:

No gallstones or wall thickening visualized. There is no
pericholecystic fluid. No sonographic Murphy sign noted by
sonographer.

Common bile duct:

Diameter: 4 mm. No intrahepatic or extrahepatic biliary duct
dilatation.

Liver:

No focal lesion identified beyond scattered calcified granulomas in
the right lobe. Within normal limits in parenchymal echogenicity.
Portal vein is patent on color Doppler imaging with normal direction
of blood flow towards the liver.
IMPRESSION: Calcified granulomas in right lobe of liver. No focal liver lesions
elsewhere. Study otherwise unremarkable.

## 2020-12-17 ENCOUNTER — Other Ambulatory Visit: Payer: Self-pay | Admitting: Internal Medicine

## 2020-12-17 DIAGNOSIS — Z1231 Encounter for screening mammogram for malignant neoplasm of breast: Secondary | ICD-10-CM

## 2021-04-02 ENCOUNTER — Ambulatory Visit
Admission: RE | Admit: 2021-04-02 | Discharge: 2021-04-02 | Disposition: A | Discharge status: Home / Self Care | Payer: Medicare Other | Source: Ambulatory Visit | Attending: Internal Medicine | Admitting: Internal Medicine

## 2021-04-02 ENCOUNTER — Other Ambulatory Visit: Payer: Self-pay

## 2021-04-02 DIAGNOSIS — Z1231 Encounter for screening mammogram for malignant neoplasm of breast: Secondary | ICD-10-CM | POA: Insufficient documentation

## 2021-10-17 ENCOUNTER — Other Ambulatory Visit: Payer: Self-pay | Admitting: Otolaryngology

## 2021-10-17 DIAGNOSIS — E039 Hypothyroidism, unspecified: Secondary | ICD-10-CM

## 2021-10-17 DIAGNOSIS — R0989 Other specified symptoms and signs involving the circulatory and respiratory systems: Secondary | ICD-10-CM

## 2021-10-21 ENCOUNTER — Ambulatory Visit
Admission: RE | Admit: 2021-10-21 | Discharge: 2021-10-21 | Disposition: A | Discharge status: Home / Self Care | Payer: Medicare Other | Source: Ambulatory Visit | Attending: Otolaryngology | Admitting: Otolaryngology

## 2021-10-21 DIAGNOSIS — R0989 Other specified symptoms and signs involving the circulatory and respiratory systems: Secondary | ICD-10-CM

## 2021-10-21 DIAGNOSIS — E039 Hypothyroidism, unspecified: Secondary | ICD-10-CM

## 2021-10-27 ENCOUNTER — Other Ambulatory Visit: Payer: Self-pay | Admitting: Otolaryngology

## 2021-10-27 DIAGNOSIS — R1310 Dysphagia, unspecified: Secondary | ICD-10-CM

## 2021-10-27 DIAGNOSIS — R0989 Other specified symptoms and signs involving the circulatory and respiratory systems: Secondary | ICD-10-CM

## 2021-10-27 DIAGNOSIS — R633 Feeding difficulties, unspecified: Secondary | ICD-10-CM

## 2021-11-20 ENCOUNTER — Encounter: Payer: Self-pay | Admitting: Ophthalmology

## 2021-11-24 ENCOUNTER — Ambulatory Visit
Admission: RE | Admit: 2021-11-24 | Discharge: 2021-11-24 | Disposition: A | Discharge status: Home / Self Care | Payer: Medicare Other | Source: Ambulatory Visit | Attending: Otolaryngology | Admitting: Otolaryngology

## 2021-11-24 DIAGNOSIS — R09A2 Foreign body sensation, throat: Secondary | ICD-10-CM | POA: Insufficient documentation

## 2021-11-24 DIAGNOSIS — R1310 Dysphagia, unspecified: Secondary | ICD-10-CM | POA: Insufficient documentation

## 2021-11-24 DIAGNOSIS — R633 Feeding difficulties, unspecified: Secondary | ICD-10-CM | POA: Diagnosis present

## 2021-11-24 NOTE — Therapy (Signed)
[facility] [place], [place], [address] Phone: [phone]   Fax:     Modified Barium Swallow  Patient Details  Name: Kathy Bean MRN: [id number] Date of Birth: 07/25/1950 No data recorded  Encounter Date: 11/24/2021   End of Session - 11/24/21 1733     Visit Number 1    Number of Visits 1    Date for SLP Re-Evaluation 11/24/21    SLP Start Time 68    SLP Stop Time  1330    SLP Time Calculation (min) 30 min    Activity Tolerance Patient tolerated treatment well             Past Medical History:  Diagnosis Date   Breast cancer ([place]) 11/2014   left   Cancer ([place]) 2016   left breast   Depression    Dysrhythmia    heart racing   Family history of adverse reaction to anesthesia    brother had difficulty going under anesthseia   Headache    hx of migraines   period induced   Hypertension    Hypothyroidism    Memory changes    Osteoarthritis    Parkinson disease    Personal history of radiation therapy     Past Surgical History:  Procedure Laterality Date   BREAST BIOPSY Left 10/2014   positive   BREAST LUMPECTOMY Left 2016   invasive mammary   COLONOSCOPY Left 07/17/2018   Procedure: COLONOSCOPY;  Surgeon: Virgel Manifold, MD;  Location: [facility] ENDOSCOPY;  Service: Endoscopy;  Laterality: Left;   DILATION AND CURETTAGE OF UTERUS     ESOPHAGOGASTRODUODENOSCOPY Left 07/17/2018   Procedure: ESOPHAGOGASTRODUODENOSCOPY (EGD);  Surgeon: Virgel Manifold, MD;  Location: [facility] ENDOSCOPY;  Service: Endoscopy;  Laterality: Left;   Laminectomy Posterior Cervicle Decomp W/Facetectomy & Foraminotomy     [id number] x 3...Marland KitchenMarland KitchenEXCISION CENTRAL LEFT [place] HERNIATION L 2-3; Surgeon: Loni Dolly, MD; Location: [place]; Service: Orthopedics; Laterality: Left   Laminectomy Posterior Lumbar Facetectomy & Foraminotomy W/Decomp     BILATERAL LUMBAR DECOMPRESSION L2-3, L3-4, L4-5; Surgeon: Loni Dolly, MD; Location: [place]; Service: Orthopedics; Laterality: Bilateral;   PARTIAL MASTECTOMY WITH NEEDLE LOCALIZATION Left 11/29/2014   Procedure: PARTIAL MASTECTOMY WITH NEEDLE LOCALIZATION;  Surgeon: Leonie Green, MD;  Location: [facility] ORS;  Service: General;  Laterality: Left;   SENTINEL NODE BIOPSY Left 11/29/2014   Procedure: SENTINEL NODE BIOPSY;  Surgeon: Leonie Green, MD;  Location: [facility] ORS;  Service: General;  Laterality: Left;    There were no vitals filed for this visit.      11/24/21 1700  SLP Visit Information  SLP Received On 11/24/21  Subjective  Subjective reports globus sensation, occasional coughing with liquids. Pt gestures to sternum when describing localization of sensation  Patient/Family Stated Goal figure out if something is sticking  Pain Assessment  Pain Assessment No/denies pain  General Information  Date of Onset 11/16/21 (referral date; pt reports problem ongoing for some time)  HPI Kathy Bean is a 71 y.o. female with past medical history including idiopathic Parkinson's disease, depression, and HTN, known to SLP from previous course of therapy in 2022 for LSVT-LOUD for Parkinson's disease. She is referred today by Dr. 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 Also noted "several large osteophytes impinging on pharynx. Suspect combination of reflux, osteophytes, and Parkinson's causing symptoms."  Type of Study MBS-Modified Barium Swallow Study  Previous  Swallow Assessment normal EGD in 2020; follows with GI (Dr. Alice Reichert)  Diet Prior to this Study Regular;Thin liquids  Temperature Spikes Noted No  Respiratory Status Room air  History of Recent Intubation No  Behavior/Cognition Alert;Cooperative;Pleasant mood  Oral Cavity Assessment WFL  Oral Care Completed by SLP No  Oral Cavity - Dentition Adequate natural dentition  Vision Functional for self feeding  Self-Feeding  Abilities Able to feed self  Patient Positioning Upright in chair  Baseline Vocal Quality Normal  Volitional Cough Strong  Volitional Swallow Able to elicit  Anatomy [facility]  Pharyngeal Secretions Not observed secondary MBS  Oral Motor/Sensory Function  Overall Oral Motor/Sensory Function WFL  Oral Preparation/Oral Phase  Oral Phase WFL  Pharyngeal Phase  Pharyngeal Phase WFL  Pharyngeal - Pudding  Pharyngeal- Pudding Teaspoon WFL  Pharyngeal Material does not enter airway  Pharyngeal - Nectar  Pharyngeal- Nectar Teaspoon WFL  Pharyngeal Material does not enter airway  Pharyngeal- Nectar Cup [facility]  Pharyngeal Material does not enter airway  Pharyngeal - Thin  Pharyngeal- Thin Teaspoon WFL  Pharyngeal Material does not enter airway  Pharyngeal- Thin Cup [facility]  Pharyngeal Material does not enter airway  Pharyngeal - Solids  Pharyngeal- Regular WFL  Pharyngeal Material does not enter airway  Cervical Esophageal Phase  Cervical Esophageal Phase [facility]  Clinical Impression  Clinical Impression Patient presents with oropharyngeal swallow appearing grossly within functional limits. Oral stage is characterized by adequate lip closure, bolus preparation, rotary mastication and anterior to posterior transit. There is a minimal amount of premature spillage of thin liquid to the valleculae with larger sips. Swallow initiation is timely at the level of the base of tongue (occasionally valleculae with thin). Pharyngeal stage is noted for adequate tongue base retraction, hyolaryngeal excursion, and pharyngeal constriction. Epiglottic deflection is complete; there is no penetration or aspiration. Pharyngeal stripping wave is complete with no abnormal pharyngeal residue. Amplitude/duration of cricopharyngeus opening is WFL. There was complete clearance through the cervical esophagus.  Consistencies tested were thin liquids x2 tsps, 1 cup sip, 3 sequential cup sips, nectar x1 tsp, 1 cup sip, 3 sequential cup  sips, pudding x1 tsp, mechanical soft (1/2 graham cracker with pudding). Provided education and reflux handout, reviewed study images and discussed findings with pt and spouse. Recommend patient continue regular diet with thin liquids; no further ST indicated at this time.  SLP Visit Diagnosis Dysphagia, unspecified (R13.10)  Impact on safety and function Mild aspiration risk (due to reflux findings per ENT, Parkinson's disease)  Swallow Evaluation Recommendations  SLP Diet Recommendations Regular solids;Thin liquid  Liquid Administration via Cup;Straw  Medication Administration Whole meds with liquid  Supervision Patient able to self feed  Compensations Slow rate;Small sips/bites  Postural Changes Remain semi-upright after after feeds/meals (Comment);Seated upright at 90 degrees  Treatment Plan  Oral Care Recommendations Oral care BID  Treatment Recommendations No treatment recommended at this time  Follow Up Recommendations No SLP follow up (at this time; consult if changes in swallow, cognitive-linguistic or communication function)  Assistance recommended at discharge None  Individuals Consulted  Consulted and Agree with Results and Recommendations Patient;Family member/caregiver  Family Member Consulted spouse  Report Sent to  Referring physician  Progression Toward Goals  Progression toward goals Goals met, education completed, patient discharged from SLP  SLP Evaluations  $ SLP Speech Visit 1 Visit  SLP Evaluations  $Outpatient MBS Swallow 1 Procedure    Dysphagia, unspecified type - Plan: DG SWALLOW FUNC OP MEDICARE SPEECH PATH, [place]  OP MEDICARE SPEECH PATH  Globus sensation - Plan: DG SWALLOW FUNC OP MEDICARE SPEECH PATH, DG SWALLOW FUNC OP MEDICARE SPEECH PATH  Feeding difficulties - Plan: DG SWALLOW FUNC OP MEDICARE SPEECH PATH, DG SWALLOW FUNC OP MEDICARE SPEECH PATH        Problem List Patient Active Problem List   Diagnosis Date Noted   Chronic  anemia 08/09/2018   External hemorrhoids    Hypertrophy of anal papillae    Loss of weight    Diverticulosis of large intestine without diverticulitis    Dehydration 07/15/2018   Parkinson's disease 04/18/2018   Hyperlipidemia, mixed 10/27/2016   Benign essential hypertension 06/11/2016   Malignant neoplasm of central portion of left female breast ([place]) 04/16/2015   Primary cancer of lower-inner quadrant of left female breast ([place]) 12/04/2014   Acquired spondylolisthesis 10/11/2013   B12 deficiency 10/02/2013   Lumbar disc disease 09/07/2013   Deneise Lever, MS, CCC-SLP Speech-Language Pathologist [phone]  Aliene Altes, [place] 11/24/2021, 5:45 PM  [facility] [place], [place], [address] Phone: [phone]   Fax:     Name: Kathy Bean MRN: [id number] Date of Birth: [birth year]

## 2021-11-25 NOTE — Discharge Instructions (Signed)

## 2021-11-27 ENCOUNTER — Ambulatory Visit
Admission: RE | Admit: 2021-11-27 | Discharge: 2021-11-27 | Disposition: A | Discharge status: Home / Self Care | Payer: Medicare Other | Attending: Ophthalmology | Admitting: Ophthalmology

## 2021-11-27 ENCOUNTER — Ambulatory Visit: Payer: Medicare Other | Admitting: Anesthesiology

## 2021-11-27 ENCOUNTER — Encounter: Payer: Self-pay | Admitting: Ophthalmology

## 2021-11-27 ENCOUNTER — Other Ambulatory Visit: Payer: Self-pay

## 2021-11-27 ENCOUNTER — Encounter
Admission: RE | Disposition: A | Discharge status: Home / Self Care | Payer: Self-pay | Source: Home / Self Care | Attending: Ophthalmology

## 2021-11-27 DIAGNOSIS — M1990 Unspecified osteoarthritis, unspecified site: Secondary | ICD-10-CM | POA: Diagnosis not present

## 2021-11-27 DIAGNOSIS — H2511 Age-related nuclear cataract, right eye: Secondary | ICD-10-CM | POA: Diagnosis present

## 2021-11-27 DIAGNOSIS — Z853 Personal history of malignant neoplasm of breast: Secondary | ICD-10-CM | POA: Insufficient documentation

## 2021-11-27 DIAGNOSIS — G20A1 Parkinson's disease without dyskinesia, without mention of fluctuations: Secondary | ICD-10-CM | POA: Diagnosis not present

## 2021-11-27 DIAGNOSIS — I10 Essential (primary) hypertension: Secondary | ICD-10-CM | POA: Insufficient documentation

## 2021-11-27 DIAGNOSIS — F32A Depression, unspecified: Secondary | ICD-10-CM | POA: Diagnosis not present

## 2021-11-27 HISTORY — DX: Other amnesia: R41.3

## 2021-11-27 HISTORY — PX: CATARACT EXTRACTION W/PHACO: SHX586

## 2021-11-27 HISTORY — DX: Hypothyroidism, unspecified: E03.9

## 2021-11-27 SURGERY — PHACOEMULSIFICATION, CATARACT, WITH IOL INSERTION
Anesthesia: Monitor Anesthesia Care | Site: Eye | Laterality: Right

## 2021-11-27 MED ORDER — BRIMONIDINE TARTRATE-TIMOLOL 0.2-0.5 % OP SOLN
OPHTHALMIC | Status: DC | PRN
  Administered 2021-11-27: 1 [drp] via OPHTHALMIC

## 2021-11-27 MED ORDER — SIGHTPATH DOSE#1 BSS IO SOLN
INTRAOCULAR | Status: DC | PRN
  Administered 2021-11-27: 66 mL via OPHTHALMIC

## 2021-11-27 MED ORDER — LACTATED RINGERS IV SOLN: INTRAVENOUS | Status: DC

## 2021-11-27 MED ORDER — FENTANYL CITRATE (PF) 100 MCG/2ML IJ SOLN
INTRAMUSCULAR | Status: DC | PRN
  Administered 2021-11-27: 100 ug via INTRAVENOUS

## 2021-11-27 MED ORDER — TETRACAINE HCL 0.5 % OP SOLN
1.0000 [drp] | OPHTHALMIC | Status: DC | PRN
  Administered 2021-11-27 (×3): 1 [drp] via OPHTHALMIC

## 2021-11-27 MED ORDER — MIDAZOLAM HCL 2 MG/2ML IJ SOLN
INTRAMUSCULAR | Status: DC | PRN
  Administered 2021-11-27 (×2): 1 mg via INTRAVENOUS

## 2021-11-27 MED ORDER — ARMC OPHTHALMIC DILATING DROPS
1.0000 | OPHTHALMIC | Status: DC | PRN
  Administered 2021-11-27 (×3): 1 via OPHTHALMIC

## 2021-11-27 MED ORDER — MOXIFLOXACIN HCL 0.5 % OP SOLN
OPHTHALMIC | Status: DC | PRN
  Administered 2021-11-27: 0.2 mL via OPHTHALMIC

## 2021-11-27 MED ORDER — LIDOCAINE HCL (PF) 2 % IJ SOLN
INTRAOCULAR | Status: DC | PRN
  Administered 2021-11-27: 4 mL via INTRAOCULAR

## 2021-11-27 MED ORDER — SIGHTPATH DOSE#1 NA HYALUR & NA CHOND-NA HYALUR IO KIT
PACK | INTRAOCULAR | Status: DC | PRN
  Administered 2021-11-27: 1 via OPHTHALMIC

## 2021-11-27 MED ORDER — SIGHTPATH DOSE#1 BSS IO SOLN
INTRAOCULAR | Status: DC | PRN
  Administered 2021-11-27: 15 mL via INTRAOCULAR

## 2021-11-27 SURGICAL SUPPLY — 16 items
CANNULA ANT/CHMB 27G (MISCELLANEOUS) IMPLANT
CANNULA ANT/CHMB 27GA (MISCELLANEOUS) IMPLANT
CATARACT SUITE SIGHTPATH (MISCELLANEOUS) ×1 IMPLANT
DISSECTOR HYDRO NUCLEUS 50X22 (MISCELLANEOUS) ×1 IMPLANT
DRSG TEGADERM 2-3/8X2-3/4 SM (GAUZE/BANDAGES/DRESSINGS) ×1 IMPLANT
FEE CATARACT SUITE SIGHTPATH (MISCELLANEOUS) ×1 IMPLANT
GLOVE SURG SYN 7.5  E (GLOVE) ×1
GLOVE SURG SYN 7.5 E (GLOVE) ×1 IMPLANT
GLOVE SURG SYN 7.5 PF PI (GLOVE) ×1 IMPLANT
GLOVE SURG SYN 8.5  E (GLOVE) ×1
GLOVE SURG SYN 8.5 E (GLOVE) ×1 IMPLANT
GLOVE SURG SYN 8.5 PF PI (GLOVE) ×1 IMPLANT
LENS IOL TECNIS EYHANCE 14.0 (Intraocular Lens) IMPLANT
NDL FILTER BLUNT 18X1 1/2 (NEEDLE) IMPLANT
NEEDLE FILTER BLUNT 18X1 1/2 (NEEDLE) ×1 IMPLANT
WATER STERILE IRR 250ML POUR (IV SOLUTION) ×1 IMPLANT

## 2021-11-27 NOTE — Anesthesia Postprocedure Evaluation (Signed)
Anesthesia Post Note  Patient: Jaclene Bartelt Giebel  Procedure(s) Performed: CATARACT EXTRACTION PHACO AND INTRAOCULAR LENS PLACEMENT (IOC) RIGHT 6.56 00:47.9 (Right: Eye)  Patient location during evaluation: PACU Anesthesia Type: MAC Level of consciousness: awake and alert Pain management: pain level controlled Vital Signs Assessment: post-procedure vital signs reviewed and stable Respiratory status: spontaneous breathing, nonlabored ventilation, respiratory function stable and patient connected to nasal cannula oxygen Cardiovascular status: stable and blood pressure returned to baseline Postop Assessment: no apparent nausea or vomiting Anesthetic complications: no   There were no known notable events for this encounter.   Last Vitals:  Vitals:   11/27/21 1400 11/27/21 1405  BP: 119/84 108/78  Pulse: 81 84  Resp: 12 15  Temp:    SpO2: 93% 93%    Last Pain:  Vitals:   11/27/21 1405  PainSc: 0-No pain                 Deriyah Clan

## 2021-11-27 NOTE — H&P (Signed)
[place]   Primary Care Physician:  Rusty Aus, MD Ophthalmologist: Dr. Merleen Nicely  Pre-Procedure History & Physical: HPI:  Kathy Bean is a 71 y.o. female here for cataract surgery.   Past Medical History:  Diagnosis Date   Breast cancer ([place]) 11/2014   left   Cancer ([place]) 2016   left breast   Depression    Dysrhythmia    heart racing   Family history of adverse reaction to anesthesia    brother had difficulty going under anesthseia   Headache    hx of migraines   period induced   Hypertension    Hypothyroidism    Memory changes    Osteoarthritis    Parkinson disease    Personal history of radiation therapy     Past Surgical History:  Procedure Laterality Date   BREAST BIOPSY Left 10/2014   positive   BREAST LUMPECTOMY Left 2016   invasive mammary   COLONOSCOPY Left 07/17/2018   Procedure: COLONOSCOPY;  Surgeon: Virgel Manifold, MD;  Location: [facility] ENDOSCOPY;  Service: Endoscopy;  Laterality: Left;   DILATION AND CURETTAGE OF UTERUS     ESOPHAGOGASTRODUODENOSCOPY Left 07/17/2018   Procedure: ESOPHAGOGASTRODUODENOSCOPY (EGD);  Surgeon: Virgel Manifold, MD;  Location: [facility] ENDOSCOPY;  Service: Endoscopy;  Laterality: Left;   Laminectomy Posterior Cervicle Decomp W/Facetectomy & Foraminotomy     [id number] x 3...Marland KitchenMarland KitchenEXCISION CENTRAL LEFT [place] HERNIATION L 2-3; Surgeon: Loni Dolly, MD; Location: [place]; Service: Orthopedics; Laterality: Left   Laminectomy Posterior Lumbar Facetectomy & Foraminotomy W/Decomp     BILATERAL LUMBAR DECOMPRESSION L2-3, L3-4, L4-5; Surgeon: Loni Dolly, MD; Location: [place]; Service: Orthopedics; Laterality: Bilateral;   PARTIAL MASTECTOMY WITH NEEDLE LOCALIZATION Left 11/29/2014   Procedure: PARTIAL MASTECTOMY WITH NEEDLE LOCALIZATION;  Surgeon: Leonie Green, MD;  Location: [facility] ORS;  Service: General;  Laterality: Left;   SENTINEL NODE BIOPSY Left 11/29/2014   Procedure: SENTINEL NODE BIOPSY;   Surgeon: Leonie Green, MD;  Location: [facility] ORS;  Service: General;  Laterality: Left;    Prior to Admission medications   Medication Sig Start Date End Date Taking? Authorizing Provider  acetaminophen (TYLENOL) 325 MG tablet Take 650 mg by mouth in the morning and at bedtime.   Yes [provider]  amLODipine (NORVASC) 5 MG tablet Take 2.5 mg by mouth at bedtime.   Yes [provider]  azelastine (ASTELIN) 0.1 % nasal spray Place into both nostrils 2 (two) times daily. Use in each nostril as directed   Yes [provider]  carbidopa-levodopa (SINEMET IR) 25-100 MG tablet Take 2 tablets by mouth 4 (four) times daily.   Yes [provider]  citalopram (CELEXA) 20 MG tablet Take 1 tablet by mouth 1 day or 1 dose. 08/01/18  Yes [provider]  Cyanocobalamin (RA VITAMIN B-12 TR) 1000 MCG TBCR Take 1,000 mcg by mouth daily.    Yes [provider]  DULoxetine (CYMBALTA) 20 MG capsule Take 20 mg by mouth daily.   Yes [provider]  entacapone (COMTAN) 200 MG tablet Take 200 mg by mouth 3 (three) times daily.   Yes [provider]  fexofenadine (ALLEGRA) 180 MG tablet Take 180 mg by mouth daily.   Yes [provider]  levothyroxine (SYNTHROID) 75 MCG tablet Take 75 mcg by mouth daily before breakfast.   Yes [provider]  omeprazole (PRILOSEC) 20 MG capsule Take 20 mg by mouth daily.   Yes [provider]  potassium chloride (K-DUR) 10 MEQ tablet Take 10 mEq by mouth 2 (two) times daily.    Yes [provider]  temazepam (RESTORIL) 7.5 MG capsule Take 7.5 mg by mouth at bedtime as needed for sleep.   Yes [provider]  triamcinolone (NASACORT ALLERGY 24HR) 55 MCG/ACT AERO nasal inhaler Place 2 sprays into the nose daily.   Yes [provider]  triamterene-hydrochlorothiazide (MAXZIDE-25) 37.5-25 MG tablet Take 2 tablets by mouth daily.    Yes [provider]   calcium carbonate (TUMS EX) 750 MG chewable tablet Chew 1 tablet by mouth 3 (three) times daily.    [provider]    Allergies as of 09/05/2021   (No Known Allergies)    Family History  Problem Relation Age of Onset   Breast cancer Maternal Aunt    Breast cancer Paternal Aunt    Coronary artery disease Mother    Heart attack Mother    Coronary artery disease Paternal Grandmother     Social History   Socioeconomic History   Marital status: Married    Spouse name: Not on file   Number of children: Not on file   Years of education: Not on file   Highest education level: Not on file  Occupational History   Not on file  Tobacco Use   Smoking status: Former    Types: Cigarettes    Quit date: 11/22/1999    Years since quitting: 22.0   Smokeless tobacco: Never  Vaping Use   Vaping Use: Never used  Substance and Sexual Activity   Alcohol use: Not Currently    Comment: Rare   Drug use: No   Sexual activity: Not on file  Other Topics Concern   Not on file  Social History Narrative   Not on file   Social Determinants of Health   Financial Resource Strain: Not on file  Food Insecurity: Not on file  Transportation Needs: Not on file  Physical Activity: Not on file  Stress: Not on file  Social Connections: Not on file  Intimate Partner Violence: Not on file    Review of Systems: See HPI, otherwise negative ROS  Physical Exam: Ht '5\' 7"'$  (1.702 m)   Wt 77.1 kg   BMI 26.63 kg/m  General:   Alert, cooperative in NAD Head:  Normocephalic and atraumatic. Respiratory:  Normal work of breathing. Cardiovascular:  RRR  Impression/Plan: Kathy Bean is here for cataract surgery.  Risks, benefits, limitations, and alternatives regarding cataract surgery have been reviewed with the patient.  Questions have been answered.  All parties agreeable.   Norvel Richards, MD  11/27/2021, 11:50 AM

## 2021-11-27 NOTE — Transfer of Care (Signed)
Immediate Anesthesia Transfer of Care Note  Patient: Kathy Bean  Procedure(s) Performed: CATARACT EXTRACTION PHACO AND INTRAOCULAR LENS PLACEMENT (IOC) RIGHT 6.56 00:47.9 (Right: Eye)  Patient Location: PACU  Anesthesia Type: MAC  Level of Consciousness: awake, alert  and patient cooperative  Airway and Oxygen Therapy: Patient Spontanous Breathing and Patient connected to supplemental oxygen  Post-op Assessment: Post-op Vital signs reviewed, Patient's Cardiovascular Status Stable, Respiratory Function Stable, Patent Airway and No signs of Nausea or vomiting  Post-op Vital Signs: Reviewed and stable  Complications: There were no known notable events for this encounter.

## 2021-11-27 NOTE — Anesthesia Preprocedure Evaluation (Signed)
Anesthesia Evaluation  Patient identified by MRN, date of birth, ID band Patient awake    Reviewed: Allergy & Precautions, H&P , NPO status , Patient's Chart, lab work & pertinent test results  History of Anesthesia Complications Negative for: history of anesthetic complications  Airway Mallampati: III  TM Distance: <3 FB Neck ROM: limited    Dental  (+) Chipped   Pulmonary neg shortness of breath, former smoker,           Cardiovascular Exercise Tolerance: Good hypertension, (-) angina(-) Past MI and (-) DOE + dysrhythmias      Neuro/Psych  Headaches, PSYCHIATRIC DISORDERS Depression    GI/Hepatic negative GI ROS, Neg liver ROS, neg GERD  ,  Endo/Other  negative endocrine ROS  Renal/GU negative Renal ROS  negative genitourinary   Musculoskeletal  (+) Arthritis ,   Abdominal   Peds  Hematology negative hematology ROS (+)   Anesthesia Other Findings Past Medical History: 11/2014: Breast cancer ([place])     Comment:  left 2016: Cancer ([place])     Comment:  left breast No date: Depression No date: Dysrhythmia     Comment:  heart racing No date: Family history of adverse reaction to anesthesia     Comment:  brother had difficulty going under anesthseia No date: Headache     Comment:  hx of migraines   period induced No date: Hypertension No date: Osteoarthritis No date: Parkinson disease [facility])  Past Surgical History: 10/2014: BREAST BIOPSY; Left     Comment:  positive 11/29/2014: BREAST EXCISIONAL BIOPSY; Left     Comment:  invasive mam ca + No date: DILATION AND CURETTAGE OF UTERUS No date: Laminectomy Posterior Cervicle Decomp W/Facetectomy &  Foraminotomy     Comment:  [id number] x 3...Marland KitchenMarland KitchenEXCISION CENTRAL LEFT [place] HERNIATION L               2-3; Surgeon: Loni Dolly, MD; Location: [place];               Service: Orthopedics; Laterality: Left No date: Laminectomy Posterior Lumbar Facetectomy &  Foraminotomy W/ Decomp     Comment:  BILATERAL LUMBAR DECOMPRESSION L2-3, L3-4, L4-5;               Surgeon: Loni Dolly, MD; Location: [place];               Service: Orthopedics; Laterality: Bilateral; 11/29/2014: PARTIAL MASTECTOMY WITH NEEDLE LOCALIZATION; Left     Comment:  Procedure: PARTIAL MASTECTOMY WITH NEEDLE LOCALIZATION;               Surgeon: Leonie Green, MD;  Location: [facility] ORS;                Service: General;  Laterality: Left; 11/29/2014: SENTINEL NODE BIOPSY; Left     Comment:  Procedure: SENTINEL NODE BIOPSY;  Surgeon: Leonie Green, MD;  Location: [facility] ORS;  Service: General;                Laterality: Left;  BMI    Body Mass Index:  23.70 kg/m      Reproductive/Obstetrics negative OB ROS                             Anesthesia Physical  Anesthesia Plan  ASA: III  Anesthesia Plan: MAC   Post-op Pain Management:  Induction: Intravenous  PONV Risk Score and Plan: Midazolam  Airway Management Planned: Natural Airway and Nasal Cannula  Additional Equipment:   Intra-op Plan:   Post-operative Plan:   Informed Consent: I have reviewed the patients History and Physical, chart, labs and discussed the procedure including the risks, benefits and alternatives for the proposed anesthesia with the patient or authorized representative who has indicated his/her understanding and acceptance.   Patient has DNR.  Discussed DNR with patient and Suspend DNR.   Dental Advisory Given  Plan Discussed with: Anesthesiologist and CRNA  Anesthesia Plan Comments: (Patient consented for risks of anesthesia including but not limited to:  - adverse reactions to medications - risk of intubation if required - damage to teeth, lips or other oral mucosa - sore throat or hoarseness - Damage to heart, brain, lungs or loss of life  Patient voiced understanding.)        Anesthesia Quick Evaluation

## 2021-11-27 NOTE — Op Note (Signed)
OPERATIVE NOTE  Kathy Bean [id number] 11/27/2021   PREOPERATIVE DIAGNOSIS: Nuclear sclerotic cataract right eye. H25.11   POSTOPERATIVE DIAGNOSIS: Nuclear sclerotic cataract right eye. H25.11   PROCEDURE:  Phacoemusification with posterior chamber intraocular lens placement of the right eye  Ultrasound time: Procedure(s): CATARACT EXTRACTION PHACO AND INTRAOCULAR LENS PLACEMENT (IOC) RIGHT 6.56 00:47.9 (Right)  LENS:   Implant Name Type Inv. Item Serial No. Manufacturer Lot No. LRB No. Used Action  LENS IOL TECNIS EYHANCE 14.0 - [id number] Intraocular Lens LENS IOL TECNIS EYHANCE 14.0 [id number] [employer]  Right 1 Implanted      SURGEON:  Courtney Heys. Lazarus Salines, MD   ANESTHESIA:  Topical with tetracaine drops, augmented with 1% preservative-free intracameral lidocaine.   COMPLICATIONS:  None.   DESCRIPTION OF PROCEDURE:  The patient was identified in the holding room and transported to the operating room and placed in the supine position under the operating microscope.  The right eye was identified as the operative eye, which was prepped and draped in the usual sterile ophthalmic fashion.   A 1 millimeter clear-corneal paracentesis was made superotemporally. Preservative-free 1% lidocaine mixed with 1:1,000 bisulfite-free aqueous solution of epinephrine was injected into the anterior chamber. The anterior chamber was then filled with Viscoat viscoelastic. A 2.4 millimeter keratome was used to make a clear-corneal incision inferotemporally. A curvilinear capsulorrhexis was made with a cystotome and capsulorrhexis forceps. Balanced salt solution was used to hydrodissect and hydrodelineate the nucleus. Phacoemulsification was then used to remove the lens nucleus and epinucleus. The remaining cortex was then removed using the irrigation and aspiration handpiece. Provisc was then placed into the capsular bag to distend it for lens placement. A +14.00 D DIB00 intraocular lens was then injected  into the capsular bag. The remaining viscoelastic was aspirated.   Wounds were hydrated with balanced salt solution.  The anterior chamber was inflated to a physiologic pressure with balanced salt solution.  No wound leaks were noted. Vigamox was injected intracamerally.  Timolol and Brimonidine drops were applied to the eye.  The patient was taken to the recovery room in stable condition without complications of anesthesia or surgery.  [name] 11/27/2021, 1:58 PM

## 2021-11-28 ENCOUNTER — Encounter: Payer: Self-pay | Admitting: Ophthalmology

## 2021-12-01 ENCOUNTER — Other Ambulatory Visit: Payer: Self-pay

## 2021-12-01 ENCOUNTER — Encounter: Payer: Self-pay | Admitting: Ophthalmology

## 2021-12-09 NOTE — Discharge Instructions (Signed)

## 2021-12-11 ENCOUNTER — Ambulatory Visit: Payer: Medicare Other | Admitting: Anesthesiology

## 2021-12-11 ENCOUNTER — Ambulatory Visit
Admission: RE | Admit: 2021-12-11 | Discharge: 2021-12-11 | Disposition: A | Discharge status: Home / Self Care | Payer: Medicare Other | Attending: Ophthalmology | Admitting: Ophthalmology

## 2021-12-11 ENCOUNTER — Encounter: Payer: Self-pay | Admitting: Ophthalmology

## 2021-12-11 ENCOUNTER — Other Ambulatory Visit: Payer: Self-pay

## 2021-12-11 ENCOUNTER — Encounter
Admission: RE | Disposition: A | Discharge status: Home / Self Care | Payer: Self-pay | Source: Home / Self Care | Attending: Ophthalmology

## 2021-12-11 DIAGNOSIS — H2512 Age-related nuclear cataract, left eye: Secondary | ICD-10-CM | POA: Diagnosis present

## 2021-12-11 DIAGNOSIS — I10 Essential (primary) hypertension: Secondary | ICD-10-CM | POA: Insufficient documentation

## 2021-12-11 DIAGNOSIS — E039 Hypothyroidism, unspecified: Secondary | ICD-10-CM | POA: Diagnosis not present

## 2021-12-11 DIAGNOSIS — Z87891 Personal history of nicotine dependence: Secondary | ICD-10-CM | POA: Insufficient documentation

## 2021-12-11 DIAGNOSIS — F32A Depression, unspecified: Secondary | ICD-10-CM | POA: Insufficient documentation

## 2021-12-11 DIAGNOSIS — E119 Type 2 diabetes mellitus without complications: Secondary | ICD-10-CM | POA: Insufficient documentation

## 2021-12-11 HISTORY — PX: CATARACT EXTRACTION W/PHACO: SHX586

## 2021-12-11 SURGERY — PHACOEMULSIFICATION, CATARACT, WITH IOL INSERTION
Anesthesia: Topical | Site: Eye | Laterality: Left

## 2021-12-11 MED ORDER — SIGHTPATH DOSE#1 BSS IO SOLN
INTRAOCULAR | Status: DC | PRN
  Administered 2021-12-11: 15 mL

## 2021-12-11 MED ORDER — LIDOCAINE HCL (PF) 2 % IJ SOLN
INTRAOCULAR | Status: DC | PRN
  Administered 2021-12-11: 1 mL via INTRAOCULAR

## 2021-12-11 MED ORDER — SIGHTPATH DOSE#1 NA HYALUR & NA CHOND-NA HYALUR IO KIT
PACK | INTRAOCULAR | Status: DC | PRN
  Administered 2021-12-11: 1 via OPHTHALMIC

## 2021-12-11 MED ORDER — MOXIFLOXACIN HCL 0.5 % OP SOLN
OPHTHALMIC | Status: DC | PRN
  Administered 2021-12-11: .2 mL via OPHTHALMIC

## 2021-12-11 MED ORDER — TETRACAINE HCL 0.5 % OP SOLN
1.0000 [drp] | OPHTHALMIC | Status: DC | PRN
  Administered 2021-12-11 (×3): 1 [drp] via OPHTHALMIC

## 2021-12-11 MED ORDER — BRIMONIDINE TARTRATE-TIMOLOL 0.2-0.5 % OP SOLN
OPHTHALMIC | Status: DC | PRN
  Administered 2021-12-11: 1 [drp] via OPHTHALMIC

## 2021-12-11 MED ORDER — SIGHTPATH DOSE#1 BSS IO SOLN
INTRAOCULAR | Status: DC | PRN
  Administered 2021-12-11: 76 mL via OPHTHALMIC

## 2021-12-11 MED ORDER — ARMC OPHTHALMIC DILATING DROPS
1.0000 | OPHTHALMIC | Status: DC | PRN
  Administered 2021-12-11 (×3): 1 via OPHTHALMIC

## 2021-12-11 MED ORDER — FENTANYL CITRATE (PF) 100 MCG/2ML IJ SOLN
INTRAMUSCULAR | Status: DC | PRN
  Administered 2021-12-11 (×2): 50 ug via INTRAVENOUS

## 2021-12-11 MED ORDER — MIDAZOLAM HCL 2 MG/2ML IJ SOLN
INTRAMUSCULAR | Status: DC | PRN
  Administered 2021-12-11: 2 mg via INTRAVENOUS

## 2021-12-11 SURGICAL SUPPLY — 12 items
CATARACT SUITE SIGHTPATH (MISCELLANEOUS) ×1 IMPLANT
DISSECTOR HYDRO NUCLEUS 50X22 (MISCELLANEOUS) ×1 IMPLANT
DRSG TEGADERM 2-3/8X2-3/4 SM (GAUZE/BANDAGES/DRESSINGS) ×1 IMPLANT
FEE CATARACT SUITE SIGHTPATH (MISCELLANEOUS) ×1 IMPLANT
GLOVE SURG SYN 7.5  E (GLOVE) ×1
GLOVE SURG SYN 7.5 E (GLOVE) ×1 IMPLANT
GLOVE SURG SYN 7.5 PF PI (GLOVE) ×1 IMPLANT
GLOVE SURG SYN 8.5  E (GLOVE) ×1
GLOVE SURG SYN 8.5 E (GLOVE) ×1 IMPLANT
GLOVE SURG SYN 8.5 PF PI (GLOVE) ×1 IMPLANT
LENS IOL TECNIS EYHANCE 13.0 (Intraocular Lens) IMPLANT
WATER STERILE IRR 250ML POUR (IV SOLUTION) ×1 IMPLANT

## 2021-12-11 NOTE — Transfer of Care (Signed)
Immediate Anesthesia Transfer of Care Note  Patient: Kathy Bean  Procedure(s) Performed: CATARACT EXTRACTION PHACO AND INTRAOCULAR LENS PLACEMENT (IOC) LEFT 3.47 00:29.2 (Left: Eye)  Patient Location: PACU  Anesthesia Type: No value filed.  Level of Consciousness: awake, alert  and patient cooperative  Airway and Oxygen Therapy: Patient Spontanous Breathing and Patient connected to supplemental oxygen  Post-op Assessment: Post-op Vital signs reviewed, Patient's Cardiovascular Status Stable, Respiratory Function Stable, Patent Airway and No signs of Nausea or vomiting  Post-op Vital Signs: Reviewed and stable  Complications: There were no known notable events for this encounter.

## 2021-12-11 NOTE — Op Note (Signed)
OPERATIVE NOTE  Kathy Bean [id number] 12/11/2021   PREOPERATIVE DIAGNOSIS: Nuclear sclerotic cataract left eye. H25.12   POSTOPERATIVE DIAGNOSIS: Nuclear sclerotic cataract left eye. H25.12   PROCEDURE:  Phacoemusification with posterior chamber intraocular lens placement of the left eye  Ultrasound time: Procedure(s): CATARACT EXTRACTION PHACO AND INTRAOCULAR LENS PLACEMENT (IOC) LEFT 3.47 00:29.2 (Left)  LENS:   Implant Name Type Inv. Item Serial No. Manufacturer Lot No. LRB No. Used Action  LENS IOL TECNIS EYHANCE 13.0 - [id number] Intraocular Lens LENS IOL TECNIS EYHANCE 13.0 [id number] [employer]  Left 1 Implanted      SURGEON:  Courtney Heys. Lazarus Salines, MD   ANESTHESIA:  Topical with tetracaine drops, augmented with 1% preservative-free intracameral lidocaine.   COMPLICATIONS:  None.   DESCRIPTION OF PROCEDURE:  The patient was identified in the holding room and transported to the operating room and placed in the supine position under the operating microscope.  The left eye was identified as the operative eye, which was prepped and draped in the usual sterile ophthalmic fashion.   A 1 millimeter clear-corneal paracentesis was made inferotemporally. Preservative-free 1% lidocaine mixed with 1:1,000 bisulfite-free aqueous solution of epinephrine was injected into the anterior chamber. The anterior chamber was then filled with Viscoat viscoelastic. A 2.4 millimeter keratome was used to make a clear-corneal incision superotemporally. A curvilinear capsulorrhexis was made with a cystotome and capsulorrhexis forceps. Balanced salt solution was used to hydrodissect and hydrodelineate the nucleus. Phacoemulsification was then used to remove the lens nucleus and epinucleus. The remaining cortex was then removed using the irrigation and aspiration handpiece. Provisc was then placed into the capsular bag to distend it for lens placement. A +13.00 D DIB00 intraocular lens was then injected into the  capsular bag. The remaining viscoelastic was aspirated.   Wounds were hydrated with balanced salt solution.  The anterior chamber was inflated to a physiologic pressure with balanced salt solution.  No wound leaks were noted. Vigamox was injected intracamerally.  Timolol and Brimonidine drops were applied to the eye.  The patient was taken to the recovery room in stable condition without complications of anesthesia or surgery.  [name] 12/11/2021, 1:45 PM

## 2021-12-11 NOTE — H&P (Signed)
[place]   Primary Care Physician:  Rusty Aus, MD Ophthalmologist: Dr. Merleen Nicely  Pre-Procedure History & Physical: HPI:  Kathy Bean is a 71 y.o. female here for cataract surgery.   Past Medical History:  Diagnosis Date   Breast cancer ([place]) 11/2014   left   Cancer ([place]) 2016   left breast   Depression    Dysrhythmia    heart racing   Family history of adverse reaction to anesthesia    brother had difficulty going under anesthseia   Headache    hx of migraines   period induced   Hypertension    Hypothyroidism    Memory changes    Osteoarthritis    Parkinson disease    Personal history of radiation therapy     Past Surgical History:  Procedure Laterality Date   BREAST BIOPSY Left 10/2014   positive   BREAST LUMPECTOMY Left 2016   invasive mammary   CATARACT EXTRACTION W/PHACO Right 11/27/2021   Procedure: CATARACT EXTRACTION PHACO AND INTRAOCULAR LENS PLACEMENT (IOC) RIGHT 6.56 00:47.9;  Surgeon: Norvel Richards, MD;  Location: [place];  Service: Ophthalmology;  Laterality: Right;   COLONOSCOPY Left 07/17/2018   Procedure: COLONOSCOPY;  Surgeon: Virgel Manifold, MD;  Location: [facility] ENDOSCOPY;  Service: Endoscopy;  Laterality: Left;   DILATION AND CURETTAGE OF UTERUS     ESOPHAGOGASTRODUODENOSCOPY Left 07/17/2018   Procedure: ESOPHAGOGASTRODUODENOSCOPY (EGD);  Surgeon: Virgel Manifold, MD;  Location: [facility] ENDOSCOPY;  Service: Endoscopy;  Laterality: Left;   Laminectomy Posterior Cervicle Decomp W/Facetectomy & Foraminotomy     [id number] x 3...Marland KitchenMarland KitchenEXCISION CENTRAL LEFT [place] HERNIATION L 2-3; Surgeon: Loni Dolly, MD; Location: [place]; Service: Orthopedics; Laterality: Left   Laminectomy Posterior Lumbar Facetectomy & Foraminotomy W/Decomp     BILATERAL LUMBAR DECOMPRESSION L2-3, L3-4, L4-5; Surgeon: Loni Dolly, MD; Location: [place]; Service: Orthopedics; Laterality: Bilateral;   PARTIAL MASTECTOMY WITH NEEDLE  LOCALIZATION Left 11/29/2014   Procedure: PARTIAL MASTECTOMY WITH NEEDLE LOCALIZATION;  Surgeon: Leonie Green, MD;  Location: [facility] ORS;  Service: General;  Laterality: Left;   SENTINEL NODE BIOPSY Left 11/29/2014   Procedure: SENTINEL NODE BIOPSY;  Surgeon: Leonie Green, MD;  Location: [facility] ORS;  Service: General;  Laterality: Left;    Prior to Admission medications   Medication Sig Start Date End Date Taking? Authorizing Provider  acetaminophen (TYLENOL) 325 MG tablet Take 650 mg by mouth in the morning and at bedtime.    [provider]  amLODipine (NORVASC) 5 MG tablet Take 2.5 mg by mouth at bedtime.    [provider]  azelastine (ASTELIN) 0.1 % nasal spray Place into both nostrils 2 (two) times daily. Use in each nostril as directed    [provider]  calcium carbonate (TUMS EX) 750 MG chewable tablet Chew 1 tablet by mouth 3 (three) times daily.    [provider]  carbidopa-levodopa (SINEMET IR) 25-100 MG tablet Take 2 tablets by mouth 4 (four) times daily.    [provider]  citalopram (CELEXA) 20 MG tablet Take 1 tablet by mouth 1 day or 1 dose. 08/01/18   [provider]  Cyanocobalamin (RA VITAMIN B-12 TR) 1000 MCG TBCR Take 1,000 mcg by mouth daily.     [provider]  DULoxetine (CYMBALTA) 20 MG capsule Take 20 mg by mouth daily.    [provider]  entacapone (COMTAN) 200 MG tablet Take 200 mg by mouth 3 (three) times daily.  [provider]  fexofenadine (ALLEGRA) 180 MG tablet Take 180 mg by mouth daily.    [provider]  levothyroxine (SYNTHROID) 75 MCG tablet Take 75 mcg by mouth daily before breakfast.    [provider]  omeprazole (PRILOSEC) 20 MG capsule Take 20 mg by mouth daily.    [provider]  potassium chloride (K-DUR) 10 MEQ tablet Take 10 mEq by mouth 2 (two) times daily.     [provider]  temazepam (RESTORIL) 7.5 MG capsule  Take 7.5 mg by mouth at bedtime as needed for sleep.    [provider]  triamcinolone (NASACORT ALLERGY 24HR) 55 MCG/ACT AERO nasal inhaler Place 2 sprays into the nose daily.    [provider]  triamterene-hydrochlorothiazide (MAXZIDE-25) 37.5-25 MG tablet Take 2 tablets by mouth daily.     [provider]    Allergies as of 09/05/2021   (No Known Allergies)    Family History  Problem Relation Age of Onset   Breast cancer Maternal Aunt    Breast cancer Paternal Aunt    Coronary artery disease Mother    Heart attack Mother    Coronary artery disease Paternal Grandmother     Social History   Socioeconomic History   Marital status: Married    Spouse name: Not on file   Number of children: Not on file   Years of education: Not on file   Highest education level: Not on file  Occupational History   Not on file  Tobacco Use   Smoking status: Former    Types: Cigarettes    Quit date: 11/22/1999    Years since quitting: 22.0   Smokeless tobacco: Never  Vaping Use   Vaping Use: Never used  Substance and Sexual Activity   Alcohol use: Not Currently    Comment: Rare   Drug use: No   Sexual activity: Not on file  Other Topics Concern   Not on file  Social History Narrative   Not on file   Social Determinants of Health   Financial Resource Strain: Not on file  Food Insecurity: Not on file  Transportation Needs: Not on file  Physical Activity: Not on file  Stress: Not on file  Social Connections: Not on file  Intimate Partner Violence: Not on file    Review of Systems: See HPI, otherwise negative ROS  Physical Exam: There were no vitals taken for this visit. General:   Alert, cooperative in NAD Head:  Normocephalic and atraumatic. Respiratory:  Normal work of breathing. Cardiovascular:  RRR  Impression/Plan: Kathy Bean is here for cataract surgery.  Risks, benefits, limitations, and alternatives regarding cataract surgery have  been reviewed with the patient.  Questions have been answered.  All parties agreeable.   Norvel Richards, MD  12/11/2021, 11:38 AM

## 2021-12-12 ENCOUNTER — Encounter: Payer: Self-pay | Admitting: Ophthalmology

## 2021-12-14 NOTE — Anesthesia Postprocedure Evaluation (Signed)
Anesthesia Post Note  Patient: Kathy Bean  Procedure(s) Performed: CATARACT EXTRACTION PHACO AND INTRAOCULAR LENS PLACEMENT (IOC) LEFT 3.47 00:29.2 (Left: Eye)  Patient location during evaluation: PACU Anesthesia Type: General Level of consciousness: awake and alert Pain management: pain level controlled Vital Signs Assessment: post-procedure vital signs reviewed and stable Respiratory status: spontaneous breathing, nonlabored ventilation, respiratory function stable and patient connected to nasal cannula oxygen Cardiovascular status: blood pressure returned to baseline and stable Postop Assessment: no apparent nausea or vomiting Anesthetic complications: no   There were no known notable events for this encounter.   Last Vitals:  Vitals:   12/11/21 1352 12/11/21 1355  BP: 110/85   Pulse: 60 65  Resp: (!) 9 12  Temp:    SpO2: 93% 93%    Last Pain:  Vitals:   12/11/21 1352  TempSrc:   PainSc: 0-No pain                 Molli Barrows

## 2021-12-14 NOTE — Anesthesia Preprocedure Evaluation (Signed)
Anesthesia Evaluation  Patient identified by MRN, date of birth, ID band Patient awake    Reviewed: Allergy & Precautions, H&P , NPO status , Patient's Chart, lab work & pertinent test results, reviewed documented beta blocker date and time   Airway Mallampati: II  TM Distance: >3 FB Neck ROM: full    Dental no notable dental hx. (+) Teeth Intact   Pulmonary neg pulmonary ROS, former smoker   Pulmonary exam normal breath sounds clear to auscultation       Cardiovascular Exercise Tolerance: Good hypertension, On Medications + dysrhythmias  Rhythm:regular Rate:Normal     Neuro/Psych  Headaches PSYCHIATRIC DISORDERS  Depression       GI/Hepatic negative GI ROS, Neg liver ROS,,,  Endo/Other  diabetesHypothyroidism    Renal/GU      Musculoskeletal   Abdominal   Peds  Hematology  (+) Blood dyscrasia, anemia   Anesthesia Other Findings   Reproductive/Obstetrics negative OB ROS                             Anesthesia Physical Anesthesia Plan  ASA: 3  Anesthesia Plan: MAC   Post-op Pain Management:    Induction:   PONV Risk Score and Plan:   Airway Management Planned:   Additional Equipment:   Intra-op Plan:   Post-operative Plan:   Informed Consent: I have reviewed the patients History and Physical, chart, labs and discussed the procedure including the risks, benefits and alternatives for the proposed anesthesia with the patient or authorized representative who has indicated his/her understanding and acceptance.       Plan Discussed with: CRNA  Anesthesia Plan Comments:        Anesthesia Quick Evaluation

## 2022-03-06 ENCOUNTER — Other Ambulatory Visit: Payer: Self-pay | Admitting: Internal Medicine

## 2022-03-06 DIAGNOSIS — Z1231 Encounter for screening mammogram for malignant neoplasm of breast: Secondary | ICD-10-CM

## 2022-03-22 ENCOUNTER — Ambulatory Visit
Admission: EM | Admit: 2022-03-22 | Discharge: 2022-03-22 | Disposition: A | Discharge status: Home / Self Care | Payer: Medicare Other | Attending: Emergency Medicine | Admitting: Emergency Medicine

## 2022-03-22 DIAGNOSIS — H1033 Unspecified acute conjunctivitis, bilateral: Secondary | ICD-10-CM

## 2022-03-22 MED ORDER — POLYMYXIN B-TRIMETHOPRIM 10000-0.1 UNIT/ML-% OP SOLN: 1.0000 [drp] | Freq: Four times a day (QID) | OPHTHALMIC | 0 refills | Status: AC

## 2022-03-22 NOTE — Discharge Instructions (Addendum)
Use the antibiotic eyedrops as prescribed.    Follow-up with your primary care provider if your symptoms are not improving.    Go to the emergency department if you have acute eye pain, changes in your vision, or other concerning symptoms.

## 2022-03-22 NOTE — ED Triage Notes (Signed)
Patient to Urgent Care with complaints of right sided eye redness and itchiness. Denies any drainage but has had some watering. Denies any known fevers.   Symptoms started this week, believes 2-3 days ago.

## 2022-03-22 NOTE — ED Provider Notes (Signed)
Kathy Bean    CSN: [id number] Arrival date & time: 03/22/22  [id number]      History   Chief Complaint Chief Complaint  Patient presents with   Eye Problem    HPI Kathy Bean is a 72 y.o. female.  Patient presents with right eye redness and itching x 2 to 3 days.  Her left eye is red today also. No eye drainage, eye injury, change in vision, eye pain, fever, chills, or other symptoms.  No treatments at home.  Her medical history includes Parkinson's disease, hypertension, breast cancer, diverticulosis.  The history is provided by the patient and medical records.    Past Medical History:  Diagnosis Date   Breast cancer ([place]) 11/2014   left   Cancer ([place]) 2016   left breast   Depression    Dysrhythmia    heart racing   Family history of adverse reaction to anesthesia    brother had difficulty going under anesthseia   Headache    hx of migraines   period induced   Hypertension    Hypothyroidism    Memory changes    Osteoarthritis    Parkinson disease    Personal history of radiation therapy     Patient Active Problem List   Diagnosis Date Noted   Chronic anemia 08/09/2018   External hemorrhoids    Hypertrophy of anal papillae    Loss of weight    Diverticulosis of large intestine without diverticulitis    Dehydration 07/15/2018   Parkinson's disease 04/18/2018   Hyperlipidemia, mixed 10/27/2016   Benign essential hypertension 06/11/2016   Malignant neoplasm of central portion of left female breast ([place]) 04/16/2015   Primary cancer of lower-inner quadrant of left female breast ([place]) 12/04/2014   Acquired spondylolisthesis 10/11/2013   B12 deficiency 10/02/2013   Lumbar disc disease 09/07/2013    Past Surgical History:  Procedure Laterality Date   BREAST BIOPSY Left 10/2014   positive   BREAST LUMPECTOMY Left 2016   invasive mammary   CATARACT EXTRACTION W/PHACO Right 11/27/2021   Procedure: CATARACT EXTRACTION PHACO AND INTRAOCULAR LENS  PLACEMENT (IOC) RIGHT 6.56 00:47.9;  Surgeon: Norvel Richards, MD;  Location: [place];  Service: Ophthalmology;  Laterality: Right;   CATARACT EXTRACTION W/PHACO Left 12/11/2021   Procedure: CATARACT EXTRACTION PHACO AND INTRAOCULAR LENS PLACEMENT ([place]) LEFT 3.47 00:29.2;  Surgeon: Norvel Richards, MD;  Location: [place];  Service: Ophthalmology;  Laterality: Left;   COLONOSCOPY Left 07/17/2018   Procedure: COLONOSCOPY;  Surgeon: Virgel Manifold, MD;  Location: [facility] ENDOSCOPY;  Service: Endoscopy;  Laterality: Left;   DILATION AND CURETTAGE OF UTERUS     ESOPHAGOGASTRODUODENOSCOPY Left 07/17/2018   Procedure: ESOPHAGOGASTRODUODENOSCOPY (EGD);  Surgeon: Virgel Manifold, MD;  Location: [facility] ENDOSCOPY;  Service: Endoscopy;  Laterality: Left;   Laminectomy Posterior Cervicle Decomp W/Facetectomy & Foraminotomy     [id number] x 3...Marland KitchenMarland KitchenEXCISION CENTRAL LEFT [place] HERNIATION L 2-3; Surgeon: Loni Dolly, MD; Location: [place]; Service: Orthopedics; Laterality: Left   Laminectomy Posterior Lumbar Facetectomy & Foraminotomy W/Decomp     BILATERAL LUMBAR DECOMPRESSION L2-3, L3-4, L4-5; Surgeon: Loni Dolly, MD; Location: [place]; Service: Orthopedics; Laterality: Bilateral;   PARTIAL MASTECTOMY WITH NEEDLE LOCALIZATION Left 11/29/2014   Procedure: PARTIAL MASTECTOMY WITH NEEDLE LOCALIZATION;  Surgeon: Leonie Green, MD;  Location: [facility];  Service: General;  Laterality: Left;   SENTINEL NODE BIOPSY Left 11/29/2014   Procedure: SENTINEL NODE BIOPSY;  Surgeon: Leonie Green, MD;  Location: [facility];  Service: General;  Laterality: Left;    OB History   No obstetric history on file.      Home Medications    Prior to Admission medications   Medication Sig Start Date End Date Taking? Authorizing Provider  trimethoprim-polymyxin b (POLYTRIM) ophthalmic solution Place 1 drop into both eyes 4 (four) times daily for 7 days. 03/22/22 03/29/22 Yes  Sharion Balloon, NP  acetaminophen (TYLENOL) 325 MG tablet Take 650 mg by mouth in the morning and at bedtime.    [provider]  amLODipine (NORVASC) 5 MG tablet Take 2.5 mg by mouth at bedtime.    [provider]  azelastine (ASTELIN) 0.1 % nasal spray Place into both nostrils 2 (two) times daily. Use in each nostril as directed    [provider]  calcium carbonate (TUMS EX) 750 MG chewable tablet Chew 1 tablet by mouth 3 (three) times daily.    [provider]  carbidopa-levodopa (SINEMET IR) 25-100 MG tablet Take 2 tablets by mouth 4 (four) times daily.    [provider]  citalopram (CELEXA) 20 MG tablet Take 1 tablet by mouth 1 day or 1 dose. 08/01/18   [provider]  Cyanocobalamin (RA VITAMIN B-12 TR) 1000 MCG TBCR Take 1,000 mcg by mouth daily.     [provider]  DULoxetine (CYMBALTA) 20 MG capsule Take 20 mg by mouth daily.    [provider]  entacapone (COMTAN) 200 MG tablet Take 200 mg by mouth 3 (three) times daily.    [provider]  fexofenadine (ALLEGRA) 180 MG tablet Take 180 mg by mouth daily.    [provider]  levothyroxine (SYNTHROID) 75 MCG tablet Take 75 mcg by mouth daily before breakfast.    [provider]  omeprazole (PRILOSEC) 20 MG capsule Take 20 mg by mouth daily.    [provider]  potassium chloride (K-DUR) 10 MEQ tablet Take 10 mEq by mouth 2 (two) times daily.     [provider]  temazepam (RESTORIL) 7.5 MG capsule Take 7.5 mg by mouth at bedtime as needed for sleep.    [provider]  triamcinolone (NASACORT ALLERGY 24HR) 55 MCG/ACT AERO nasal inhaler Place 2 sprays into the nose daily.    [provider]  triamterene-hydrochlorothiazide (MAXZIDE-25) 37.5-25 MG tablet Take 2 tablets by mouth daily.     [provider]    Family History Family History  Problem Relation Age of Onset   Breast cancer Maternal  Aunt    Breast cancer Paternal Aunt    Coronary artery disease Mother    Heart attack Mother    Coronary artery disease Paternal Grandmother     Social History Social History   Tobacco Use   Smoking status: Former    Types: Cigarettes    Quit date: 11/22/1999    Years since quitting: 22.3   Smokeless tobacco: Never  Vaping Use   Vaping Use: Never used  Substance Use Topics   Alcohol use: Not Currently    Comment: Rare   Drug use: No     Allergies   Patient has no known allergies.   Review of Systems Review of Systems  Constitutional:  Negative for chills and fever.  HENT:  Negative for ear pain and sore throat.   Eyes:  Positive for redness and itching. Negative for pain, discharge and visual disturbance.  Respiratory:  Negative for cough and shortness of breath.   Cardiovascular:  Negative for chest pain and palpitations.  Gastrointestinal:  Negative for diarrhea and vomiting.  Skin:  Negative for rash.  All other systems reviewed and are negative.    Physical Exam Triage Vital Signs ED Triage Vitals [03/22/22 0851]  Enc Vitals Group     BP      Pulse Rate (!) 103     Resp 18     Temp 98.6 F (37 C)     Temp src      SpO2 94 %     Weight      Height      Head Circumference      Peak Flow      Pain Score      Pain Loc      Pain Edu?      Excl. in [place]?    No data found.  Updated Vital Signs BP 130/80   Pulse (!) 103   Temp 98.6 F (37 C)   Resp 18   SpO2 94%   Visual Acuity Right Eye Distance:   Left Eye Distance:   Bilateral Distance:    Right Eye Near:   Left Eye Near:    Bilateral Near:     Physical Exam Vitals and nursing note reviewed.  Constitutional:      General: She is not in acute distress.    Appearance: Normal appearance. She is well-developed. She is not ill-appearing.  HENT:     Right Ear: Tympanic membrane normal.     Left Ear: Tympanic membrane normal.     Nose: Nose normal.     Mouth/Throat:     Mouth: Mucous  membranes are moist.     Pharynx: Oropharynx is clear.  Eyes:     General: Lids are normal. Vision grossly intact.        Right eye: No discharge.        Left eye: No discharge.     Extraocular Movements: Extraocular movements intact.     Conjunctiva/sclera:     Right eye: Right conjunctiva is injected.     Left eye: Left conjunctiva is injected.     Pupils: Pupils are equal, round, and reactive to light.  Cardiovascular:     Rate and Rhythm: Normal rate and regular rhythm.     Heart sounds: Normal heart sounds.  Pulmonary:     Effort: Pulmonary effort is normal. No respiratory distress.     Breath sounds: Normal breath sounds.  Musculoskeletal:     Cervical back: Neck supple.  Skin:    General: Skin is warm and dry.  Neurological:     Mental Status: She is alert.  Psychiatric:        Mood and Affect: Mood normal.        Behavior: Behavior normal.      UC Treatments / Results  Labs (all labs ordered are listed, but only abnormal results are displayed) Labs Reviewed - No data to display  EKG   Radiology No results found.  Procedures Procedures (including critical care time)  Medications Ordered in UC Medications - No data to display  Initial Impression / Assessment and Plan / UC Course  I have reviewed the triage vital signs and the nursing notes.  Pertinent labs & imaging results that were available during my care of the patient were reviewed by me and considered in my medical decision making (see chart for details).   Bilateral conjunctivitis.  Treating with Polytrim eyedrops.  Education provided on conjunctivitis.  Instructed patient to follow-up with her PCP if her symptoms are not improving.  ED precautions discussed.  Patient agrees to plan of care.    Final Clinical Impressions(s) / UC Diagnoses   Final diagnoses:  Acute conjunctivitis of both eyes, unspecified acute conjunctivitis type     Discharge Instructions      Use the antibiotic eyedrops  as prescribed.    Follow-up with your primary care provider if your symptoms are not improving.    Go to the emergency department if you have acute eye pain, changes in your vision, or other concerning symptoms.        ED Prescriptions     Medication Sig Dispense Auth. Provider   trimethoprim-polymyxin b (POLYTRIM) ophthalmic solution Place 1 drop into both eyes 4 (four) times daily for 7 days. 10 mL Sharion Balloon, NP      PDMP not reviewed this encounter.   Sharion Balloon, NP 03/22/22 [phone]

## 2022-03-31 DIAGNOSIS — G629 Polyneuropathy, unspecified: Secondary | ICD-10-CM | POA: Insufficient documentation

## 2022-04-22 ENCOUNTER — Ambulatory Visit
Admission: RE | Admit: 2022-04-22 | Discharge: 2022-04-22 | Disposition: A | Discharge status: Home / Self Care | Payer: Medicare Other | Source: Ambulatory Visit | Attending: Internal Medicine | Admitting: Internal Medicine

## 2022-04-22 DIAGNOSIS — Z1231 Encounter for screening mammogram for malignant neoplasm of breast: Secondary | ICD-10-CM | POA: Insufficient documentation

## 2022-04-28 ENCOUNTER — Other Ambulatory Visit: Payer: Self-pay | Admitting: Student

## 2022-04-28 DIAGNOSIS — G20A1 Parkinson's disease without dyskinesia, without mention of fluctuations: Secondary | ICD-10-CM

## 2022-05-11 ENCOUNTER — Ambulatory Visit
Admission: RE | Admit: 2022-05-11 | Discharge: 2022-05-11 | Disposition: A | Discharge status: Home / Self Care | Payer: Medicare Other | Source: Ambulatory Visit | Attending: Student | Admitting: Student

## 2022-05-11 DIAGNOSIS — G20A1 Parkinson's disease without dyskinesia, without mention of fluctuations: Secondary | ICD-10-CM | POA: Insufficient documentation

## 2023-03-24 ENCOUNTER — Other Ambulatory Visit: Payer: Self-pay | Admitting: Internal Medicine

## 2023-03-24 DIAGNOSIS — Z1231 Encounter for screening mammogram for malignant neoplasm of breast: Secondary | ICD-10-CM

## 2023-04-23 ENCOUNTER — Ambulatory Visit
Admission: RE | Admit: 2023-04-23 | Discharge: 2023-04-23 | Disposition: A | Discharge status: Home / Self Care | Payer: Medicare Other | Source: Ambulatory Visit | Attending: Internal Medicine | Admitting: Internal Medicine

## 2023-04-23 DIAGNOSIS — Z1231 Encounter for screening mammogram for malignant neoplasm of breast: Secondary | ICD-10-CM | POA: Diagnosis present

## 2023-06-25 DIAGNOSIS — F330 Major depressive disorder, recurrent, mild: Secondary | ICD-10-CM | POA: Insufficient documentation

## 2023-10-07 ENCOUNTER — Telehealth: Payer: Self-pay

## 2023-10-07 ENCOUNTER — Inpatient Hospital Stay
Admission: RE | Admit: 2023-10-07 | Discharge: 2023-10-07 | Disposition: A | Discharge status: Home / Self Care | Payer: Self-pay | Source: Ambulatory Visit | Attending: Neurosurgery | Admitting: Neurosurgery

## 2023-10-07 DIAGNOSIS — Z049 Encounter for examination and observation for unspecified reason: Secondary | ICD-10-CM

## 2023-10-07 NOTE — Progress Notes (Unsigned)
 Telephone Visit- Progress Note: Referring Physician:  Cleotilde Oneil FALCON, MD [address] [facility] [address] [facility] [place],  [place] [address]  Primary Physician:  Cleotilde Oneil FALCON, MD  This visit was performed via telephone.  Patient location: home Provider location: working from home  I spent a total of *** minutes non-face-to-face activities for this visit on the date of this encounter including review of current clinical condition and response to treatment.    Patient has given verbal consent to this telephone visit and we reviewed the limitations of a telephone visit. Patient wishes to proceed.    Chief Complaint:  ***  History of Present Illness: Kathy Bean is a 73 y.o. female has a history of HTN, parkinson's, breast CA, diverticulosis, B12 deficiency, chronic anemia, hyperlipidemia, depression, hydrocephalus, chronic ETOH use, peripheral neuropathy.   She has been seeing PMR at [facility] for her back and was referred by Dr. Cleotilde.   History of lumbar decompression L2-L5 by Dr. Derian on 10/10/13.   Her lumbar imaging from 11/27/22 was reviewed by Dr. Clois along with her clinic notes. Updated imaging was recommended and phone visit scheduled to discuss this.   She has chronic constant LBP with right *** leg pain ***. No left leg pain. She has gotten worse in last 2 months and has weakness. She has to use a WC due to loss of mobility.    No relief with lumbar injections.   Tobacco use: Does not smoke.   Bowel/Bladder Dysfunction: none***  Conservative measures:  Physical therapy: has done HHPT for balance/gait?***  Multimodal medical therapy including regular antiinflammatories: tylenol , prednisone  Injections:  09/28/2023: Bilateral L5-S1 transforaminal ESI 06/25/2023: Right knee injection (Dr. Cleotilde) 04/30/2023: Bilateral piriformis trigger point injections (helpful for right sided pain, no help for left-sided pain Dr. Cleotilde) 09/11/2019: Right  C5-6 transforaminal ESI (15% relief, Dr. Dodson) 06/14/2018: Right L4-5 and right L5-S1 transforaminal ESI (good relief) 05/23/2018: Right L4-5 and right L5-S1 transforaminal ESI (60% relief)  Past Surgery:  lumbar decompression L2-L5 by Dr. Pascal on 10/10/13.   She has ***no symptoms of cervical myelopathy.  The symptoms are causing a significant impact on the patient's life.   Exam: No exam done as this was a telephone encounter.     Imaging: Lumbar MRI 11/27/23:  FINDINGS:  Anatomical variants: Conventional spinal numbering  Alignment: Mild L4 on L5 anterolisthesis.  Conus medullaris: terminates at approximately L1.  Spinal Cord and Cauda Equina: Visualized portions of the spinal cord is  normal in morphology and signal. Normal appearance of the cauda equina.   Bone marrow signal: No suspicious lesions.  Sacroiliac joints: No significant degenerative changes of the visualized SI  joints  Regional soft tissues: Unremarkable.   T12-L1: Unremarkable.    L1-L2: Small disc bulge. Mild bilateral facet joint degenerative changes.  L2-L3: Disc bulge with height loss. Mild bilateral facet joint degenerative  changes. Mild spinal canal stenosis. Mild right lateral recess stenosis.  Moderate left lateral recess stenosis.  L3-L4: Disc bulge with height loss. Moderate bilateral facet joint  degenerative changes. Mild right lateral recess stenosis. Mild bilateral  neural foraminal stenoses.  L4-L5: Disc bulge. Moderate-to-severe bilateral facet joint degenerative  changes with facet joint effusions. Moderate spinal canal stenosis. Right  ligamentum flavum hypertrophy. Mild bilateral lateral recess stenoses.  Moderate right neural foraminal stenosis.  L5-S1: Disc bulge with superimposed central disc extrusion with minimal  caudal extension. Mild bilateral facet joint degenerative changes.  Ligamentum flavum hypertrophy. Mild bilateral  lateral recess stenoses. Mild  right neural  foraminal stenosis. Mild spinal canal stenosis.   IMPRESSION:  No significant change in multilevel lumbar degenerative changes, which are  worst at L4-L5 where there is anterolisthesis, moderate spinal canal  stenosis, moderate-to-severe bilateral facet joint degenerative changes,  and moderate right neural foraminal stenosis.    Examination independently interpreted by staff radiologist.   Electronically Signed by:  Massie Fireman, MD  Electronically Signed on:  11/27/2022 12:11 PM    I have personally reviewed the images and agree with the above interpretation. Above MRI reviewed by Dr. Clois prior to his visit.   Assessment and Plan: [name] is a pleasant 73 y.o. female with ***  Treatment options discussed with patient and following plan made:   - Order for physical therapy for *** spine ***. Patient to call to schedule appointment. *** - Continue current medications including ***. Reviewed dosing and side effects.  - Prescription for ***. Reviewed dosing and side effects. Take with food.  - Prescription for *** to take prn muscle spasms. Reviewed dosing and side effects. Discussed this can cause drowsiness.  - MRI of *** to further evaluate *** radiculopathy. No improvement time or medications (***).  - Referral to PMR at [facility] to discuss possible *** injections.  - Will schedule phone visit to review MRI results once I get them back.   Glade Boys PA-C Neurosurgery

## 2023-10-07 NOTE — Telephone Encounter (Addendum)
 This referral needs to be reviewed with Dr Clois.  Per Dr Dianne note 10/05/23: Lumbar myelopathy-progressive weakness in the right leg over the last 2 months, particularly over the last few weeks to the point that she cannot weight-bear. Has associated foot drop on that side. Consistent with L4-5 and L5-S1 disease. She is becoming wheelchair-bound, totally disabled, husband not able to look after any more if this continues. On discussion about the fact that she has not responded to [facility]. I do think surgical intervention warranted and to be strongly considered, at least a 30% improvement would be life-changing to her and keep her out of a nursing home. Referral to Dr. Clois for his thoughts and treatment. No further medicines or ESI appear to have been any help over the last few months Parkinson's-really not part of her leg weakness and demise

## 2023-10-07 NOTE — Telephone Encounter (Signed)
 MRI has been loaded to her chart

## 2023-10-07 NOTE — Telephone Encounter (Signed)
 Reviewed with Dr. Clois and he reviewed her previous MRI from October 2024 and her notes from [facility].   She needs new imaging ordered. Can see me for phone visit tomorrow to get this done or she can see Dr. Clois at new patient opening on Thursday.

## 2023-10-07 NOTE — Telephone Encounter (Signed)
 Sent STAT powershare request to [facility]  11/27/22: MRI lumbar  Will send to Dr Clois for review after we receive images.

## 2023-10-07 NOTE — Telephone Encounter (Signed)
-----   Message from Eton M sent at 10/07/2023  9:12 AM EDT ----- Regarding: Scheduling Dr.Millers office called in to see about getting this patient into a closer appointment due to her being in pain and she is unable to get out of her wheelchair. I spoke with Glade and she informed me to check Dr.Yarboroughs Schedule. It does show he has an appointment for 09/04. Would it be alright to schedule her to this slot based off the appointment notes? The Phone number to the direct office is [phone].

## 2023-10-07 NOTE — Telephone Encounter (Signed)
 Patient scheduled for 08/29 and patient confirmed.

## 2023-10-08 ENCOUNTER — Encounter: Payer: Self-pay | Admitting: Orthopedic Surgery

## 2023-10-08 ENCOUNTER — Ambulatory Visit (INDEPENDENT_AMBULATORY_CARE_PROVIDER_SITE_OTHER): Admitting: Orthopedic Surgery

## 2023-10-08 DIAGNOSIS — M47816 Spondylosis without myelopathy or radiculopathy, lumbar region: Secondary | ICD-10-CM

## 2023-10-08 DIAGNOSIS — M4316 Spondylolisthesis, lumbar region: Secondary | ICD-10-CM | POA: Diagnosis not present

## 2023-10-08 DIAGNOSIS — M48061 Spinal stenosis, lumbar region without neurogenic claudication: Secondary | ICD-10-CM

## 2023-10-08 DIAGNOSIS — M5416 Radiculopathy, lumbar region: Secondary | ICD-10-CM

## 2023-10-12 ENCOUNTER — Other Ambulatory Visit: Payer: Self-pay

## 2023-10-12 ENCOUNTER — Emergency Department

## 2023-10-12 ENCOUNTER — Encounter: Payer: Self-pay | Admitting: Emergency Medicine

## 2023-10-12 ENCOUNTER — Inpatient Hospital Stay
Admission: EM | Admit: 2023-10-12 | Discharge: 2023-10-14 | DRG: 552 | Disposition: A | Discharge status: Skilled Nursing Facility | Attending: Internal Medicine | Admitting: Internal Medicine

## 2023-10-12 ENCOUNTER — Telehealth: Payer: Self-pay | Admitting: Orthopedic Surgery

## 2023-10-12 DIAGNOSIS — M79604 Pain in right leg: Secondary | ICD-10-CM | POA: Diagnosis present

## 2023-10-12 DIAGNOSIS — G20A1 Parkinson's disease without dyskinesia, without mention of fluctuations: Secondary | ICD-10-CM | POA: Diagnosis present

## 2023-10-12 DIAGNOSIS — F32A Depression, unspecified: Secondary | ICD-10-CM | POA: Diagnosis not present

## 2023-10-12 DIAGNOSIS — Z9841 Cataract extraction status, right eye: Secondary | ICD-10-CM

## 2023-10-12 DIAGNOSIS — R531 Weakness: Secondary | ICD-10-CM | POA: Diagnosis present

## 2023-10-12 DIAGNOSIS — R296 Repeated falls: Secondary | ICD-10-CM | POA: Diagnosis present

## 2023-10-12 DIAGNOSIS — Z9012 Acquired absence of left breast and nipple: Secondary | ICD-10-CM | POA: Diagnosis not present

## 2023-10-12 DIAGNOSIS — K219 Gastro-esophageal reflux disease without esophagitis: Secondary | ICD-10-CM | POA: Diagnosis present

## 2023-10-12 DIAGNOSIS — R262 Difficulty in walking, not elsewhere classified: Secondary | ICD-10-CM

## 2023-10-12 DIAGNOSIS — F0284 Dementia in other diseases classified elsewhere, unspecified severity, with anxiety: Secondary | ICD-10-CM | POA: Diagnosis present

## 2023-10-12 DIAGNOSIS — Z961 Presence of intraocular lens: Secondary | ICD-10-CM | POA: Diagnosis present

## 2023-10-12 DIAGNOSIS — S22080A Wedge compression fracture of T11-T12 vertebra, initial encounter for closed fracture: Principal | ICD-10-CM | POA: Diagnosis present

## 2023-10-12 DIAGNOSIS — S22089A Unspecified fracture of T11-T12 vertebra, initial encounter for closed fracture: Principal | ICD-10-CM | POA: Diagnosis present

## 2023-10-12 DIAGNOSIS — Z87891 Personal history of nicotine dependence: Secondary | ICD-10-CM | POA: Diagnosis not present

## 2023-10-12 DIAGNOSIS — E039 Hypothyroidism, unspecified: Secondary | ICD-10-CM | POA: Diagnosis present

## 2023-10-12 DIAGNOSIS — G8929 Other chronic pain: Secondary | ICD-10-CM | POA: Diagnosis present

## 2023-10-12 DIAGNOSIS — I714 Abdominal aortic aneurysm, without rupture, unspecified: Secondary | ICD-10-CM | POA: Diagnosis present

## 2023-10-12 DIAGNOSIS — Z923 Personal history of irradiation: Secondary | ICD-10-CM | POA: Diagnosis not present

## 2023-10-12 DIAGNOSIS — Z79899 Other long term (current) drug therapy: Secondary | ICD-10-CM

## 2023-10-12 DIAGNOSIS — Z79890 Hormone replacement therapy (postmenopausal): Secondary | ICD-10-CM

## 2023-10-12 DIAGNOSIS — Z9842 Cataract extraction status, left eye: Secondary | ICD-10-CM | POA: Diagnosis not present

## 2023-10-12 DIAGNOSIS — Z853 Personal history of malignant neoplasm of breast: Secondary | ICD-10-CM

## 2023-10-12 DIAGNOSIS — W19XXXA Unspecified fall, initial encounter: Secondary | ICD-10-CM | POA: Diagnosis present

## 2023-10-12 DIAGNOSIS — Z803 Family history of malignant neoplasm of breast: Secondary | ICD-10-CM

## 2023-10-12 DIAGNOSIS — M48061 Spinal stenosis, lumbar region without neurogenic claudication: Secondary | ICD-10-CM | POA: Diagnosis present

## 2023-10-12 DIAGNOSIS — Z8249 Family history of ischemic heart disease and other diseases of the circulatory system: Secondary | ICD-10-CM | POA: Diagnosis not present

## 2023-10-12 DIAGNOSIS — M79605 Pain in left leg: Secondary | ICD-10-CM | POA: Diagnosis present

## 2023-10-12 DIAGNOSIS — Z66 Do not resuscitate: Secondary | ICD-10-CM | POA: Diagnosis present

## 2023-10-12 DIAGNOSIS — I10 Essential (primary) hypertension: Secondary | ICD-10-CM | POA: Diagnosis present

## 2023-10-12 DIAGNOSIS — M4316 Spondylolisthesis, lumbar region: Secondary | ICD-10-CM | POA: Diagnosis present

## 2023-10-12 DIAGNOSIS — M4800 Spinal stenosis, site unspecified: Secondary | ICD-10-CM

## 2023-10-12 DIAGNOSIS — F419 Anxiety disorder, unspecified: Secondary | ICD-10-CM | POA: Diagnosis not present

## 2023-10-12 DIAGNOSIS — F0283 Dementia in other diseases classified elsewhere, unspecified severity, with mood disturbance: Secondary | ICD-10-CM | POA: Diagnosis present

## 2023-10-12 LAB — CBC
HCT: 47.4 % — ABNORMAL HIGH (ref 36.0–46.0)
Hemoglobin: 14.9 g/dL (ref 12.0–15.0)
MCH: 31.1 pg (ref 26.0–34.0)
MCHC: 31.4 g/dL (ref 30.0–36.0)
MCV: 99 fL (ref 80.0–100.0)
Platelets: 267 K/uL (ref 150–400)
RBC: 4.79 MIL/uL (ref 3.87–5.11)
RDW: 13 % (ref 11.5–15.5)
WBC: 7.2 K/uL (ref 4.0–10.5)
nRBC: 0 % (ref 0.0–0.2)

## 2023-10-12 LAB — COMPREHENSIVE METABOLIC PANEL WITH GFR
ALT: 6 U/L (ref 0–44)
AST: 22 U/L (ref 15–41)
Albumin: 3.9 g/dL (ref 3.5–5.0)
Alkaline Phosphatase: 68 U/L (ref 38–126)
Anion gap: 11 (ref 5–15)
BUN: 18 mg/dL (ref 8–23)
CO2: 28 mmol/L (ref 22–32)
Calcium: 9.7 mg/dL (ref 8.9–10.3)
Chloride: 105 mmol/L (ref 98–111)
Creatinine, Ser: 0.79 mg/dL (ref 0.44–1.00)
GFR, Estimated: >60 mL/min (ref >60)
Glucose, Bld: 103 mg/dL — ABNORMAL HIGH (ref 70–99)
Potassium: 3.9 mmol/L (ref 3.5–5.1)
Sodium: 144 mmol/L (ref 135–145)
Total Bilirubin: 0.5 mg/dL (ref 0.0–1.2)
Total Protein: 6.8 g/dL (ref 6.5–8.1)

## 2023-10-12 LAB — TROPONIN I (HIGH SENSITIVITY): Troponin I (High Sensitivity): 4 ng/L (ref <18)

## 2023-10-12 LAB — ETHANOL: Alcohol, Ethyl (B): <15 mg/dL (ref <15)

## 2023-10-12 MED ORDER — PANTOPRAZOLE SODIUM 40 MG PO TBEC
40.0000 mg | DELAYED_RELEASE_TABLET | Freq: Every day | ORAL | Status: DC
  Administered 2023-10-12 – 2023-10-14 (×3): 40 mg via ORAL
  Filled 2023-10-12 (×3): qty 1

## 2023-10-12 MED ORDER — CITALOPRAM HYDROBROMIDE 20 MG PO TABS
30.0000 mg | ORAL_TABLET | Freq: Every day | ORAL | Status: DC
  Administered 2023-10-12: 30 mg via ORAL
  Filled 2023-10-12: qty 2

## 2023-10-12 MED ORDER — LIDOCAINE 5 % EX PTCH
1.0000 | MEDICATED_PATCH | CUTANEOUS | Status: DC
  Administered 2023-10-12 – 2023-10-13 (×2): 1 via TRANSDERMAL
  Filled 2023-10-12 (×2): qty 1

## 2023-10-12 MED ORDER — ACETAMINOPHEN 500 MG PO TABS
1000.0000 mg | ORAL_TABLET | Freq: Once | ORAL | Status: AC
  Administered 2023-10-12: 1000 mg via ORAL
  Filled 2023-10-12: qty 2

## 2023-10-12 MED ORDER — LEVOTHYROXINE SODIUM 50 MCG PO TABS: 75.0000 ug | ORAL_TABLET | Freq: Every day | ORAL | Status: DC

## 2023-10-12 MED ORDER — ACETAMINOPHEN 325 MG PO TABS: 650.0000 mg | ORAL_TABLET | Freq: Four times a day (QID) | ORAL | Status: DC | PRN

## 2023-10-12 MED ORDER — CARBIDOPA-LEVODOPA ER 50-200 MG PO TBCR
1.0000 | EXTENDED_RELEASE_TABLET | Freq: Four times a day (QID) | ORAL | Status: DC
  Administered 2023-10-12: 1 via ORAL
  Filled 2023-10-12 (×2): qty 1

## 2023-10-12 MED ORDER — LIDOCAINE 5 % EX PTCH: 1.0000 | MEDICATED_PATCH | CUTANEOUS | Status: DC

## 2023-10-12 NOTE — Progress Notes (Signed)
 Orthopedic Tech Progress Note Patient Details:  Kathy Bean 1950/07/14 [id number]  Patient ID: [name], female   DOB: [birth year], 73 y.o.   MRN: [id number] Called in [employer] for TLSO brace.  Morna Pink 10/12/2023, 10:16 PM

## 2023-10-12 NOTE — ED Notes (Signed)
 Called ortho tech for tlso brace

## 2023-10-12 NOTE — ED Notes (Signed)
 Report off to [employer]

## 2023-10-12 NOTE — ED Triage Notes (Signed)
 Patient to ED via POV for generalized weakness. PT reports multiple falls recently. PT in the past week unable to walk- previously able to use a walker. Hx of back problems and parkinson. Has MRI scheduled tomorrow for her back. C/o right knee pain- ongoing problem per pt w/ sciatica on that side.

## 2023-10-12 NOTE — ED Provider Notes (Addendum)
 Assumed care at shift change.  Patient here after fall with generalized weakness, unable to ambulate.  Has an acute compression fracture of T12.  In TSLO brace.  Dr. Katrina with neurosurgery aware.  Will admit for physical therapy, placement.   Kathy Bean, Kathy SAILOR, DO 10/12/23 2354    [name], Kathy SAILOR, DO 10/12/23 2354

## 2023-10-12 NOTE — ED Provider Notes (Signed)
 Kathy Bean Provider Note    Event Date/Time   First MD Initiated Contact with Patient 10/12/23 1524     (approximate)   History   Weakness   HPI  Kathy Bean is a 73 y.o. female with history of hypertension, dementia, Parkinson's disease, chronic back pain with sciatica, presenting with worsening weakness.  Has had multiple years of lower back pain.  Also has bilateral leg aching.  Patient is scheduled for an MRI tomorrow for her back, has had multiple falls in the past month, last 1 was at least a week ago, previously able to use a walker but now unable to ambulate due to the weakness and pain.  No chest pain or headache, no incontinence, no urinary symptoms, cough, abdominal pain, nausea, vomiting, or diarrhea.  Not on any blood thinning medication.  She denies any focal weakness or numbness.  No saddle anesthesia.  No fevers or chills.  Per independent history from husband, they live at home, she was able to use a rolling walker previously, now is mostly in the wheelchair.  Patient states that when she walks her legs will give out.  States she hit her head on one of her falls, but that was at least a week ago.  No headache or vision changes, no nausea or vomiting.  Does have a healing scab to her left lower lateral leg.  On independent chart review, she is seen by neurosurgery on 28 August, has history of peripheral neuropathy, B12 deficiency, chronic EtOH use, referred to neurosurgery by Dr. Cleotilde from [place] [facility].  Has a history of lumbar decompression of L2 the L5 in 2015.  Has had intermittent lower back pain with intermittent bilateral leg pain and has had mobility issues for the last 5 years.  Has had a decline in last 2 years but has gotten much worse in the last 1 to 2 months.  Had been ambulating with a walker but is now using wheelchair.     Physical Exam   Triage Vital Signs: ED Triage Vitals  Encounter Vitals Group     BP 10/12/23 1430  138/87     Girls Systolic BP Percentile --      Girls Diastolic BP Percentile --      Boys Systolic BP Percentile --      Boys Diastolic BP Percentile --      Pulse Rate 10/12/23 1430 87     Resp 10/12/23 1430 18     Temp 10/12/23 1430 97.9 F (36.6 C)     Temp Source 10/12/23 1430 Oral     SpO2 10/12/23 1430 95 %     Weight 10/12/23 1431 178 lb (80.7 kg)     Height 10/12/23 1431 5' 7 (1.702 m)     Head Circumference --      Peak Flow --      Pain Score 10/12/23 1430 2     Pain Loc --      Pain Education --      Exclude from Growth Chart --     Most recent vital signs: Vitals:   10/12/23 1430 10/12/23 1952  BP: 138/87 120/73  Pulse: 87 81  Resp: 18 17  Temp: 97.9 F (36.6 C) 98 F (36.7 C)  SpO2: 95% 97%     General: Awake, no distress.  CV:  Good peripheral perfusion.  Resp:  Normal effort.  Abd:  No distention.  Soft nontender Other:  No palpable skull deformities  or tenderness, no facial deformities or tenderness, no midline spinal tenderness, she does have tenderness to the bilateral paralumbar region, no saddle anesthesia, no focal weakness or numbness, she does have palpable PT pulses bilaterally.  She is able to fully range her lower extremities.  No tenderness to her bilateral upper extremity.  She does have a healing laceration to her left lateral leg without bony tenderness, there is no erythema, induration, fluctuance, purulent drainage.   ED Results / Procedures / Treatments   Labs (all labs ordered are listed, but only abnormal results are displayed) Labs Reviewed  COMPREHENSIVE METABOLIC PANEL WITH GFR - Abnormal; Notable for the following components:      Result Value   Glucose, Bld 103 (*)    All other components within normal limits  CBC - Abnormal; Notable for the following components:   HCT 47.4 (*)    All other components within normal limits  ETHANOL  URINALYSIS, ROUTINE W REFLEX MICROSCOPIC  CBG MONITORING, ED  TROPONIN I (HIGH  SENSITIVITY)     EKG  EKG shows, sinus rhythm, rate 86, normal QS, normal QTc, no obvious ischemic ST elevation, T wave flattening in 3, aVL, not significant change compared to prior   RADIOLOGY On my independent interpretation, tib-fib x-ray without obvious fracture   PROCEDURES:  Critical Care performed: No  Procedures   MEDICATIONS ORDERED IN ED: Medications  lidocaine  (LIDODERM ) 5 % 1 patch (1 patch Transdermal Patch Applied 10/12/23 1602)  carbidopa -levodopa  (SINEMET  CR) 50-200 MG per tablet controlled release 1 tablet (has no administration in time range)  citalopram  (CELEXA ) tablet 30 mg (has no administration in time range)  levothyroxine  (SYNTHROID ) tablet 75 mcg (has no administration in time range)  pantoprazole  (PROTONIX ) EC tablet 40 mg (has no administration in time range)  acetaminophen  (TYLENOL ) tablet 1,000 mg (1,000 mg Oral Given 10/12/23 1601)     IMPRESSION / MDM / ASSESSMENT AND PLAN / ED COURSE  I reviewed the triage vital signs and the nursing notes.                              Differential diagnosis includes, but is not limited to, worsening radiculopathy, sciatica, consider cauda equina but she has no saddle anesthesia or incontinence.  Also considered deconditioning, worsening Parkinson's, electrolyte derangements, atypical ACS, disc herniation.  Denies any fevers, she is no midline spinal tenderness or new focal deficits to suggest discitis, osteomyelitis, epidural abscess.  Will get labs, EKG, MRI.  Discussed with her about pain control, she does take Tylenol , when asked if she takes oxycodone , her husband says no.  Will start with Tylenol  and Lidoderm  patch.  Will do this with neurosurgery once MRI results come back.  Patient's presentation is most consistent with acute presentation with potential threat to life or bodily function.  Independent interpretation of labs and imaging below.  Neurosurgery for MRI findings, recommended TLSO brace, no  additional interventions needed, he will follow-up with her outpatient.  Discussed with patient about imaging and lab results including incidental findings.  Husband says that they live at home, no additional help at home, unable to help her up the stairs given that she is now mostly wheelchair-bound.  Will keep her here as a border pending social work and PT OT, might need placement for rehab.  She is agreeable with this plan.  Will order her medications here.  TLSO brace ordered.    Clinical Course as of 10/12/23  2156  Tue Oct 12, 2023  1644 DG Tibia/Fibula Left Negative radiographs of the left tibia and fibula.  [TT]  1728 Independent review of labs, troponin ethanol levels not elevated, no leukocytosis, electrolytes not severely deranged, LFTs are normal. [TT]  2126 MR LUMBAR SPINE WO CONTRAST IMPRESSION: 1. Acute wedge compression fracture of T12 with less than 25% height loss and moderate bone marrow edema. Conservative treatment recommended. (AO Spine T12: A1) 2. Grade 1 anterolisthesis at L4-5 and grade 1 retrolisthesis at L5-S1. 3. Mild spinal canal stenosis at L4-5 without neural impingement. 4. Unchanged abdominal aortic aneurysm (3.2 cm) with eccentric mural thrombus. Surveillance imaging at a 3-year interval recommended.   [TT]    Clinical Course User Index [TT] Waymond, Lorelle Cummins, MD     FINAL CLINICAL IMPRESSION(S) / ED DIAGNOSES   Final diagnoses:  Compression fracture of T12 vertebra, initial encounter [facility])  Spinal stenosis, unspecified spinal region  Weakness     Rx / DC Orders   ED Discharge Orders     None        Note:  This document was prepared using Dragon voice recognition software and may include unintentional dictation errors.    Waymond Lorelle Cummins, MD 10/12/23 2158

## 2023-10-12 NOTE — Telephone Encounter (Signed)
 Patient's spouse called and left a voicemail during lunch and I called back to obtain further details. He states that his wife has gotten worse over the weekend and had a fall. He states that he and his granddaughter have to pick her up and pivot her and move her feet to help her walk. He states that she is barely able to get to the bathroom that is 7-8 feet from the chair. He called the patient's PCP and they advised that he contact EMS and have the patient transported to the ED. He finally states that she is on the way to the hospital and does not know if she will be keeping the appointment for the MRI scheduled for tomorrow. He just wanted to make our office aware.

## 2023-10-12 NOTE — ED Notes (Signed)
 Pt helped on to and off of bedpan and returned to a comfortable position.

## 2023-10-13 ENCOUNTER — Ambulatory Visit: Admission: RE | Admit: 2023-10-13 | Source: Ambulatory Visit

## 2023-10-13 ENCOUNTER — Inpatient Hospital Stay

## 2023-10-13 ENCOUNTER — Encounter: Payer: Self-pay | Admitting: Family Medicine

## 2023-10-13 DIAGNOSIS — F419 Anxiety disorder, unspecified: Secondary | ICD-10-CM | POA: Insufficient documentation

## 2023-10-13 DIAGNOSIS — K219 Gastro-esophageal reflux disease without esophagitis: Secondary | ICD-10-CM | POA: Insufficient documentation

## 2023-10-13 DIAGNOSIS — I10 Essential (primary) hypertension: Secondary | ICD-10-CM

## 2023-10-13 DIAGNOSIS — E039 Hypothyroidism, unspecified: Secondary | ICD-10-CM

## 2023-10-13 DIAGNOSIS — R262 Difficulty in walking, not elsewhere classified: Secondary | ICD-10-CM

## 2023-10-13 DIAGNOSIS — S22080A Wedge compression fracture of T11-T12 vertebra, initial encounter for closed fracture: Secondary | ICD-10-CM | POA: Diagnosis not present

## 2023-10-13 LAB — BASIC METABOLIC PANEL WITH GFR
Anion gap: 10 (ref 5–15)
BUN: 19 mg/dL (ref 8–23)
CO2: 27 mmol/L (ref 22–32)
Calcium: 9.5 mg/dL (ref 8.9–10.3)
Chloride: 104 mmol/L (ref 98–111)
Creatinine, Ser: 0.88 mg/dL (ref 0.44–1.00)
GFR, Estimated: >60 mL/min (ref >60)
Glucose, Bld: 119 mg/dL — ABNORMAL HIGH (ref 70–99)
Potassium: 3.8 mmol/L (ref 3.5–5.1)
Sodium: 141 mmol/L (ref 135–145)

## 2023-10-13 LAB — CBC
HCT: 46.8 % — ABNORMAL HIGH (ref 36.0–46.0)
Hemoglobin: 14.2 g/dL (ref 12.0–15.0)
MCH: 31.4 pg (ref 26.0–34.0)
MCHC: 30.3 g/dL (ref 30.0–36.0)
MCV: 103.5 fL — ABNORMAL HIGH (ref 80.0–100.0)
Platelets: 245 K/uL (ref 150–400)
RBC: 4.52 MIL/uL (ref 3.87–5.11)
RDW: 12.9 % (ref 11.5–15.5)
WBC: 8.5 K/uL (ref 4.0–10.5)
nRBC: 0 % (ref 0.0–0.2)

## 2023-10-13 MED ORDER — MAGNESIUM HYDROXIDE 400 MG/5ML PO SUSP: 30.0000 mL | Freq: Every day | ORAL | Status: DC | PRN

## 2023-10-13 MED ORDER — CARBIDOPA-LEVODOPA 25-100 MG PO TABS
2.0000 | ORAL_TABLET | Freq: Four times a day (QID) | ORAL | Status: DC
  Administered 2023-10-13 – 2023-10-14 (×5): 2 via ORAL
  Filled 2023-10-13 (×5): qty 2

## 2023-10-13 MED ORDER — ACETAMINOPHEN 325 MG PO TABS: 650.0000 mg | ORAL_TABLET | Freq: Four times a day (QID) | ORAL | Status: DC | PRN

## 2023-10-13 MED ORDER — TRIAMTERENE-HCTZ 37.5-25 MG PO TABS
2.0000 | ORAL_TABLET | Freq: Every day | ORAL | Status: DC
  Administered 2023-10-13 – 2023-10-14 (×2): 2 via ORAL
  Filled 2023-10-13 (×2): qty 2

## 2023-10-13 MED ORDER — TEMAZEPAM 7.5 MG PO CAPS: 7.5000 mg | ORAL_CAPSULE | Freq: Every evening | ORAL | Status: DC | PRN

## 2023-10-13 MED ORDER — ONDANSETRON HCL 4 MG/2ML IJ SOLN: 4.0000 mg | Freq: Four times a day (QID) | INTRAMUSCULAR | Status: DC | PRN

## 2023-10-13 MED ORDER — METHOCARBAMOL 500 MG PO TABS
500.0000 mg | ORAL_TABLET | Freq: Four times a day (QID) | ORAL | Status: DC | PRN
  Administered 2023-10-13 – 2023-10-14 (×3): 500 mg via ORAL
  Filled 2023-10-13 (×3): qty 1

## 2023-10-13 MED ORDER — MORPHINE SULFATE (PF) 2 MG/ML IV SOLN
2.0000 mg | INTRAVENOUS | Status: DC | PRN
  Administered 2023-10-13 – 2023-10-14 (×3): 2 mg via INTRAVENOUS
  Filled 2023-10-13 (×3): qty 1

## 2023-10-13 MED ORDER — ENTACAPONE 200 MG PO TABS: 200.0000 mg | ORAL_TABLET | Freq: Three times a day (TID) | ORAL | Status: DC

## 2023-10-13 MED ORDER — DULOXETINE HCL 20 MG PO CPEP: 20.0000 mg | ORAL_CAPSULE | Freq: Every day | ORAL | Status: DC

## 2023-10-13 MED ORDER — TRIAMCINOLONE ACETONIDE 55 MCG/ACT NA AERO: 2.0000 | INHALATION_SPRAY | Freq: Every day | NASAL | Status: DC

## 2023-10-13 MED ORDER — CITALOPRAM HYDROBROMIDE 20 MG PO TABS
20.0000 mg | ORAL_TABLET | Freq: Every day | ORAL | Status: DC
  Administered 2023-10-13: 20 mg via ORAL
  Filled 2023-10-13: qty 1

## 2023-10-13 MED ORDER — PANTOPRAZOLE SODIUM 40 MG PO TBEC: 40.0000 mg | DELAYED_RELEASE_TABLET | Freq: Every day | ORAL | Status: DC

## 2023-10-13 MED ORDER — POTASSIUM CHLORIDE CRYS ER 10 MEQ PO TBCR
10.0000 meq | EXTENDED_RELEASE_TABLET | Freq: Two times a day (BID) | ORAL | Status: DC
  Administered 2023-10-13 (×2): 10 meq via ORAL
  Filled 2023-10-13 (×2): qty 1

## 2023-10-13 MED ORDER — VITAMIN B-12 1000 MCG PO TABS
1000.0000 ug | ORAL_TABLET | Freq: Every day | ORAL | Status: DC
  Administered 2023-10-13 – 2023-10-14 (×2): 1000 ug via ORAL
  Filled 2023-10-13 (×2): qty 1

## 2023-10-13 MED ORDER — CALCIUM CARBONATE ANTACID 750 MG PO CHEW: 1.0000 | CHEWABLE_TABLET | Freq: Three times a day (TID) | ORAL | Status: DC

## 2023-10-13 MED ORDER — LORATADINE 10 MG PO TABS
10.0000 mg | ORAL_TABLET | Freq: Every day | ORAL | Status: DC
  Administered 2023-10-13: 10 mg via ORAL
  Filled 2023-10-13: qty 1

## 2023-10-13 MED ORDER — ENOXAPARIN SODIUM 40 MG/0.4ML IJ SOSY
40.0000 mg | PREFILLED_SYRINGE | INTRAMUSCULAR | Status: DC
  Administered 2023-10-13 – 2023-10-14 (×2): 40 mg via SUBCUTANEOUS
  Filled 2023-10-13 (×2): qty 0.4

## 2023-10-13 MED ORDER — ACETAMINOPHEN 650 MG RE SUPP: 650.0000 mg | Freq: Four times a day (QID) | RECTAL | Status: DC | PRN

## 2023-10-13 MED ORDER — INFLUENZA VAC SPLIT HIGH-DOSE 0.5 ML IM SUSY
0.5000 mL | PREFILLED_SYRINGE | INTRAMUSCULAR | Status: DC
  Filled 2023-10-13: qty 0.5

## 2023-10-13 MED ORDER — AZELASTINE HCL 0.1 % NA SOLN: 2.0000 | Freq: Two times a day (BID) | NASAL | Status: DC

## 2023-10-13 MED ORDER — ONDANSETRON HCL 4 MG PO TABS: 4.0000 mg | ORAL_TABLET | Freq: Four times a day (QID) | ORAL | Status: DC | PRN

## 2023-10-13 MED ORDER — LEVOTHYROXINE SODIUM 50 MCG PO TABS
75.0000 ug | ORAL_TABLET | Freq: Every day | ORAL | Status: DC
  Administered 2023-10-13 – 2023-10-14 (×2): 75 ug via ORAL
  Filled 2023-10-13 (×2): qty 1

## 2023-10-13 MED ORDER — AMLODIPINE BESYLATE 5 MG PO TABS
2.5000 mg | ORAL_TABLET | Freq: Every day | ORAL | Status: DC
  Administered 2023-10-13: 2.5 mg via ORAL
  Filled 2023-10-13: qty 1

## 2023-10-13 NOTE — Assessment & Plan Note (Addendum)
-   Will continue Xanax  as well as Celexa .

## 2023-10-13 NOTE — Assessment & Plan Note (Addendum)
-   This is secondary to fall. - Pain management will be provided. - The patient will be admitted to a medical-surgical bed. - Neurosurgery consult to be obtained. - Dr. Clois was notified about the patient. - The patient had a TLSO brace placed. - PT evaluation and management will be obtained.

## 2023-10-13 NOTE — Assessment & Plan Note (Signed)
-   Will continue Comtan  and Sinemet  IR.

## 2023-10-13 NOTE — Plan of Care (Signed)

## 2023-10-13 NOTE — Assessment & Plan Note (Addendum)
-   Will continue PPI therapy.

## 2023-10-13 NOTE — Evaluation (Signed)
 Physical Therapy Evaluation Patient Details Name: Kathy Bean MRN: [id number] DOB: 03/09/1950 Today's Date: 10/13/2023  History of Present Illness  Pt is a 73 y.o. female who presented to the ER with acute onset of recurrent falls over the last month with a recent fall and subsequent worsening low back pain. Found to have T12 compression fracture. PMH of essential hypertension, hypothyroidism, depression, osteoarthritis and Parkinson's disease, chronic back pain and sciatica  Clinical Impression  Pt sitting EOB with OT on my arrival, clearly uncomfortable but willing to try standing with +2 assist.  She was able to rise from significantly elevated bed and heavy +2 assist.  She struggled to get knees fully extended and failed to be able to take any steps or weight shifting efforts despite much unweighting assist and encouragement.  Pt needed heavy +2 assist to get back to supine and was clearly frustrated with her situation t/o the session.  Pt will benefit from continued PT to address functional limitations.        If plan is discharge home, recommend the following: Two people to help with walking and/or transfers;A lot of help with bathing/dressing/bathroom;Assistance with cooking/housework;Assist for transportation;Help with stairs or ramp for entrance   Can travel by private vehicle   No    Equipment Recommendations  (TBD)  Recommendations for Other Services       Functional Status Assessment Patient has had a recent decline in their functional status and demonstrates the ability to make significant improvements in function in a reasonable and predictable amount of time.     Precautions / Restrictions Precautions Precautions: Fall Recall of Precautions/Restrictions: Intact Restrictions Weight Bearing Restrictions Per Provider Order: No      Mobility  Bed Mobility Overal bed mobility: Needs Assistance Bed Mobility: Sit to Supine       Sit to supine: +2 for physical  assistance, Max assist   General bed mobility comments: Pt hesitant with essentially all movement, did do some bridging to scoot hips over in bed but minimal ability to assist with actual transitions    Transfers Overall transfer level: Needs assistance Equipment used: Rolling walker (2 wheels) Transfers: Sit to/from Stand Sit to Stand: From elevated surface, Max assist, +2 physical assistance           General transfer comment: Pt struggled to rise to standing with all attempts, finally with heavy +2 assist we managed to get to standing, but struggled to fully extend knees, clearly unsure about her strength and ability to maintain - quick to ask to sit 2/2 fatigue and pain    Ambulation/Gait               General Gait Details: unable to do any unweighting/weight shift with attempts at side stepping EOB, phyiscal assist t/o standing just to maintain upright  Stairs            Wheelchair Mobility     Tilt Bed    Modified Rankin (Stroke Patients Only)       Balance Overall balance assessment: Needs assistance Sitting-balance support: Feet supported, Bilateral upper extremity supported Sitting balance-Leahy Scale: Fair     Standing balance support: Reliant on assistive device for balance, Bilateral upper extremity supported Standing balance-Leahy Scale: Poor Standing balance comment: +2 assist t/o standing, poor tolerance, heavy UE reliance                             Pertinent Vitals/Pain Pain Assessment  Pain Assessment: Faces Faces Pain Scale: Hurts even more Pain Location: mid back    Home Living Family/patient expects to be discharged to:: Private residence Living Arrangements: Spouse/significant other;Children Available Help at Discharge: Family;Available 24 hours/day Type of Home: House Home Access: Stairs to enter Entrance Stairs-Rails: Lawyer of Steps: 3; and 4-5 to garage Alternate Level Stairs-Number  of Steps: full flight and chair lift Home Layout: Two level Home Equipment: Shower seat;Wheelchair - manual;Grab bars - tub/shower;Rollator (4 wheels) Additional Comments: reports she's in recliner most of the time    Prior Function Prior Level of Function : Needs assist;History of Falls (last six months)       Physical Assist : Mobility (physical);ADLs (physical)     Mobility Comments: RW, reports increased falls in last several months; 2 months since she really walked, so they have been pivoting her ADLs Comments: reports more difficulty with LB dressing last several months     Extremity/Trunk Assessment   Upper Extremity Assessment Upper Extremity Assessment: Generalized weakness    Lower Extremity Assessment Lower Extremity Assessment: Generalized weakness    Cervical / Trunk Assessment Cervical / Trunk Assessment:  (T12 compression fx)  Communication   Communication Communication: No apparent difficulties    Cognition Arousal: Alert Behavior During Therapy: WFL for tasks assessed/performed   PT - Cognitive impairments: No apparent impairments                           Following commands impaired: Follows one step commands with increased time     Cueing Cueing Techniques: Verbal cues, Tactile cues, Visual cues     General Comments General comments (skin integrity, edema, etc.): moves slow, easily distracted    Exercises     Assessment/Plan    PT Assessment Patient needs continued PT services  PT Problem List Decreased strength;Decreased range of motion;Decreased activity tolerance;Decreased mobility;Decreased balance;Decreased knowledge of use of DME;Decreased safety awareness;Decreased knowledge of precautions;Pain       PT Treatment Interventions DME instruction;Gait training;Functional mobility training;Therapeutic activities;Therapeutic exercise;Balance training;Patient/family education    PT Goals (Current goals can be found in the Care  Plan section)  Acute Rehab PT Goals Patient Stated Goal: control pain PT Goal Formulation: With patient Time For Goal Achievement: 10/26/23 Potential to Achieve Goals: Fair    Frequency Min 3X/week     Co-evaluation   Reason for Co-Treatment: To address functional/ADL transfers;For patient/therapist safety PT goals addressed during session: Mobility/safety with mobility OT goals addressed during session: ADL's and self-care       AM-PAC PT 6 Clicks Mobility  Outcome Measure Help needed turning from your back to your side while in a flat bed without using bedrails?: A Lot Help needed moving from lying on your back to sitting on the side of a flat bed without using bedrails?: Total Help needed moving to and from a bed to a chair (including a wheelchair)?: Total Help needed standing up from a chair using your arms (e.g., wheelchair or bedside chair)?: Total Help needed to walk in hospital room?: Total Help needed climbing 3-5 steps with a railing? : Total 6 Click Score: 7    End of Session Equipment Utilized During Treatment: Gait belt;Back brace Activity Tolerance: Patient limited by pain;Patient limited by fatigue Patient left: in bed;with call bell/phone within reach;with nursing/sitter in room Nurse Communication: Mobility status (pt requesting female assist with purewick/clean up/etc) PT Visit Diagnosis: Repeated falls (R29.6);Muscle weakness (generalized) (M62.81);Difficulty in walking, not  elsewhere classified (R26.2);Pain Pain - part of body:  (lumbago)    Time: 1001-1025 PT Time Calculation (min) (ACUTE ONLY): 24 min   Charges:   PT Evaluation $PT Eval Low Complexity: 1 Low   PT General Charges $$ ACUTE PT VISIT: 1 Visit         Carmin JONELLE Deed, DPT 10/13/2023, 1:18 PM

## 2023-10-13 NOTE — TOC Progression Note (Addendum)
 Transition of Care [facility]) - Progression Note    Patient Details  Name: Kathy Bean MRN: [id number] Date of Birth: 1950-04-27  Transition of Care [facility]) CM/SW Contact  Dalia GORMAN Fuse, RN Phone Number: 10/13/2023, 2:49 PM  Clinical Narrative:     TOC completed FL2 and obtained PASRR from [facility]. The patient's husband was a form Interior and spatial designer at [facility] [employer] and they would like for the patient to go there if possible. TOC sent FL2 to [facility].   DONALD [phone] A                  Expected Discharge Plan and Services                                               Social Drivers of Health (SDOH) Interventions SDOH Screenings   Food Insecurity: No Food Insecurity (10/13/2023)  Housing: Low Risk  (10/13/2023)  Transportation Needs: No Transportation Needs (10/13/2023)  Utilities: Not At Risk (10/13/2023)  Financial Resource Strain: Low Risk  (01/27/2023)   Received from [facility]  Social Connections: Socially Isolated (10/13/2023)  Tobacco Use: Medium Risk (10/13/2023)    Readmission Risk Interventions     No data to display

## 2023-10-13 NOTE — Progress Notes (Signed)
 PROGRESS NOTE    Kathy Bean  FMW:[id number] DOB: 01-12-1951 DOA: 10/12/2023 PCP: Cleotilde Oneil FALCON, MD   Assessment & Plan:   Principal Problem:   T12 compression fracture [facility]) Active Problems:   Essential hypertension   Hypothyroidism   Parkinson's disease (HCC)   GERD without esophagitis   Anxiety and depression  Assessment and Plan: T12 compression fracture: secondary to fall. Continue w/ TLSO brace. PT/OT recs SNF. Neuro surg following and recs apprec    HTN: continue on home dose of amlodipine    Hypothyroidism: continue on levothyroxine    Depression: severity unknown. Continue on home dose of citalopram    GERD: continue on PPI .   Parkinson's disease: continue on home dose of sinemet      DVT prophylaxis: loveneox  Code Status: DNR Family Communication: discussed pt's care w/ pt's husband, Oneil, and answered his questions Disposition Plan: likely d/c to SNF   Level of care: Med-Surg   Status is: Inpatient Remains inpatient appropriate because: severity of illness    Consultants:  Neuro surg   Procedures:   Antimicrobials:   Subjective: Pt c/o back pain & wet bed.   Objective: Vitals:   10/13/23 0134 10/13/23 0137 10/13/23 0317 10/13/23 0806  BP:  (!) 123/106 114/81 137/81  Pulse:  74 72 69  Resp:    15  Temp:  97.7 F (36.5 C) (!) 97.5 F (36.4 C) 97.6 F (36.4 C)  TempSrc:    Oral  SpO2:  97% 94% 95%  Weight: 81.4 kg     Height: 5' 7 (1.702 m)       Intake/Output Summary (Last 24 hours) at 10/13/2023 1006 Last data filed at 10/13/2023 0539 Gross per 24 hour  Intake 240 ml  Output --  Net 240 ml   Filed Weights   10/12/23 1431 10/13/23 0134  Weight: 80.7 kg 81.4 kg    Examination:  General exam: Appears uncomfortable and frustrated Respiratory system: Clear to auscultation. Respiratory effort normal. Cardiovascular system: S1 & S2 +. No rubs, gallops or clicks.  Gastrointestinal system: Abdomen is nondistended, soft and  nontender.  Normal bowel sounds heard. Central nervous system: Alert and oriented. Moves all extremities  Psychiatry: Judgement and insight appear normal. Frustrated mood and affect    Data Reviewed: I have personally reviewed following labs and imaging studies  CBC: Recent Labs  Lab 10/12/23 1434 10/13/23 0443  WBC 7.2 8.5  HGB 14.9 14.2  HCT 47.4* 46.8*  MCV 99.0 103.5*  PLT 267 245   Basic Metabolic Panel: Recent Labs  Lab 10/12/23 1434 10/13/23 0443  NA 144 141  K 3.9 3.8  CL 105 104  CO2 28 27  GLUCOSE 103* 119*  BUN 18 19  CREATININE 0.79 0.88  CALCIUM  9.7 9.5   GFR: Estimated Creatinine Clearance: 62.5 mL/min (by C-G formula based on SCr of 0.88 mg/dL). Liver Function Tests: Recent Labs  Lab 10/12/23 1434  AST 22  ALT 6  ALKPHOS 68  BILITOT 0.5  PROT 6.8  ALBUMIN 3.9   No results for input(s): LIPASE, AMYLASE in the last 168 hours. No results for input(s): AMMONIA in the last 168 hours. Coagulation Profile: No results for input(s): INR, PROTIME in the last 168 hours. Cardiac Enzymes: No results for input(s): CKTOTAL, CKMB, CKMBINDEX, TROPONINI in the last 168 hours. BNP (last 3 results) No results for input(s): PROBNP in the last 8760 hours. HbA1C: No results for input(s): HGBA1C in the last 72 hours. CBG: No results for input(s):  GLUCAP in the last 168 hours. Lipid Profile: No results for input(s): CHOL, HDL, LDLCALC, TRIG, CHOLHDL, LDLDIRECT in the last 72 hours. Thyroid  Function Tests: No results for input(s): TSH, T4TOTAL, FREET4, T3FREE, THYROIDAB in the last 72 hours. Anemia Panel: No results for input(s): VITAMINB12, FOLATE, FERRITIN, TIBC, IRON, RETICCTPCT in the last 72 hours. Sepsis Labs: No results for input(s): PROCALCITON, LATICACIDVEN in the last 168 hours.  No results found for this or any previous visit (from the past 240 hours).       Radiology Studies: MR  LUMBAR SPINE WO CONTRAST Result Date: 10/12/2023 EXAM: MRI LUMBAR SPINE 10/12/2023 07:47:08 PM TECHNIQUE: Multiplanar multisequence MRI of the lumbar spine was performed without the administration of intravenous contrast. COMPARISON: 12/07/2022 CLINICAL HISTORY: Lower back pain, bilateral leg aching. Patient reports multiple falls recently and has been unable to walk in the past week, previously able to use a walker. History of back problems and Parkinson's disease. Complains of right knee pain and ongoing sciatica on that side. FINDINGS: BONES AND ALIGNMENT: Wedge compression fracture of T12 with less than 25% height loss and moderate bone marrow edema, likely acute. According to the AO Spine classification of thoracolumbar injuries, the finding is consistent with a T12: A1 (wedge compression fracture with less than 25% height loss, no posterior wall involvement). Grade 1 anterolisthesis at L4-5 and grade 1 retrolisthesis at L5-S1. SPINAL CORD: The conus terminates normally. SOFT TISSUES: No paraspinal mass. L1-L2: No significant disc herniation. No spinal canal stenosis or neural foraminal narrowing. L2-L3: Small left asymmetric disc bulge. No central spinal canal or neural foraminal stenosis. L3-L4: Posterior decompression. Small subarticular disc protrusion. No spinal canal stenosis or neural foraminal narrowing. L4-L5: Moderate facet arthrosis and mild spinal canal stenosis. No neural impingement. L5-S1: Small central disc protrusion. No central spinal canal or neural foraminal stenosis. VASCULATURE: Unchanged size of infrarenal abdominal aortic aneurysm measuring 3.2 cm with eccentric mural thrombus. According to the ACR 2013 and SVS 2018 guidelines, the finding is consistent with a small abdominal aortic aneurysm (3.2 cm), and the recommendation is surveillance imaging at a 3-year interval. IMPRESSION: 1. Acute wedge compression fracture of T12 with less than 25% height loss and moderate bone marrow edema.  Conservative treatment recommended. (AO Spine T12: A1) 2. Grade 1 anterolisthesis at L4-5 and grade 1 retrolisthesis at L5-S1. 3. Mild spinal canal stenosis at L4-5 without neural impingement. 4. Unchanged abdominal aortic aneurysm (3.2 cm) with eccentric mural thrombus. Surveillance imaging at a 3-year interval recommended. Electronically signed by: Franky Stanford MD 10/12/2023 09:18 PM EDT RP Workstation: [id number]   DG Tibia/Fibula Left Result Date: 10/12/2023 CLINICAL DATA:  Status post fall a week ago with ongoing pain. EXAM: LEFT TIBIA AND FIBULA - 2 VIEW COMPARISON:  None Available. FINDINGS: There is no evidence of acute or healing fracture. Cortical margins are intact. No erosions or periostitis. No ankle or knee dislocation. No focal soft tissue abnormalities. IMPRESSION: Negative radiographs of the left tibia and fibula. Electronically Signed   By: Andrea Gasman M.D.   On: 10/12/2023 16:28        Scheduled Meds:  amLODipine   2.5 mg Oral QHS   carbidopa -levodopa   2 tablet Oral QID   citalopram   20 mg Oral Q2200   cyanocobalamin   1,000 mcg Oral Daily   enoxaparin  (LOVENOX ) injection  40 mg Subcutaneous Q24H   [START ON 10/14/2023] Influenza vac split trivalent PF  0.5 mL Intramuscular Tomorrow-1000   levothyroxine   75 mcg Oral Q0600   lidocaine   1 patch Transdermal Q24H   loratadine   10 mg Oral Daily   pantoprazole   40 mg Oral Daily   potassium chloride   10 mEq Oral BID   triamterene -hydrochlorothiazide   2 tablet Oral Daily   Continuous Infusions:   LOS: 1 day       Anthony CHRISTELLA Pouch, MD [facility] Pager [phone]  If 7PM-7AM, please contact night-coverage [email or web address] 10/13/2023, 10:06 AM

## 2023-10-13 NOTE — Assessment & Plan Note (Signed)
-   Will continue Synthroid .

## 2023-10-13 NOTE — Consult Note (Addendum)
 Consult requested by:  Dr. Neomi  Consult requested for:  T12 fracture  Primary Physician:  Cleotilde Oneil FALCON, MD  History of Present Illness: 10/13/2023 Kathy Bean is here today with a chief complaint of difficulty walking and what she reports as sciatica.  Her pain is in her lower back in the midline.    From talking with the patient and her husband, it sounds like she has been having considerable difficulty at home.  Her husband has been barely able to get her moving.  He reports that her gait is very magnetic.  She has had injections which have not helped.  Nothing has really helped her gait.  Her husband reports that she may have a foot drop.  Review of Systems:  A 10 point review of systems is negative, except for the pertinent positives and negatives detailed in the HPI.  Past Medical History: Past Medical History:  Diagnosis Date   Breast cancer (HCC) 11/2014   left   Cancer (HCC) 2016   left breast   Depression    Dysrhythmia    heart racing   Family history of adverse reaction to anesthesia    brother had difficulty going under anesthseia   Headache    hx of migraines   period induced   Hypertension    Hypothyroidism    Memory changes    Osteoarthritis    Parkinson disease (HCC)    Personal history of radiation therapy     Past Surgical History: Past Surgical History:  Procedure Laterality Date   BREAST BIOPSY Left 10/2014   positive   BREAST LUMPECTOMY Left 2016   invasive mammary   CATARACT EXTRACTION W/PHACO Right 11/27/2021   Procedure: CATARACT EXTRACTION PHACO AND INTRAOCULAR LENS PLACEMENT (IOC) RIGHT 6.56 00:47.9;  Surgeon: Enola Feliciano Hugger, MD;  Location: [facility];  Service: Ophthalmology;  Laterality: Right;   CATARACT EXTRACTION W/PHACO Left 12/11/2021   Procedure: CATARACT EXTRACTION PHACO AND INTRAOCULAR LENS PLACEMENT (IOC) LEFT 3.47 00:29.2;  Surgeon: Enola Feliciano Hugger, MD;  Location: [facility];   Service: Ophthalmology;  Laterality: Left;   COLONOSCOPY Left 07/17/2018   Procedure: COLONOSCOPY;  Surgeon: Janalyn Keene NOVAK, MD;  Location: [facility] ENDOSCOPY;  Service: Endoscopy;  Laterality: Left;   DILATION AND CURETTAGE OF UTERUS     ESOPHAGOGASTRODUODENOSCOPY Left 07/17/2018   Procedure: ESOPHAGOGASTRODUODENOSCOPY (EGD);  Surgeon: Janalyn Keene NOVAK, MD;  Location: [facility] ENDOSCOPY;  Service: Endoscopy;  Laterality: Left;   Laminectomy Posterior Cervicle Decomp W/Facetectomy & Foraminotomy     [id number] x 3...SABRASABRAEXCISION CENTRAL LEFT DISC HERNIATION L 2-3; Surgeon: Debby Lorrene Kaufmann, MD; Location: [facility] OR; Service: Orthopedics; Laterality: Left   Laminectomy Posterior Lumbar Facetectomy & Foraminotomy W/Decomp     BILATERAL LUMBAR DECOMPRESSION L2-3, L3-4, L4-5; Surgeon: Debby Lorrene Kaufmann, MD; Location: [facility] OR; Service: Orthopedics; Laterality: Bilateral;   PARTIAL MASTECTOMY WITH NEEDLE LOCALIZATION Left 11/29/2014   Procedure: PARTIAL MASTECTOMY WITH NEEDLE LOCALIZATION;  Surgeon: Larinda Unknown Sharps, MD;  Location: [facility] ORS;  Service: General;  Laterality: Left;   SENTINEL NODE BIOPSY Left 11/29/2014   Procedure: SENTINEL NODE BIOPSY;  Surgeon: Larinda Unknown Sharps, MD;  Location: [facility] ORS;  Service: General;  Laterality: Left;    Allergies: Allergies as of 10/12/2023   (No Known Allergies)    Medications: Current Meds  Medication Sig   acetaminophen  (TYLENOL ) 325 MG tablet Take 650 mg by mouth in the morning and at bedtime.   ALPRAZolam  (XANAX ) 0.5 MG tablet Take 0.5 mg  by mouth at bedtime as needed for sleep.   carbidopa -levodopa  (SINEMET  IR) 25-100 MG tablet Take 2 tablets by mouth 4 (four) times daily.   citalopram  (CELEXA ) 20 MG tablet Take 30 mg by mouth daily.   Cyanocobalamin  (RA VITAMIN B-12 TR) 1000 MCG TBCR Take 1,000 mcg by mouth daily.    fexofenadine (ALLEGRA) 180 MG tablet Take 180 mg by mouth daily.   levothyroxine  (SYNTHROID ) 75 MCG tablet Take 75 mcg by mouth daily  before breakfast.   omeprazole (PRILOSEC) 20 MG capsule Take 20 mg by mouth daily.   potassium chloride  (K-DUR) 10 MEQ tablet Take 10 mEq by mouth 2 (two) times daily.    triamterene -hydrochlorothiazide  (MAXZIDE -25) 37.5-25 MG tablet Take 2 tablets by mouth daily.     Social History: Social History   Tobacco Use   Smoking status: Former    Current packs/day: 0.00    Types: Cigarettes    Quit date: 11/22/1999    Years since quitting: 23.9   Smokeless tobacco: Never  Vaping Use   Vaping status: Never Used  Substance Use Topics   Alcohol use: Not Currently    Comment: Rare   Drug use: No    Family Medical History: Family History  Problem Relation Age of Onset   Breast cancer Maternal Aunt    Breast cancer Paternal Aunt    Coronary artery disease Mother    Heart attack Mother    Coronary artery disease Paternal Grandmother     Physical Examination: Vitals:   10/13/23 0317 10/13/23 0806  BP: 114/81 137/81  Pulse: 72 69  Resp:  15  Temp: (!) 97.5 F (36.4 C) 97.6 F (36.4 C)  SpO2: 94% 95%    General: Patient is in no apparent distress. Attention to examination is appropriate.  Neck:   Supple.  Full range of motion.  Respiratory: Patient is breathing without any difficulty.   NEUROLOGICAL:     Awake, alert, oriented.  Speech is clear and fluent.  Cranial Nerves: Pupils equal round and reactive to light.  Facial tone is symmetric.  Facial sensation is symmetric. Shoulder shrug is symmetric. Tongue protrusion is midline.  There is no pronator drift.  Strength: 5/5 throughout  Reflexes are 2+ and symmetric at the biceps, triceps, brachioradialis, patella and achilles.   Hoffman's is absent.   Bilateral upper and lower extremity sensation is intact to light touch.    No evidence of dysmetria noted.  Gait is untested.    +TTP B knees   Medical Decision Making  Imaging: MR L spine 10/12/2023 IMPRESSION: 1. Acute wedge compression fracture of T12 with  less than 25% height loss and moderate bone marrow edema. Conservative treatment recommended. (AO Spine T12: A1) 2. Grade 1 anterolisthesis at L4-5 and grade 1 retrolisthesis at L5-S1. 3. Mild spinal canal stenosis at L4-5 without neural impingement. 4. Unchanged abdominal aortic aneurysm (3.2 cm) with eccentric mural thrombus. Surveillance imaging at a 3-year interval recommended.   Electronically signed by: Franky Stanford MD 10/12/2023 09:18 PM EDT RP Workstation: [id number]  I have personally reviewed the images and agree with the above interpretation.  Assessment and Plan: Kathy Bean is a pleasant 73 y.o. female with difficulty walking which sounds most consistent with parkinsonism.  It is also possible that she has normal pressure hydrocephalus.  She has a T12 compression fracture currently.  We discussed the treatment options for the compression fracture.  She is currently being treated with a TLSO.  I offered her a kyphoplasty and  she declined.  I will order a thoracic spine MRI scan to ensure that she does not have a thoracic disc herniation or an additional compression fracture.  We discussed setting her up for high-volume tap to see if this will help with her balance and walking.  We may end up performing this during this hospitalization depending on how she recovers and what findings are on her MRI scan.  Her lumbar spine findings do not explain her difficulty walking, though it may contribute to her back pain with her anterolisthesis at L4-5 and significant joint effusions at that level.  I have communicated my recommendations to the requesting physician and coordinated care to facilitate these recommendations.     Zahrah Sutherlin K. Clois MD, [facility] Neurosurgery

## 2023-10-13 NOTE — [place] FL2 (Signed)
 [place]  MEDICAID FL2 LEVEL OF CARE FORM     IDENTIFICATION  Patient Name: Kathy Bean Birthdate: 01-11-1951 Sex: female Admission Date (Current Location): 10/12/2023  [facility] and [place] Number:  Chiropodist and Address:  [facility], [address] [place], [place] [address]      Provider Number: [id number]  Attending Physician Name and Address:  Trudy Anthony HERO, MD  Relative Name and Phone Number:  Evalynn, Hankins (Spouse)  [phone]    Current Level of Care: Hospital Recommended Level of Care: Skilled Nursing Facility Prior Approval Number:    Date Approved/Denied:   PASRR Number: [id number]  Discharge Plan: SNF    Current Diagnoses: Patient Active Problem List   Diagnosis Date Noted   Essential hypertension 10/13/2023   GERD without esophagitis 10/13/2023   Hypothyroidism 10/13/2023   Anxiety and depression 10/13/2023   T12 compression fracture (HCC) 10/12/2023   Chronic anemia 08/09/2018   External hemorrhoids    Hypertrophy of anal papillae    Loss of weight    Diverticulosis of large intestine without diverticulitis    Dehydration 07/15/2018   Parkinson's disease (HCC) 04/18/2018   Hyperlipidemia, mixed 10/27/2016   Benign essential hypertension 06/11/2016   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) 04/16/2015   Primary cancer of lower-inner quadrant of left female breast (HCC) 12/04/2014   Acquired spondylolisthesis 10/11/2013   B12 deficiency 10/02/2013   Lumbar disc disease 09/07/2013    Orientation RESPIRATION BLADDER Height & Weight     Self, Time, Situation, Place    Continent Weight: 81.4 kg Height:  5' 7 (170.2 cm)  BEHAVIORAL SYMPTOMS/MOOD NEUROLOGICAL BOWEL NUTRITION STATUS      Continent Diet (Heart)  AMBULATORY STATUS COMMUNICATION OF NEEDS Skin   Limited Assist Verbally                         Personal Care Assistance Level of Assistance  Bathing, Feeding,  Dressing Bathing Assistance: Limited assistance Feeding assistance: Independent Dressing Assistance: Limited assistance     Functional Limitations Info             SPECIAL CARE FACTORS FREQUENCY  PT (By licensed PT), OT (By licensed OT)     PT Frequency: 5 x week OT Frequency: 5 x week            Contractures      Additional Factors Info  Code Status, Allergies Code Status Info: DNR Allergies Info: NKA           Current Medications (10/13/2023):  This is the current hospital active medication list Current Facility-Administered Medications  Medication Dose Route Frequency Provider Last Rate Last Admin   acetaminophen  (TYLENOL ) tablet 650 mg  650 mg Oral Q6H PRN Mansy, Jan A, MD       Or   acetaminophen  (TYLENOL ) suppository 650 mg  650 mg Rectal Q6H PRN Mansy, Jan A, MD       amLODipine  (NORVASC ) tablet 2.5 mg  2.5 mg Oral QHS Mansy, Jan A, MD       carbidopa -levodopa  (SINEMET  IR) 25-100 MG per tablet immediate release 2 tablet  2 tablet Oral QID Mansy, Jan A, MD   2 tablet at 10/13/23 1428   citalopram  (CELEXA ) tablet 20 mg  20 mg Oral Q2200 Mansy, Jan A, MD       cyanocobalamin  (VITAMIN B12) tablet 1,000 mcg  1,000 mcg Oral Daily Mansy, Madison LABOR, MD   1,000  mcg at 10/13/23 0932   enoxaparin  (LOVENOX ) injection 40 mg  40 mg Subcutaneous Q24H Mansy, Jan A, MD   40 mg at 10/13/23 0932   [START ON 10/14/2023] Influenza vac split trivalent PF (FLUZONE HIGH-DOSE) injection 0.5 mL  0.5 mL Intramuscular Tomorrow-1000 Mansy, Jan A, MD       levothyroxine  (SYNTHROID ) tablet 75 mcg  75 mcg Oral Q0600 Mansy, Jan A, MD   75 mcg at 10/13/23 0509   lidocaine  (LIDODERM ) 5 % 1 patch  1 patch Transdermal Q24H Tan, Ting Xu, MD   1 patch at 10/12/23 1602   loratadine  (CLARITIN ) tablet 10 mg  10 mg Oral Daily Mansy, Jan A, MD       magnesium  hydroxide (MILK OF MAGNESIA) suspension 30 mL  30 mL Oral Daily PRN Mansy, Jan A, MD       methocarbamol  (ROBAXIN ) tablet 500 mg  500 mg Oral Q6H PRN  Mansy, Jan A, MD   500 mg at 10/13/23 9392   morphine  (PF) 2 MG/ML injection 2 mg  2 mg Intravenous Q4H PRN Mansy, Jan A, MD   2 mg at 10/13/23 9490   ondansetron  (ZOFRAN ) tablet 4 mg  4 mg Oral Q6H PRN Mansy, Jan A, MD       Or   ondansetron  (ZOFRAN ) injection 4 mg  4 mg Intravenous Q6H PRN Mansy, Jan A, MD       pantoprazole  (PROTONIX ) EC tablet 40 mg  40 mg Oral Daily Tan, Ting Xu, MD   40 mg at 10/13/23 0932   potassium chloride  (KLOR-CON  M) CR tablet 10 mEq  10 mEq Oral BID Mansy, Jan A, MD   10 mEq at 10/13/23 0932   triamterene -hydrochlorothiazide  (MAXZIDE -25) 37.5-25 MG per tablet 2 tablet  2 tablet Oral Daily Mansy, Jan A, MD   2 tablet at 10/13/23 9066     Discharge Medications: Please see discharge summary for a list of discharge medications.  Relevant Imaging Results:  Relevant Lab Results:   Additional Information SSN: [id number]  Dalia GORMAN Fuse, RN

## 2023-10-13 NOTE — Evaluation (Signed)
 Occupational Therapy Evaluation Patient Details Name: Kathy Bean MRN: [id number] DOB: 03-14-1950 Today's Date: 10/13/2023   History of Present Illness   Pt is a 73 y.o. female who presented to the ER with acute onset of recurrent falls over the last month with a recent fall and subsequent worsening low back pain. Found to have T12 compression fracture. PMH of essential hypertension, hypothyroidism, depression, osteoarthritis and Parkinson's disease, chronic back pain and sciatica     Clinical Impressions Pt was seen for OT evaluation this date with PT joining in for standing attempts which required +2 assist. PTA , pt reports living in a 2 level home with 4-5 STE and bil HR with her husband, daughter and 2 granddaughters. She reports prior to ~3 months ago, she was moving around better and able to assist with ADLs, but over the last few months she has needed assist from her husband for all tasks and has been unable to ambulate.  Pt presents with deficits in strength, pain, balance and activity tolerance, affecting safe and optimal ADL completion. She was educated on log roll technique and back precautions as well as use of TLSO. Pt currently requires Max A x1 for logroll to reach EOB, Max A x2 to return to supine at end of session. Pt is alert and oriented x4, however she requires increased time to process and answer questions. Max A needed for LB dressing to don socks and underwear at bed level, seated EOB and standing at EOB. TLSO was adjusted and placed on pt at bedside prior to standing attempts. Author attempted x3 STS from slightly elevated bed height with Total Assist only able to clear buttocks slightly and unable to extend knees. PT joined in and pt required Max A x2 to perform STS from significantly elevated bed height to RW and Min A to maintain standing balance. Pt unable to advance feet or take any lateral steps. Pt requires cues for hand/feet placement and general safety with all  transfers. Pt would benefit from skilled OT services to address noted impairments and functional limitations to maximize safety and independence while minimizing future risk of falls, injury, and readmission. Do anticipate the need for follow up OT services upon acute hospital DC.      If plan is discharge home, recommend the following:   Two people to help with walking and/or transfers;A lot of help with bathing/dressing/bathroom     Functional Status Assessment   Patient has had a recent decline in their functional status and demonstrates the ability to make significant improvements in function in a reasonable and predictable amount of time.     Equipment Recommendations   Other (comment) (defer)     Recommendations for Other Services         Precautions/Restrictions   Precautions Precautions: Fall Recall of Precautions/Restrictions: Intact Restrictions Weight Bearing Restrictions Per Provider Order: No     Mobility Bed Mobility Overal bed mobility: Needs Assistance Bed Mobility: Rolling, Sidelying to Sit, Sit to Supine Rolling: Mod assist, Used rails Sidelying to sit: Max assist, HOB elevated, Used rails   Sit to supine: Max assist, +2 for safety/equipment   General bed mobility comments: edu on logroll technique, increased time and effort for all aspects of bed mobility    Transfers Overall transfer level: Needs assistance Equipment used: Rolling walker (2 wheels) Transfers: Sit to/from Stand Sit to Stand: Max assist, +2 safety/equipment, From elevated surface           General transfer comment: attempted  x3 STS with 1 person assist and only able to minimally clear buttocks; PT joining for session and pt needed Max A x2 to extend knees and stand upright, then able to maintain standing position with Min A x1 but unable to progress to taking steps      Balance Overall balance assessment: Needs assistance Sitting-balance support: Feet supported,  Bilateral upper extremity supported Sitting balance-Leahy Scale: Fair     Standing balance support: Reliant on assistive device for balance, Bilateral upper extremity supported Standing balance-Leahy Scale: Poor Standing balance comment: heavy UE reliance on RW and +2 assist                           ADL either performed or assessed with clinical judgement   ADL Overall ADL's : Needs assistance/impaired                 Upper Body Dressing : Total assistance;Sitting Upper Body Dressing Details (indicate cue type and reason): to donn TLSO Lower Body Dressing: Minimal assistance;Bed level;Sit to/from stand;Sitting/lateral leans Lower Body Dressing Details (indicate cue type and reason): to donn bil socks and underwear                     Vision         Perception         Praxis         Pertinent Vitals/Pain Pain Assessment Pain Assessment: Faces Faces Pain Scale: Hurts a little bit Pain Location: back brace fitting as small as it could go, but pt still uncomfortable Pain Descriptors / Indicators: Discomfort Pain Intervention(s): Monitored during session, Repositioned     Extremity/Trunk Assessment Upper Extremity Assessment Upper Extremity Assessment: Generalized weakness   Lower Extremity Assessment Lower Extremity Assessment: Generalized weakness   Cervical / Trunk Assessment Cervical / Trunk Assessment:  (T12 compression fx)   Communication Communication Communication: No apparent difficulties   Cognition Arousal: Alert Behavior During Therapy: WFL for tasks assessed/performed               OT - Cognition Comments: h/o PD, increased processing and response times; but alert and oriented x4                 Following commands: Impaired Following commands impaired: Follows one step commands with increased time     Cueing  General Comments   Cueing Techniques: Verbal cues;Tactile cues;Gestural cues  moves slow, easily  distracted   Exercises Other Exercises Other Exercises: Edu on role of OT in acute setting, DC recommendations, TLSO wear and adjusted for best fit, back precautions, use of AE/AD for ADL performance.   Shoulder Instructions      Home Living Family/patient expects to be discharged to:: (P) Private residence Living Arrangements: (P) Spouse/significant other;Children Available Help at Discharge: (P) Family;Available 24 hours/day Type of Home: (P) House Home Access: (P) Stairs to enter Entrance Stairs-Number of Steps: (P) 3; and 4-5 to garage Entrance Stairs-Rails: (P) Left;Right Home Layout: (P) Two level Alternate Level Stairs-Number of Steps: (P) full flight and chair lift   Bathroom Shower/Tub: (P) Tub/shower unit         Home Equipment: (P) Shower seat;Wheelchair - manual;Grab bars - tub/shower;Rollator (4 wheels)   Additional Comments: (P) reports she's in recliner most of the time      Prior Functioning/Environment Prior Level of Function : (P) Needs assist;History of Falls (last six months)  Physical Assist : Mobility (physical);ADLs (physical)     Mobility Comments: (P) RW, reports increased falls in last several months; 2 months since she really walked, so they have been pivoting her ADLs Comments: (P) reports more difficulty with LB dressing last several months    OT Problem List: Decreased strength;Decreased activity tolerance;Impaired balance (sitting and/or standing);Pain   OT Treatment/Interventions: Self-care/ADL training;Therapeutic exercise;Balance training;Therapeutic activities;DME and/or AE instruction;Patient/family education      OT Goals(Current goals can be found in the care plan section)   Acute Rehab OT Goals Patient Stated Goal: improve pain and function OT Goal Formulation: With patient Time For Goal Achievement: 10/27/23 Potential to Achieve Goals: Fair ADL Goals Pt Will Perform Grooming: sitting;with set-up Pt Will Perform Lower  Body Dressing: sit to/from stand;sitting/lateral leans;with adaptive equipment;with min assist Pt Will Transfer to Toilet: with min assist;stand pivot transfer;bedside commode   OT Frequency:  Min 2X/week    Co-evaluation PT/OT/SLP Co-Evaluation/Treatment: Yes Reason for Co-Treatment: To address functional/ADL transfers;For patient/therapist safety PT goals addressed during session: Mobility/safety with mobility OT goals addressed during session: ADL's and self-care      AM-PAC OT 6 Clicks Daily Activity     Outcome Measure Help from another person eating meals?: None Help from another person taking care of personal grooming?: A Little Help from another person toileting, which includes using toliet, bedpan, or urinal?: A Lot Help from another person bathing (including washing, rinsing, drying)?: A Lot Help from another person to put on and taking off regular upper body clothing?: A Little Help from another person to put on and taking off regular lower body clothing?: A Lot 6 Click Score: 16   End of Session Equipment Utilized During Treatment: Gait belt;Rolling walker (2 wheels);Back brace Nurse Communication: Mobility status  Activity Tolerance: Patient tolerated treatment well Patient left: in bed;with call bell/phone within reach;with bed alarm set  OT Visit Diagnosis: Other abnormalities of gait and mobility (R26.89);Unsteadiness on feet (R26.81);Muscle weakness (generalized) (M62.81);Repeated falls (R29.6)                Time: 9079-8983 OT Time Calculation (min): 56 min Charges:  OT General Charges $OT Visit: 1 Visit OT Evaluation $OT Eval Moderate Complexity: 1 Mod OT Treatments $Self Care/Home Management : 23-37 mins  Rema Lievanos, OTR/L 10/13/23, 12:24 PM  Hakiem Malizia E Jaleil Renwick 10/13/2023, 12:19 PM

## 2023-10-13 NOTE — TOC Initial Note (Signed)
 Transition of Care [facility]) - Initial/Assessment Note    Patient Details  Name: Kathy Bean MRN: [id number] Date of Birth: 1950/11/09  [facility]) CM/SW Contact:    Kathy Rummer, LCSW Phone Number: 10/13/2023, 4:00 PM  Clinical Narrative:   Kathy Bean met with patient and spouse at bedside in room 108. Pt reports a desire for SNF placement at [facility]. According to pt and spouse, Kathy Bean was a prior Interior and spatial designer of [facility] and pt is very familiar with this facility. [place] FL2 form was completed and bed offer to [facility] was submitted.                  Expected Discharge Plan: Skilled Nursing Facility Barriers to Discharge: Continued Medical Work up   Patient Goals and CMS Choice            Expected Discharge Plan and Services   Discharge Planning Services: CM Consult Post Acute Care Choice: Skilled Nursing Facility Living arrangements for the past 2 months: Single Family Home                        SNF               Prior Living Arrangements/Services Living arrangements for the past 2 months: Single Family Home Lives with:: Spouse Patient language and need for interpreter reviewed:: No Do you feel safe going back to the place where you live?: Yes      Need for Family Participation in Patient Care: Yes (Comment) (Pt spouse involved in patient care.) Care giver support system in place?: Yes (comment)   Criminal Activity/Legal Involvement Pertinent to Current Situation/Hospitalization: No - Comment as needed  Activities of Daily Living   ADL Screening (condition at time of admission) Independently performs ADLs?: No Does the patient have a NEW difficulty with bathing/dressing/toileting/self-feeding that is expected to last >3 days?: Yes (Initiates electronic notice to provider for possible OT consult) Does the patient have a NEW difficulty with getting in/out of bed, walking, or climbing stairs that is expected to last >3 days?: Yes  (Initiates electronic notice to provider for possible PT consult) Does the patient have a NEW difficulty with communication that is expected to last >3 days?: No Is the patient deaf or have difficulty hearing?: No Does the patient have difficulty seeing, even when wearing glasses/contacts?: No Does the patient have difficulty concentrating, remembering, or making decisions?: No  Permission Sought/Granted                  Emotional Assessment Appearance:: Appears stated age Attitude/Demeanor/Rapport: Engaged Affect (typically observed): Pleasant Orientation: : Oriented to Self, Oriented to Place, Oriented to  Time, Fluctuating Orientation (Suspected and/or reported Sundowners)   Psych Involvement: No (comment)  Admission diagnosis:  Weakness [R53.1] T12 compression fracture (HCC) [S22.080A] Compression fracture of T12 vertebra, initial encounter (HCC) [S22.080A] Spinal stenosis, unspecified spinal region [M48.00] Patient Active Problem List   Diagnosis Date Noted   Essential hypertension 10/13/2023   GERD without esophagitis 10/13/2023   Hypothyroidism 10/13/2023   Anxiety and depression 10/13/2023   Difficulty walking 10/13/2023   T12 compression fracture (HCC) 10/12/2023   Chronic anemia 08/09/2018   External hemorrhoids    Hypertrophy of anal papillae    Loss of weight    Diverticulosis of large intestine without diverticulitis    Dehydration 07/15/2018   Parkinson's disease (HCC) 04/18/2018   Hyperlipidemia, mixed 10/27/2016   Benign essential  hypertension 06/11/2016   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 [place]  [address] Phone: [phone] Fax: [phone]     Social Drivers of Health (SDOH) Social History: SDOH Screenings   Food Insecurity: No Food Insecurity (10/13/2023)  Housing: Low Risk  (10/13/2023)  Transportation Needs: No Transportation Needs (10/13/2023)  Utilities: Not At Risk (10/13/2023)  Financial Resource Strain: Low Risk  (01/27/2023)   Received from [facility]  Social Connections: Socially Isolated (10/13/2023)  Tobacco Use: Medium Risk (10/13/2023)   SDOH Interventions:     Readmission Risk Interventions     No data to display

## 2023-10-13 NOTE — Assessment & Plan Note (Signed)
-   Will continue antihypertensive therapy.

## 2023-10-13 NOTE — H&P (Signed)
 [place]   PATIENT NAME: Kathy Bean    MR#:  [id number]  DATE OF BIRTH:  1950-07-02  DATE OF ADMISSION:  10/12/2023  PRIMARY CARE PHYSICIAN: Cleotilde Oneil FALCON, MD   Patient is coming from: Home  REQUESTING/REFERRING PHYSICIAN: Ward, Allean SAILOR, DO  CHIEF COMPLAINT:   Chief Complaint  Patient presents with   Weakness    HISTORY OF PRESENT ILLNESS:  Kathy Bean is a 73 y.o. Caucasian female with medical history significant for essential hypertension, hypothyroidism, depression, osteoarthritis and Parkinson's disease, chronic back pain and sciatica, who presented to the emergency room with acute onset of recurrent falls over the last month with a recent fall and subsequent worsening low back pain.  She was previously able to use a walker and is now unable to ambulate due to weakness of both lower extremities in addition to pain.  No saddle anesthesia, or worsening bilateral lower extremity paresthesias.  No chest pain or palpitations.  No cough or wheezing or dyspnea.  No dysuria, oliguria or hematuria or flank pain.  No headache or dizziness or blurred vision.  No tinnitus or vertigo.  No urinary or stool incontinence.  No bleeding diathesis.  ED Course: When she came to the ER, vital signs were within normal.  Labs revealed normal CMP.  CBC showed no significant abnormalities.  Alcohol level was less than 15. EKG as reviewed by me : EKG showed normal sinus rhythm with rate 86 with left axis deviation. Imaging: Left tib-fib x-ray came back negative lumbar spine MRI without contrast revealed the following: 1. Acute wedge compression fracture of T12 with less than 25% height loss and moderate bone marrow edema. Conservative treatment recommended. (AO Spine T12: A1) 2. Grade 1 anterolisthesis at L4-5 and grade 1 retrolisthesis at L5-S1. 3. Mild spinal canal stenosis at L4-5 without neural impingement. 4. Unchanged abdominal aortic aneurysm (3.2 cm) with eccentric mural  thrombus. Surveillance imaging at a 3-year interval recommended.  The patient was given 1 g of p.o. Tylenol .  He had a TLSO brace placed after phone consultation with Dr. Clois.  She will be admitted to a medical-surgical bed for further evaluation and management.    PAST MEDICAL HISTORY:   Past Medical History:  Diagnosis Date   Breast cancer (HCC) 11/2014   left   Cancer (HCC) 2016   left breast   Depression    Dysrhythmia    heart racing   Family history of adverse reaction to anesthesia    brother had difficulty going under anesthseia   Headache    hx of migraines   period induced   Hypertension    Hypothyroidism    Memory changes    Osteoarthritis    Parkinson disease (HCC)    Personal history of radiation therapy     PAST SURGICAL HISTORY:   Past Surgical History:  Procedure Laterality Date   BREAST BIOPSY Left 10/2014   positive   BREAST LUMPECTOMY Left 2016   invasive mammary   CATARACT EXTRACTION W/PHACO Right 11/27/2021   Procedure: CATARACT EXTRACTION PHACO AND INTRAOCULAR LENS PLACEMENT (IOC) RIGHT 6.56 00:47.9;  Surgeon: Enola Feliciano Hugger, MD;  Location: [facility];  Service: Ophthalmology;  Laterality: Right;   CATARACT EXTRACTION W/PHACO Left 12/11/2021   Procedure: CATARACT EXTRACTION PHACO AND INTRAOCULAR LENS PLACEMENT (IOC) LEFT 3.47 00:29.2;  Surgeon: Enola Feliciano Hugger, MD;  Location: [facility];  Service: Ophthalmology;  Laterality: Left;   COLONOSCOPY Left 07/17/2018   Procedure:  COLONOSCOPY;  Surgeon: Janalyn Keene NOVAK, MD;  Location: [facility] END[facility];  Service: Endoscopy;  Laterality: Left;   DILATION AND CURETTAGE OF UTERUS     ESOPHAGOGASTRODUODENOSCOPY Left 07/17/2018   Procedure: ESOPHAGOGASTRODUODENOSCOPY (EGD);  Surgeon: Janalyn Keene NOVAK, MD;  Location: [facility] END[facility]COPY;  Service: Endoscopy;  Laterality: Left;   Laminectomy Posterior Cervicle Decomp W/Facetectomy & Foraminotomy     [id number] x 3...SABRASABRAEXCISION CENTRAL  LEFT DISC HERNIATION L 2-3; Surgeon: Debby Lorrene Kaufmann, MD; Location: [facility] OR; Service: Orthopedics; Laterality: Left   Laminectomy Posterior Lumbar Facetectomy & Foraminotomy W/Decomp     BILATERAL LUMBAR DECOMPRESSION L2-3, L3-4, L4-5; Surgeon: Debby Lorrene Kaufmann, MD; Location: [facility] OR; Service: Orthopedics; Laterality: Bilateral;   PARTIAL MASTECTOMY WITH NEEDLE LOCALIZATION Left 11/29/2014   Procedure: PARTIAL MASTECTOMY WITH NEEDLE LOCALIZATION;  Surgeon: Larinda Unknown Sharps, MD;  Location: [facility];  Service: General;  Laterality: Left;   SENTINEL NODE BIOPSY Left 11/29/2014   Procedure: SENTINEL NODE BIOPSY;  Surgeon: Larinda Unknown Sharps, MD;  Location: [facility];  Service: General;  Laterality: Left;    SOCIAL HISTORY:   Social History   Tobacco Use   Smoking status: Former    Current packs/day: 0.00    Types: Cigarettes    Quit date: 11/22/1999    Years since quitting: 23.9   Smokeless tobacco: Never  Substance Use Topics   Alcohol use: Not Currently    Comment: Rare    FAMILY HISTORY:   Family History  Problem Relation Age of Onset   Breast cancer Maternal Aunt    Breast cancer Paternal Aunt    Coronary artery disease Mother    Heart attack Mother    Coronary artery disease Paternal Grandmother     DRUG ALLERGIES:  No Known Allergies  REVIEW OF SYSTEMS:   ROS As per history of present illness. All pertinent systems were reviewed above. Constitutional, HEENT, cardiovascular, respiratory, GI, GU, musculoskeletal, neuro, psychiatric, endocrine, integumentary and hematologic systems were reviewed and are otherwise negative/unremarkable except for positive findings mentioned above in the HPI.   MEDICATIONS AT HOME:   Prior to Admission medications   Medication Sig Start Date End Date Taking? Authorizing Provider  acetaminophen  (TYLENOL ) 325 MG tablet Take 650 mg by mouth in the morning and at bedtime.    [provider]  amLODipine  (NORVASC ) 5 MG tablet  Take 2.5 mg by mouth at bedtime.    [provider]  azelastine  (ASTELIN ) 0.1 % nasal spray Place into both nostrils 2 (two) times daily. Use in each nostril as directed    [provider]  calcium  carbonate (TUMS EX) 750 MG chewable tablet Chew 1 tablet by mouth 3 (three) times daily.    [provider]  carbidopa -levodopa  (SINEMET  IR) 25-100 MG tablet Take 2 tablets by mouth 4 (four) times daily.    [provider]  citalopram  (CELEXA ) 20 MG tablet Take 1 tablet by mouth 1 day or 1 dose. 08/01/18   [provider]  Cyanocobalamin  (RA VITAMIN B-12 TR) 1000 MCG TBCR Take 1,000 mcg by mouth daily.     [provider]  DULoxetine  (CYMBALTA ) 20 MG capsule Take 20 mg by mouth daily.    [provider]  entacapone  (COMTAN ) 200 MG tablet Take 200 mg by mouth 3 (three) times daily.    [provider]  fexofenadine (ALLEGRA) 180 MG tablet Take 180 mg by mouth daily.    [provider]  levothyroxine  (SYNTHROID ) 75 MCG tablet Take 75 mcg by mouth daily  before breakfast.    [provider]  omeprazole (PRILOSEC) 20 MG capsule Take 20 mg by mouth daily.    [provider]  potassium chloride  (K-DUR) 10 MEQ tablet Take 10 mEq by mouth 2 (two) times daily.     [provider]  temazepam  (RESTORIL ) 7.5 MG capsule Take 7.5 mg by mouth at bedtime as needed for sleep.    [provider]  triamcinolone  (NASACORT  ALLERGY 24HR) 55 MCG/ACT AERO nasal inhaler Place 2 sprays into the nose daily.    [provider]  triamterene -hydrochlorothiazide  (MAXZIDE -25) 37.5-25 MG tablet Take 2 tablets by mouth daily.     [provider]      VITAL SIGNS:  Blood pressure 114/81, pulse 72, temperature (!) 97.5 F (36.4 C), resp. rate 17, height 5' 7 (1.702 m), weight 81.4 kg, SpO2 94%.  PHYSICAL EXAMINATION:  Physical Exam  GENERAL:  73 y.o.-year-old Caucasian female patient lying in the bed  with no acute distress.  EYES: Pupils equal, round, reactive to light and accommodation. No scleral icterus. Extraocular muscles intact.  HEENT: Head atraumatic, normocephalic. Oropharynx and nasopharynx clear.  NECK:  Supple, no jugular venous distention. No thyroid  enlargement, no tenderness.  LUNGS: Normal breath sounds bilaterally, no wheezing, rales,rhonchi or crepitation. No use of accessory muscles of respiration.  CARDIOVASCULAR: Regular rate and rhythm, S1, S2 normal. No murmurs, rubs, or gallops.  ABDOMEN: Soft, nondistended, nontender. Bowel sounds present. No organomegaly or mass.  EXTREMITIES: No pedal edema, cyanosis, or clubbing.  NEUROLOGIC: Cranial nerves II through XII are intact. Muscle strength 5/5 in all extremities. Sensation intact. Gait not checked. Musculoskeletal, tenderness at T12 level. PSYCHIATRIC: The patient is alert and oriented x 3.  Normal affect and good eye contact. SKIN: No obvious rash, lesion, or ulcer.   LABORATORY PANEL:   CBC Recent Labs  Lab 10/13/23 0443  WBC 8.5  HGB 14.2  HCT 46.8*  PLT 245   ------------------------------------------------------------------------------------------------------------------  Chemistries  Recent Labs  Lab 10/12/23 1434 10/13/23 0443  NA 144 141  K 3.9 3.8  CL 105 104  CO2 28 27  GLUCOSE 103* 119*  BUN 18 19  CREATININE 0.79 0.88  CALCIUM  9.7 9.5  AST 22  --   ALT 6  --   ALKPHOS 68  --   BILITOT 0.5  --    ------------------------------------------------------------------------------------------------------------------  Cardiac Enzymes No results for input(s): TROPONINI in the last 168 hours. ------------------------------------------------------------------------------------------------------------------  RADIOLOGY:  MR LUMBAR SPINE WO CONTRAST Result Date: 10/12/2023 EXAM: MRI LUMBAR SPINE 10/12/2023 07:47:08 PM TECHNIQUE: Multiplanar multisequence MRI of the lumbar spine was performed  without the administration of intravenous contrast. COMPARISON: 12/07/2022 CLINICAL HISTORY: Lower back pain, bilateral leg aching. Patient reports multiple falls recently and has been unable to walk in the past week, previously able to use a walker. History of back problems and Parkinson's disease. Complains of right knee pain and ongoing sciatica on that side. FINDINGS: BONES AND ALIGNMENT: Wedge compression fracture of T12 with less than 25% height loss and moderate bone marrow edema, likely acute. According to the AO Spine classification of thoracolumbar injuries, the finding is consistent with a T12: A1 (wedge compression fracture with less than 25% height loss, no posterior wall involvement). Grade 1 anterolisthesis at L4-5 and grade 1 retrolisthesis at L5-S1. SPINAL CORD: The conus terminates normally. SOFT TISSUES: No paraspinal mass. L1-L2: No significant disc herniation. No spinal canal stenosis or neural foraminal narrowing. L2-L3: Small left asymmetric disc bulge. No central spinal canal  or neural foraminal stenosis. L3-L4: Posterior decompression. Small subarticular disc protrusion. No spinal canal stenosis or neural foraminal narrowing. L4-L5: Moderate facet arthrosis and mild spinal canal stenosis. No neural impingement. L5-S1: Small central disc protrusion. No central spinal canal or neural foraminal stenosis. VASCULATURE: Unchanged size of infrarenal abdominal aortic aneurysm measuring 3.2 cm with eccentric mural thrombus. According to the ACR 2013 and SVS 2018 guidelines, the finding is consistent with a small abdominal aortic aneurysm (3.2 cm), and the recommendation is surveillance imaging at a 3-year interval. IMPRESSION: 1. Acute wedge compression fracture of T12 with less than 25% height loss and moderate bone marrow edema. Conservative treatment recommended. (AO Spine T12: A1) 2. Grade 1 anterolisthesis at L4-5 and grade 1 retrolisthesis at L5-S1. 3. Mild spinal canal stenosis at L4-5 without  neural impingement. 4. Unchanged abdominal aortic aneurysm (3.2 cm) with eccentric mural thrombus. Surveillance imaging at a 3-year interval recommended. Electronically signed by: Franky Stanford MD 10/12/2023 09:18 PM EDT RP Workstation: [id number]   DG Tibia/Fibula Left Result Date: 10/12/2023 CLINICAL DATA:  Status post fall a week ago with ongoing pain. EXAM: LEFT TIBIA AND FIBULA - 2 VIEW COMPARISON:  None Available. FINDINGS: There is no evidence of acute or healing fracture. Cortical margins are intact. No erosions or periostitis. No ankle or knee dislocation. No focal soft tissue abnormalities. IMPRESSION: Negative radiographs of the left tibia and fibula. Electronically Signed   By: Andrea Gasman M.D.   On: 10/12/2023 16:28      IMPRESSION AND PLAN:  Assessment and Plan: * T12 compression fracture (HCC) - This is secondary to fall. - Pain management will be provided. - The patient will be admitted to a medical-surgical bed. - Neurosurgery consult to be obtained. - Dr. Clois was notified about the patient. - The patient had a TLSO brace placed. - PT evaluation and management will be obtained.  Essential hypertension - Will continue antihypertensive therapy.  Hypothyroidism - Will continue Synthroid .  Anxiety and depression - Will continue Xanax  as well as Celexa .  GERD without esophagitis - Will continue PPI therapy.  Parkinson's disease (HCC) - Will continue Comtan  and Sinemet  IR.    DVT prophylaxis: Lovenox . Advanced Care Planning:  Code Status: The patient is DNR and DNI.  This was discussed with him and his wife. Family Communication:  The plan of care was discussed in details with the patient (and family). I answered all questions. The patient agreed to proceed with the above mentioned plan. Further management will depend upon hospital course. Disposition Plan: Back to previous home environment Consults called: Neurosurgery All the records are reviewed and  case discussed with ED provider.  Status is: Inpatient  At the time of the admission, it appears that the appropriate admission status for this patient is inpatient.  This is judged to be reasonable and necessary in order to provide the required intensity of service to ensure the patient's safety given the presenting symptoms, physical exam findings and initial radiographic and laboratory data in the context of comorbid conditions.  The patient requires inpatient status due to high intensity of service, high risk of further deterioration and high frequency of surveillance required.  I certify that at the time of admission, it is my clinical judgment that the patient will require inpatient hospital care extending more than 2 midnights.                            Dispo: The patient is  from: Home              Anticipated d/c is to: Home              Patient currently is not medically stable to d/c.              Difficult to place patient: No  Madison DELENA Peaches M.D on 10/13/2023 at 6:21 AM  [facility]   From 7 PM-7 AM, contact night-coverage [email or web address]  CC: Primary care physician; Cleotilde Oneil FALCON, MD

## 2023-10-14 ENCOUNTER — Telehealth: Payer: Self-pay | Admitting: Orthopedic Surgery

## 2023-10-14 ENCOUNTER — Other Ambulatory Visit: Payer: Self-pay | Admitting: Nurse Practitioner

## 2023-10-14 DIAGNOSIS — S22080A Wedge compression fracture of T11-T12 vertebra, initial encounter for closed fracture: Secondary | ICD-10-CM | POA: Diagnosis not present

## 2023-10-14 LAB — BASIC METABOLIC PANEL WITH GFR
Anion gap: 11 (ref 5–15)
BUN: 14 mg/dL (ref 8–23)
CO2: 27 mmol/L (ref 22–32)
Calcium: 9.7 mg/dL (ref 8.9–10.3)
Chloride: 102 mmol/L (ref 98–111)
Creatinine, Ser: 0.84 mg/dL (ref 0.44–1.00)
GFR, Estimated: >60 mL/min (ref >60)
Glucose, Bld: 117 mg/dL — ABNORMAL HIGH (ref 70–99)
Potassium: 3.4 mmol/L — ABNORMAL LOW (ref 3.5–5.1)
Sodium: 140 mmol/L (ref 135–145)

## 2023-10-14 MED ORDER — ALPRAZOLAM 0.5 MG PO TABS: 0.5000 mg | ORAL_TABLET | Freq: Every evening | ORAL | 0 refills | Status: DC | PRN

## 2023-10-14 MED ORDER — POTASSIUM CHLORIDE CRYS ER 20 MEQ PO TBCR
40.0000 meq | EXTENDED_RELEASE_TABLET | Freq: Once | ORAL | Status: AC
  Administered 2023-10-14: 40 meq via ORAL
  Filled 2023-10-14: qty 2

## 2023-10-14 MED ORDER — OXYCODONE-ACETAMINOPHEN 5-325 MG PO TABS: 1.0000 | ORAL_TABLET | Freq: Four times a day (QID) | ORAL | 0 refills | Status: DC | PRN

## 2023-10-14 MED ORDER — OXYCODONE-ACETAMINOPHEN 5-325 MG PO TABS: 1.0000 | ORAL_TABLET | Freq: Four times a day (QID) | ORAL | 0 refills | Status: AC | PRN

## 2023-10-14 MED ORDER — ALPRAZOLAM 0.5 MG PO TABS: 0.5000 mg | ORAL_TABLET | Freq: Every evening | ORAL | 0 refills | Status: AC | PRN

## 2023-10-14 NOTE — Plan of Care (Signed)

## 2023-10-14 NOTE — Discharge Summary (Signed)
 Physician Discharge Summary  [name] FMW:[id number] DOB: 01/17/1951 DOA: 10/12/2023  PCP: Cleotilde Oneil FALCON, MD  Admit date: 10/12/2023 Discharge date: 10/14/2023  Admitted From: home  Disposition:  SNF  Recommendations for Outpatient Follow-up:  Follow up with PCP in 1-2 weeks F/u w/ neuro surg, Dr. Clois, in 1-2 weeks  Home Health: no Equipment/Devices:  Discharge Condition: stable  CODE STATUS:DNR Diet recommendation:  Regular  Brief/Interim Summary: HPI was taken from Dr. Lawence: Kathy Bean is a 73 y.o. Caucasian female with medical history significant for essential hypertension, hypothyroidism, depression, osteoarthritis and Parkinson's disease, chronic back pain and sciatica, who presented to the emergency room with acute onset of recurrent falls over the last month with a recent fall and subsequent worsening low back pain.  She was previously able to use a walker and is now unable to ambulate due to weakness of both lower extremities in addition to pain.  No saddle anesthesia, or worsening bilateral lower extremity paresthesias.  No chest pain or palpitations.  No cough or wheezing or dyspnea.  No dysuria, oliguria or hematuria or flank pain.  No headache or dizziness or blurred vision.  No tinnitus or vertigo.  No urinary or stool incontinence.  No bleeding diathesis.   ED Course: When she came to the ER, vital signs were within normal.  Labs revealed normal CMP.  CBC showed no significant abnormalities.  Alcohol level was less than 15. EKG as reviewed by me : EKG showed normal sinus rhythm with rate 86 with left axis deviation. Imaging: Left tib-fib x-ray came back negative lumbar spine MRI without contrast revealed the following: 1. Acute wedge compression fracture of T12 with less than 25% height loss and moderate bone marrow edema. Conservative treatment recommended. (AO Spine T12: A1) 2. Grade 1 anterolisthesis at L4-5 and grade 1 retrolisthesis at L5-S1. 3. Mild  spinal canal stenosis at L4-5 without neural impingement. 4. Unchanged abdominal aortic aneurysm (3.2 cm) with eccentric mural thrombus. Surveillance imaging at a 3-year interval recommended.   The patient was given 1 g of p.o. Tylenol .  He had a TLSO brace placed after phone consultation with Dr. Clois.  She will be admitted to a medical-surgical bed for further evaluation and management.  Discharge Diagnoses:  Principal Problem:   T12 compression fracture (HCC) Active Problems:   Essential hypertension   Hypothyroidism   Parkinson's disease (HCC)   GERD without esophagitis   Anxiety and depression   Difficulty walking T12 compression fracture: secondary to fall. Continue w/ TLSO brace. PT/OT recs SNF. Neuro surg following and recs apprec    HTN: continue on home dose of amlodipine    Hypothyroidism: continue on levothyroxine    Depression: severity unknown. Continue on home dose of citalopram    GERD: continue on PPI .   Parkinson's disease: continue on home dose of sinemet    Discharge Instructions  Discharge Instructions     Diet general   Complete by: As directed    Discharge instructions   Complete by: As directed    F/u w/ PCP in 1-2 weeks. F/u w/ neuro surg, Dr. Clois, in 2-3 weeks   Increase activity slowly   Complete by: As directed       Allergies as of 10/14/2023   No Known Allergies      Medication List     TAKE these medications    acetaminophen  325 MG tablet Commonly known as: TYLENOL  Take 650 mg by mouth in the morning and at bedtime.   ALPRAZolam   0.5 MG tablet Commonly known as: XANAX  Take 1 tablet (0.5 mg total) by mouth at bedtime as needed for up to 2 days for sleep.   carbidopa -levodopa  25-100 MG tablet Commonly known as: SINEMET  IR Take 2 tablets by mouth 4 (four) times daily.   citalopram  20 MG tablet Commonly known as: CELEXA  Take 30 mg by mouth daily.   fexofenadine 180 MG tablet Commonly known as: ALLEGRA Take 180 mg  by mouth daily.   levothyroxine  75 MCG tablet Commonly known as: SYNTHROID  Take 75 mcg by mouth daily before breakfast.   omeprazole 20 MG capsule Commonly known as: PRILOSEC Take 20 mg by mouth daily.   oxyCODONE -acetaminophen  5-325 MG tablet Commonly known as: Percocet Take 1 tablet by mouth every 6 (six) hours as needed for up to 2 days for severe pain (pain score 7-10).   potassium chloride  10 MEQ tablet Commonly known as: KLOR-CON  Take 10 mEq by mouth 2 (two) times daily.   RA Vitamin B-12 TR 1000 MCG Tbcr Generic drug: Cyanocobalamin  Take 1,000 mcg by mouth daily.   triamterene -hydrochlorothiazide  37.5-25 MG tablet Commonly known as: MAXZIDE -25 Take 2 tablets by mouth daily.        Follow-up Information     Cleotilde Oneil FALCON, MD Follow up.   Specialty: Internal Medicine Why: hospital follow up Contact information: [address] [facility] [place] [address] [phone]                No Known Allergies  Consultations: Neuro surg    Procedures/Studies: MR THORACIC SPINE WO CONTRAST Result Date: 10/14/2023 EXAM: MR Thoracic Spine without 10/13/2023 10:14:02 PM TECHNIQUE: Multiplanar multisequence MRI of the thoracic spine was performed without the administration of intravenous contrast. COMPARISON: MRI of the lumbar spine dated 10/12/2023. CLINICAL HISTORY: Back trauma, abnormal neuro exam, CT or xray positive (Age >= 16y). Difficulty walking and sciatica. Pain in lower back in the midline. Magnetic gait. FINDINGS: BONES AND ALIGNMENT: Normal alignment. An acute mild compression fracture of T12 was again demonstrated. The vertebral body has lost approximately 10% of its height anteriorly and there are edematous changes present within the superior aspect of the vertebral body. The posterior wall is intact. The other thoracic vertebrae maintained their height. SPINAL CORD: Normal spinal cord volume. Normal spinal cord signal.  SOFT TISSUES: Unremarkable. DEGENERATIVE CHANGES: Mild disc space narrowing at multiple levels throughout the thoracic spine. Minimal disc bulging at T2-3 and T7-8. No significant spinal canal or neural foraminal stenosis. IMPRESSION: 1. Acute mild compression fracture of T12 with approximately 10% anterior height loss and edematous changes. The posterior wall is intact. 2. Minimal disc bulging at T2-3 and T7-8 without significant spinal canal or neural foraminal stenosis. Electronically signed by: Evalene Coho MD 10/14/2023 04:11 AM EDT RP Workstation: [id number]   MR LUMBAR SPINE WO CONTRAST Result Date: 10/12/2023 EXAM: MRI LUMBAR SPINE 10/12/2023 07:47:08 PM TECHNIQUE: Multiplanar multisequence MRI of the lumbar spine was performed without the administration of intravenous contrast. COMPARISON: 12/07/2022 CLINICAL HISTORY: Lower back pain, bilateral leg aching. Patient reports multiple falls recently and has been unable to walk in the past week, previously able to use a walker. History of back problems and Parkinson's disease. Complains of right knee pain and ongoing sciatica on that side. FINDINGS: BONES AND ALIGNMENT: Wedge compression fracture of T12 with less than 25% height loss and moderate bone marrow edema, likely acute. According to the AO Spine classification of thoracolumbar injuries, the finding is consistent with a T12:  A1 (wedge compression fracture with less than 25% height loss, no posterior wall involvement). Grade 1 anterolisthesis at L4-5 and grade 1 retrolisthesis at L5-S1. SPINAL CORD: The conus terminates normally. SOFT TISSUES: No paraspinal mass. L1-L2: No significant disc herniation. No spinal canal stenosis or neural foraminal narrowing. L2-L3: Small left asymmetric disc bulge. No central spinal canal or neural foraminal stenosis. L3-L4: Posterior decompression. Small subarticular disc protrusion. No spinal canal stenosis or neural foraminal narrowing. L4-L5: Moderate facet  arthrosis and mild spinal canal stenosis. No neural impingement. L5-S1: Small central disc protrusion. No central spinal canal or neural foraminal stenosis. VASCULATURE: Unchanged size of infrarenal abdominal aortic aneurysm measuring 3.2 cm with eccentric mural thrombus. According to the ACR 2013 and SVS 2018 guidelines, the finding is consistent with a small abdominal aortic aneurysm (3.2 cm), and the recommendation is surveillance imaging at a 3-year interval. IMPRESSION: 1. Acute wedge compression fracture of T12 with less than 25% height loss and moderate bone marrow edema. Conservative treatment recommended. (AO Spine T12: A1) 2. Grade 1 anterolisthesis at L4-5 and grade 1 retrolisthesis at L5-S1. 3. Mild spinal canal stenosis at L4-5 without neural impingement. 4. Unchanged abdominal aortic aneurysm (3.2 cm) with eccentric mural thrombus. Surveillance imaging at a 3-year interval recommended. Electronically signed by: Franky Stanford MD 10/12/2023 09:18 PM EDT RP Workstation: [id number]   DG Tibia/Fibula Left Result Date: 10/12/2023 CLINICAL DATA:  Status post fall a week ago with ongoing pain. EXAM: LEFT TIBIA AND FIBULA - 2 VIEW COMPARISON:  None Available. FINDINGS: There is no evidence of acute or healing fracture. Cortical margins are intact. No erosions or periostitis. No ankle or knee dislocation. No focal soft tissue abnormalities. IMPRESSION: Negative radiographs of the left tibia and fibula. Electronically Signed   By: Andrea Gasman M.D.   On: 10/12/2023 16:28   (Echo, Carotid, EGD, Colonoscopy, ERCP)    Subjective: Pt c/o back pain    Discharge Exam: Vitals:   10/14/23 0425 10/14/23 0736  BP: (!) 125/91 125/89  Pulse: 95 82  Resp: 20 18  Temp: 98.5 F (36.9 C) (!) 97.4 F (36.3 C)  SpO2: 94% 96%   Vitals:   10/13/23 1549 10/13/23 2021 10/14/23 0425 10/14/23 0736  BP: 112/77 112/74 (!) 125/91 125/89  Pulse: 97 89 95 82  Resp: 14 16 20 18   Temp: 99 F (37.2 C) 97.6 F (36.4  C) 98.5 F (36.9 C) (!) 97.4 F (36.3 C)  TempSrc:      SpO2: 96% 95% 94% 96%  Weight:      Height:        General: Pt is alert, awake, not in acute distress Cardiovascular: S1/S2 +, no rubs, no gallops Respiratory: CTA bilaterally, no wheezing, no rhonchi Abdominal: Soft, NT, ND, bowel sounds + Extremities:  no cyanosis    The results of significant diagnostics from this hospitalization (including imaging, microbiology, ancillary and laboratory) are listed below for reference.     Microbiology: No results found for this or any previous visit (from the past 240 hours).   Labs: BNP (last 3 results) No results for input(s): BNP in the last 8760 hours. Basic Metabolic Panel: Recent Labs  Lab 10/12/23 1434 10/13/23 0443 10/14/23 0449  NA 144 141 140  K 3.9 3.8 3.4*  CL 105 104 102  CO2 28 27 27   GLUCOSE 103* 119* 117*  BUN 18 19 14   CREATININE 0.79 0.88 0.84  CALCIUM  9.7 9.5 9.7   Liver Function Tests: Recent Labs  Lab  10/12/23 1434  AST 22  ALT 6  ALKPHOS 68  BILITOT 0.5  PROT 6.8  ALBUMIN 3.9   No results for input(s): LIPASE, AMYLASE in the last 168 hours. No results for input(s): AMMONIA in the last 168 hours. CBC: Recent Labs  Lab 10/12/23 1434 10/13/23 0443  WBC 7.2 8.5  HGB 14.9 14.2  HCT 47.4* 46.8*  MCV 99.0 103.5*  PLT 267 245   Cardiac Enzymes: No results for input(s): CKTOTAL, CKMB, CKMBINDEX, TROPONINI in the last 168 hours. BNP: Invalid input(s): POCBNP CBG: No results for input(s): GLUCAP in the last 168 hours. D-Dimer No results for input(s): DDIMER in the last 72 hours. Hgb A1c No results for input(s): HGBA1C in the last 72 hours. Lipid Profile No results for input(s): CHOL, HDL, LDLCALC, TRIG, CHOLHDL, LDLDIRECT in the last 72 hours. Thyroid  function studies No results for input(s): TSH, T4TOTAL, T3FREE, THYROIDAB in the last 72 hours.  Invalid input(s): FREET3 Anemia work  up No results for input(s): VITAMINB12, FOLATE, FERRITIN, TIBC, IRON, RETICCTPCT in the last 72 hours. Urinalysis    Component Value Date/Time   COLORURINE AMBER (A) 07/06/2020 2223   APPEARANCEUR CLOUDY (A) 07/06/2020 2223   LABSPEC 1.017 07/06/2020 2223   PHURINE 6.0 07/06/2020 2223   GLUCOSEU NEGATIVE 07/06/2020 2223   HGBUR NEGATIVE 07/06/2020 2223   BILIRUBINUR NEGATIVE 07/06/2020 2223   KETONESUR 5 (A) 07/06/2020 2223   PROTEINUR NEGATIVE 07/06/2020 2223   NITRITE NEGATIVE 07/06/2020 2223   LEUKOCYTESUR LARGE (A) 07/06/2020 2223   Sepsis Labs Recent Labs  Lab 10/12/23 1434 10/13/23 0443  WBC 7.2 8.5   Microbiology No results found for this or any previous visit (from the past 240 hours).   Time coordinating discharge: 35 minutes  SIGNED:   Anthony CHRISTELLA Pouch, MD  [facility] 10/14/2023, 12:40 PM Pager   If 7PM-7AM, please contact night-coverage [email or web address]

## 2023-10-14 NOTE — Telephone Encounter (Signed)
 Per secure chat from Dr. Clois- can you set her up for follow up with [facility] in 4 weeks or so for the fracture?   Patient still admitted; will contact once discharged.

## 2023-10-14 NOTE — Telephone Encounter (Signed)
 Patient scheduled- subject to change due to facility

## 2023-10-15 ENCOUNTER — Encounter: Payer: Self-pay | Admitting: Adult Health

## 2023-10-15 ENCOUNTER — Non-Acute Institutional Stay (SKILLED_NURSING_FACILITY): Payer: Self-pay | Admitting: Adult Health

## 2023-10-15 DIAGNOSIS — K5901 Slow transit constipation: Secondary | ICD-10-CM

## 2023-10-15 DIAGNOSIS — K219 Gastro-esophageal reflux disease without esophagitis: Secondary | ICD-10-CM

## 2023-10-15 DIAGNOSIS — F419 Anxiety disorder, unspecified: Secondary | ICD-10-CM

## 2023-10-15 DIAGNOSIS — G20A1 Parkinson's disease without dyskinesia, without mention of fluctuations: Secondary | ICD-10-CM | POA: Diagnosis not present

## 2023-10-15 DIAGNOSIS — E039 Hypothyroidism, unspecified: Secondary | ICD-10-CM

## 2023-10-15 DIAGNOSIS — F32A Depression, unspecified: Secondary | ICD-10-CM

## 2023-10-15 DIAGNOSIS — S22080S Wedge compression fracture of T11-T12 vertebra, sequela: Secondary | ICD-10-CM

## 2023-10-15 NOTE — Progress Notes (Signed)
 Location:  Other [facility]) Nursing Home Room Number: [place] [facility]) Place of Service:  SNF (31) Provider:  Medina-Vargas, Zyree Traynham, DNP, FNP-BC  Patient Care Team: Cleotilde Oneil FALCON, MD as PCP - General (Internal Medicine)  Extended Emergency Contact Information Primary Emergency Contact: [facility] Address: [address]          [place], [place] [address] [place] Home Phone: [phone] Mobile Phone: [phone] Relation: Spouse  Code Status:  DNR  Goals of care: Advanced Directive information    10/15/2023   10:12 AM  Advanced Directives  Does Patient Have a Medical Advance Directive? No  Does patient want to make changes to medical advance directive? No - Patient declined     Chief Complaint  Patient presents with   New Admit To SNF    New admission    HPI:  Pt is a 73 y.o. female seen today for hospital follow up. She was admitted to [facility] on 10/14/23. She was hospitalized 10/12/23 to 10/14/23 for T12 compression fracture.  She presented to the emergency room with acute onset of recurrent falls over the last month with a recent fall and subsequent worsening low back pain.  MRI without contrast revealed acute wedge compression fracture of T12 with less than 25% height loss and moderate bone marrow edema.  He had a TLSO brace placed upon consultation with neurosurgeon, Dr. Clois.  She was seen in the room today and complained of lower back pain, 5/10 pain. She will have PT and OT evaluation and treat as needed.  She complained of constipation.   Past Medical History:  Diagnosis Date   Breast cancer (HCC) 11/2014   left   Cancer (HCC) 2016   left breast   Depression    Dysrhythmia    heart racing   Family history of adverse reaction to anesthesia    brother had difficulty going under anesthseia   Headache    hx of migraines   period induced   Hypertension    Hypothyroidism    Memory changes    Osteoarthritis     Parkinson disease (HCC)    Personal history of radiation therapy    Past Surgical History:  Procedure Laterality Date   BREAST BIOPSY Left 10/2014   positive   BREAST LUMPECTOMY Left 2016   invasive mammary   CATARACT EXTRACTION W/PHACO Right 11/27/2021   Procedure: CATARACT EXTRACTION PHACO AND INTRAOCULAR LENS PLACEMENT (IOC) RIGHT 6.56 00:47.9;  Surgeon: Enola Feliciano Hugger, MD;  Location: [facility];  Service: Ophthalmology;  Laterality: Right;   CATARACT EXTRACTION W/PHACO Left 12/11/2021   Procedure: CATARACT EXTRACTION PHACO AND INTRAOCULAR LENS PLACEMENT (IOC) LEFT 3.47 00:29.2;  Surgeon: Enola Feliciano Hugger, MD;  Location: [facility];  Service: Ophthalmology;  Laterality: Left;   COLONOSCOPY Left 07/17/2018   Procedure: COLONOSCOPY;  Surgeon: Janalyn Keene NOVAK, MD;  Location: [facility] ENDOSCOPY;  Service: Endoscopy;  Laterality: Left;   DILATION AND CURETTAGE OF UTERUS     ESOPHAGOGASTRODUODENOSCOPY Left 07/17/2018   Procedure: ESOPHAGOGASTRODUODENOSCOPY (EGD);  Surgeon: Janalyn Keene NOVAK, MD;  Location: [facility] ENDOSCOPY;  Service: Endoscopy;  Laterality: Left;   Laminectomy Posterior Cervicle Decomp W/Facetectomy & Foraminotomy     [id number] x 3...SABRASABRAEXCISION CENTRAL LEFT DISC HERNIATION L 2-3; Surgeon: Debby Lorrene Kaufmann, MD; Location: [facility] OR; Service: Orthopedics; Laterality: Left   Laminectomy Posterior Lumbar Facetectomy & Foraminotomy W/Decomp     BILATERAL LUMBAR DECOMPRESSION L2-3, L3-4, L4-5; Surgeon: Debby Lorrene Kaufmann, MD; Location:  [facility] OR; Service: Orthopedics; Laterality: Bilateral;   PARTIAL MASTECTOMY WITH NEEDLE LOCALIZATION Left 11/29/2014   Procedure: PARTIAL MASTECTOMY WITH NEEDLE LOCALIZATION;  Surgeon: Larinda Unknown Sharps, MD;  Location: [facility] ORS;  Service: General;  Laterality: Left;   SENTINEL NODE BIOPSY Left 11/29/2014   Procedure: SENTINEL NODE BIOPSY;  Surgeon: Larinda Unknown Sharps, MD;  Location: [facility] ORS;  Service: General;  Laterality:  Left;    No Known Allergies  Outpatient Encounter Medications as of 10/15/2023  Medication Sig   acetaminophen  (TYLENOL ) 325 MG tablet Take 650 mg by mouth in the morning and at bedtime.   ALPRAZolam  (XANAX ) 0.5 MG tablet Take 1 tablet (0.5 mg total) by mouth at bedtime as needed for up to 2 days for sleep.   carbidopa -levodopa  (SINEMET  IR) 25-100 MG tablet Take 2 tablets by mouth 4 (four) times daily.   citalopram  (CELEXA ) 20 MG tablet Take 30 mg by mouth daily.   Cyanocobalamin  (RA VITAMIN B-12 TR) 1000 MCG TBCR Take 1,000 mcg by mouth daily.    fexofenadine (ALLEGRA) 180 MG tablet Take 180 mg by mouth daily.   levothyroxine  (SYNTHROID ) 75 MCG tablet Take 75 mcg by mouth daily before breakfast.   omeprazole (PRILOSEC) 20 MG capsule Take 20 mg by mouth daily.   oxyCODONE -acetaminophen  (PERCOCET) 5-325 MG tablet Take 1 tablet by mouth every 6 (six) hours as needed for up to 2 days for severe pain (pain score 7-10) or moderate pain (pain score 4-6).   potassium chloride  (K-DUR) 10 MEQ tablet Take 10 mEq by mouth 2 (two) times daily.    triamterene -hydrochlorothiazide  (MAXZIDE -25) 37.5-25 MG tablet Take 2 tablets by mouth daily.    No facility-administered encounter medications on file as of 10/15/2023.    Review of Systems  Constitutional:  Negative for appetite change, chills, fatigue and fever.  HENT:  Negative for congestion, hearing loss, rhinorrhea and sore throat.   Eyes: Negative.   Respiratory:  Negative for cough, shortness of breath and wheezing.   Cardiovascular:  Negative for chest pain, palpitations and leg swelling.  Gastrointestinal:  Positive for constipation. Negative for abdominal pain, diarrhea, nausea and vomiting.  Genitourinary:  Negative for dysuria.  Musculoskeletal:  Negative for arthralgias, back pain and myalgias.  Skin:  Negative for color change, rash and wound.  Neurological:  Negative for dizziness, weakness and headaches.  Psychiatric/Behavioral:  Negative  for behavioral problems. The patient is not nervous/anxious.     Immunization History  Administered Date(s) Administered   Influenza Inj Mdck Quad Pf 11/08/2018, 11/20/2020, 11/25/2021   Influenza,inj,Quad PF,6+ Mos 11/14/2019   PFIZER(Purple Top)SARS-COV-2 Vaccination 04/01/2019, 04/25/2019   Pneumococcal Conjugate-13 03/06/2017   Zoster Recombinant(Shingrix) 02/27/2017   Pertinent  Health Maintenance Due  Topic Date Due   Influenza Vaccine  09/10/2023   MAMMOGRAM  04/22/2025   Colonoscopy  07/16/2028   DEXA SCAN  Completed      03/02/2019    9:45 AM 03/03/2019    2:25 PM 07/06/2020    9:17 PM 11/27/2021   12:56 PM 12/11/2021   12:32 PM  Fall Risk  (RETIRED) Patient Fall Risk Level High fall risk  High fall risk  Moderate fall risk  Moderate fall risk  Low fall risk      Data saved with a previous flowsheet row definition     Vitals:   10/15/23 1002  BP: (!) 128/93  Pulse: 79  Resp: 18  Temp: (!) 97.3 F (36.3 C)  SpO2: 92%  Weight: 184 lb 8 oz (83.7  kg)  Height: 5' 7 (1.702 m)   Body mass index is 28.9 kg/m.  Physical Exam Constitutional:      Appearance: Normal appearance.  HENT:     Head: Normocephalic and atraumatic.     Nose: Nose normal.     Mouth/Throat:     Mouth: Mucous membranes are moist.  Eyes:     Conjunctiva/sclera: Conjunctivae normal.  Cardiovascular:     Rate and Rhythm: Normal rate and regular rhythm.  Pulmonary:     Effort: Pulmonary effort is normal.     Breath sounds: Normal breath sounds.  Abdominal:     General: Bowel sounds are normal.     Palpations: Abdomen is soft.  Musculoskeletal:        General: Normal range of motion.     Cervical back: Normal range of motion.  Skin:    General: Skin is warm and dry.  Neurological:     General: No focal deficit present.     Mental Status: She is alert and oriented to person, place, and time.  Psychiatric:        Mood and Affect: Mood normal.        Behavior: Behavior normal.         Thought Content: Thought content normal.        Judgment: Judgment normal.      Labs reviewed: Recent Labs    10/12/23 1434 10/13/23 0443 10/14/23 0449  NA 144 141 140  K 3.9 3.8 3.4*  CL 105 104 102  CO2 28 27 27   GLUCOSE 103* 119* 117*  BUN 18 19 14   CREATININE 0.79 0.88 0.84  CALCIUM  9.7 9.5 9.7   Recent Labs    10/12/23 1434  AST 22  ALT 6  ALKPHOS 68  BILITOT 0.5  PROT 6.8  ALBUMIN 3.9   Recent Labs    10/12/23 1434 10/13/23 0443  WBC 7.2 8.5  HGB 14.9 14.2  HCT 47.4* 46.8*  MCV 99.0 103.5*  PLT 267 245   Lab Results  Component Value Date   TSH 2.920 08/09/2018   No results found for: HGBA1C No results found for: CHOL, HDL, LDLCALC, LDLDIRECT, TRIG, CHOLHDL  Significant Diagnostic Results in last 30 days:  MR THORACIC SPINE WO CONTRAST Result Date: 10/14/2023 EXAM: MR Thoracic Spine without 10/13/2023 10:14:02 PM TECHNIQUE: Multiplanar multisequence MRI of the thoracic spine was performed without the administration of intravenous contrast. COMPARISON: MRI of the lumbar spine dated 10/12/2023. CLINICAL HISTORY: Back trauma, abnormal neuro exam, CT or xray positive (Age >= 16y). Difficulty walking and sciatica. Pain in lower back in the midline. Magnetic gait. FINDINGS: BONES AND ALIGNMENT: Normal alignment. An acute mild compression fracture of T12 was again demonstrated. The vertebral body has lost approximately 10% of its height anteriorly and there are edematous changes present within the superior aspect of the vertebral body. The posterior wall is intact. The other thoracic vertebrae maintained their height. SPINAL CORD: Normal spinal cord volume. Normal spinal cord signal. SOFT TISSUES: Unremarkable. DEGENERATIVE CHANGES: Mild disc space narrowing at multiple levels throughout the thoracic spine. Minimal disc bulging at T2-3 and T7-8. No significant spinal canal or neural foraminal stenosis. IMPRESSION: 1. Acute mild compression fracture of T12  with approximately 10% anterior height loss and edematous changes. The posterior wall is intact. 2. Minimal disc bulging at T2-3 and T7-8 without significant spinal canal or neural foraminal stenosis. Electronically signed by: Evalene Coho MD 10/14/2023 04:11 AM EDT RP Workstation: [id number]   MR  LUMBAR SPINE WO CONTRAST Result Date: 10/12/2023 EXAM: MRI LUMBAR SPINE 10/12/2023 07:47:08 PM TECHNIQUE: Multiplanar multisequence MRI of the lumbar spine was performed without the administration of intravenous contrast. COMPARISON: 12/07/2022 CLINICAL HISTORY: Lower back pain, bilateral leg aching. Patient reports multiple falls recently and has been unable to walk in the past week, previously able to use a walker. History of back problems and Parkinson's disease. Complains of right knee pain and ongoing sciatica on that side. FINDINGS: BONES AND ALIGNMENT: Wedge compression fracture of T12 with less than 25% height loss and moderate bone marrow edema, likely acute. According to the AO Spine classification of thoracolumbar injuries, the finding is consistent with a T12: A1 (wedge compression fracture with less than 25% height loss, no posterior wall involvement). Grade 1 anterolisthesis at L4-5 and grade 1 retrolisthesis at L5-S1. SPINAL CORD: The conus terminates normally. SOFT TISSUES: No paraspinal mass. L1-L2: No significant disc herniation. No spinal canal stenosis or neural foraminal narrowing. L2-L3: Small left asymmetric disc bulge. No central spinal canal or neural foraminal stenosis. L3-L4: Posterior decompression. Small subarticular disc protrusion. No spinal canal stenosis or neural foraminal narrowing. L4-L5: Moderate facet arthrosis and mild spinal canal stenosis. No neural impingement. L5-S1: Small central disc protrusion. No central spinal canal or neural foraminal stenosis. VASCULATURE: Unchanged size of infrarenal abdominal aortic aneurysm measuring 3.2 cm with eccentric mural thrombus. According  to the ACR 2013 and SVS 2018 guidelines, the finding is consistent with a small abdominal aortic aneurysm (3.2 cm), and the recommendation is surveillance imaging at a 3-year interval. IMPRESSION: 1. Acute wedge compression fracture of T12 with less than 25% height loss and moderate bone marrow edema. Conservative treatment recommended. (AO Spine T12: A1) 2. Grade 1 anterolisthesis at L4-5 and grade 1 retrolisthesis at L5-S1. 3. Mild spinal canal stenosis at L4-5 without neural impingement. 4. Unchanged abdominal aortic aneurysm (3.2 cm) with eccentric mural thrombus. Surveillance imaging at a 3-year interval recommended. Electronically signed by: Franky Stanford MD 10/12/2023 09:18 PM EDT RP Workstation: [id number]   DG Tibia/Fibula Left Result Date: 10/12/2023 CLINICAL DATA:  Status post fall a week ago with ongoing pain. EXAM: LEFT TIBIA AND FIBULA - 2 VIEW COMPARISON:  None Available. FINDINGS: There is no evidence of acute or healing fracture. Cortical margins are intact. No erosions or periostitis. No ankle or knee dislocation. No focal soft tissue abnormalities. IMPRESSION: Negative radiographs of the left tibia and fibula. Electronically Signed   By: Andrea Gasman M.D.   On: 10/12/2023 16:28    Assessment/Plan  1. Compression fracture of T12 vertebra, sequela (Primary) -Lumbar MRI without contrast revealed compression fracture of T12 with less than 25% height loss Will follow-up with neurosurgeon-, Dr. Clois -For PT and OT, for therapeutic strengthening exercises -Fall precautions    -   Continue acetaminophen  625 mg 1 tab every morning and bedtime -   Continue TLSO brace  2. Parkinson's disease, unspecified whether dyskinesia present, unspecified whether manifestations fluctuate (HCC) -   Continue carbidopa /levodopa  25-100 mg 2 tabs every 4 hours  3. Hypothyroidism, unspecified type Lab Results  Component Value Date   TSH 2.920 08/09/2018    - Continue levothyroxine  75 mcg 1 tab  daily    4. GERD without esophagitis -   Continue omeprazole 20 mg 1 capsule daily  5.  Slow transit constipation - Will start on MiraLAX  17 g p.o. daily and MiraLAX  17 g p.o. daily as needed  6.  Anxiety and depression -   Continue citalopram  30  mg daily 10 Xanax  0.5 mg at bedtime as needed   Family/ staff Communication: Discussed plan of care with resident and charge nurse.  Labs/tests ordered:  None    Gaetano Romberger Medina-Vargas, DNP, MSN, FNP-BC [facility] [phone] (Monday-Friday 8:00 a.m. - 5:00 p.m.) [phone] (after hours)

## 2023-10-19 ENCOUNTER — Other Ambulatory Visit: Payer: Self-pay | Admitting: Nurse Practitioner

## 2023-10-19 MED ORDER — ALPRAZOLAM 0.5 MG PO TABS: 0.5000 mg | ORAL_TABLET | Freq: Every evening | ORAL | 0 refills | Status: DC | PRN

## 2023-10-22 ENCOUNTER — Non-Acute Institutional Stay (SKILLED_NURSING_FACILITY): Payer: Self-pay | Admitting: Student

## 2023-10-22 DIAGNOSIS — S22080S Wedge compression fracture of T11-T12 vertebra, sequela: Secondary | ICD-10-CM

## 2023-10-22 DIAGNOSIS — I10 Essential (primary) hypertension: Secondary | ICD-10-CM | POA: Diagnosis not present

## 2023-10-22 DIAGNOSIS — G20A1 Parkinson's disease without dyskinesia, without mention of fluctuations: Secondary | ICD-10-CM | POA: Diagnosis not present

## 2023-10-22 DIAGNOSIS — F330 Major depressive disorder, recurrent, mild: Secondary | ICD-10-CM

## 2023-10-22 DIAGNOSIS — E039 Hypothyroidism, unspecified: Secondary | ICD-10-CM

## 2023-10-24 ENCOUNTER — Encounter: Payer: Self-pay | Admitting: Student

## 2023-10-24 NOTE — Progress Notes (Signed)
 Provider:   Location:  Other Nursing Home Room Number: [place] [address] A Place of Service:  SNF (31)  PCP: Cleotilde Oneil FALCON, MD Patient Care Team: Cleotilde Oneil FALCON, MD as PCP - General (Internal Medicine)  Extended Emergency Contact Information Primary Emergency Contact: [facility] Address: [address]          [place], [place] [address] [place] Home Phone: [phone] Mobile Phone: [phone] Relation: Spouse  Code Status: DNR Goals of Care: Advanced Directive information    10/15/2023   10:12 AM  Advanced Directives  Does Patient Have a Medical Advance Directive? No;Yes  Type of Advance Directive Out of facility DNR (pink MOST or yellow form)  Does patient want to make changes to medical advance directive? No - Patient declined      Chief Complaint  Patient presents with   Medical Management of Chronic Issues    Admission     HPI: Patient is a 73 y.o. female seen today for admission to [facility].  Discussed the use of AI scribe software for clinical note transcription with the patient, who gave verbal consent to proceed.  History of Present Illness   History of Present Illness The patient is a 73 year old with Parkinson's disease and osteoarthritis who presents with recurrent falls and worsening lower back pain.  She has been experiencing recurrent falls over the past four weeks, attributed to neuropathy in her feet and weakness in her knees. Her feet sometimes 'started sticking,' leading to falls. Previously, she was able to use a walker but is now unable to ambulate due to weakness in both lower extremities. No saddle anesthesia is reported.  She has a history of chronic back pain and sciatica, with recent worsening of lower back pain following a fall. Imaging revealed an acute wedge compression fracture at T12 with 20% height loss, grade one anterolisthesis of L4 and L5, and grade one retrolisthesis of L5 S1, as well as mild spinal canal stenosis at  L4 and L5. She is currently using a TLSO brace and undergoing physical and occupational therapy.  Her medication regimen includes amlodipine  for hypertension, levothyroxine  for hypothyroidism, Celexa  for depression, and Sinemet  for Parkinson's disease. She takes 0.5 mg of Xanax  nightly as needed for sleep, having switched from temazepam  in October 2024. She recently increased her Xanax  dose to a whole pill at night due to sleep difficulties.  She denies current anxiety and reports a stable mood, stating 'I haven't been anxious' and 'this is me' regarding her mood. She relies on her husband for medication management, noting 'I lean on my husband' for this task.  She has a history of osteoarthritis and Parkinson's disease, which contribute to her mobility challenges. She mentions needing strength in her legs and is working on walking with assistance during therapy sessions.  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, broadcasting/film/video Status: Married - Living Situation: Lives with husband   Family History - Mother: memory issues   Results RADIOLOGY Left hip X-ray: Negative Lumbar spine MRI: Acute wedge compression fracture T12 with 20% height loss, grade 1 anterolisthesis of L4-L5, grade 1 retrolisthesis of L5-S1, mild spinal canal stenosis at L4-L5 without neural impingement Abdominal aortic aneurysm surveillance: 3.2 cm with eccentric mural thrombus  Assessment and Plan Past Medical History:  Diagnosis Date   Breast cancer (HCC) 11/2014   left   Cancer (HCC) 2016   left breast   Depression    Dysrhythmia  heart racing   Family history of adverse reaction to anesthesia    brother had difficulty going under anesthseia   Headache    hx of migraines   period induced   Hypertension    Hypothyroidism    Memory changes    Osteoarthritis    Parkinson disease (HCC)    Personal history of radiation therapy    Past Surgical History:  Procedure Laterality Date    BREAST BIOPSY Left 10/2014   positive   BREAST LUMPECTOMY Left 2016   invasive mammary   CATARACT EXTRACTION W/PHACO Right 11/27/2021   Procedure: CATARACT EXTRACTION PHACO AND INTRAOCULAR LENS PLACEMENT (IOC) RIGHT 6.56 00:47.9;  Surgeon: Enola Feliciano Hugger, MD;  Location: [facility];  Service: Ophthalmology;  Laterality: Right;   CATARACT EXTRACTION W/PHACO Left 12/11/2021   Procedure: CATARACT EXTRACTION PHACO AND INTRAOCULAR LENS PLACEMENT (IOC) LEFT 3.47 00:29.2;  Surgeon: Enola Feliciano Hugger, MD;  Location: [facility];  Service: Ophthalmology;  Laterality: Left;   COLONOSCOPY Left 07/17/2018   Procedure: COLONOSCOPY;  Surgeon: Janalyn Keene NOVAK, MD;  Location: [facility] ENDOSCOPY;  Service: Endoscopy;  Laterality: Left;   DILATION AND CURETTAGE OF UTERUS     ESOPHAGOGASTRODUODENOSCOPY Left 07/17/2018   Procedure: ESOPHAGOGASTRODUODENOSCOPY (EGD);  Surgeon: Janalyn Keene NOVAK, MD;  Location: [facility] ENDOSCOPY;  Service: Endoscopy;  Laterality: Left;   Laminectomy Posterior Cervicle Decomp W/Facetectomy & Foraminotomy     [id number] x 3...SABRASABRAEXCISION CENTRAL LEFT DISC HERNIATION L 2-3; Surgeon: Debby Lorrene Kaufmann, MD; Location: [facility] OR; Service: Orthopedics; Laterality: Left   Laminectomy Posterior Lumbar Facetectomy & Foraminotomy W/Decomp     BILATERAL LUMBAR DECOMPRESSION L2-3, L3-4, L4-5; Surgeon: Debby Lorrene Kaufmann, MD; Location: [facility] OR; Service: Orthopedics; Laterality: Bilateral;   PARTIAL MASTECTOMY WITH NEEDLE LOCALIZATION Left 11/29/2014   Procedure: PARTIAL MASTECTOMY WITH NEEDLE LOCALIZATION;  Surgeon: Larinda Unknown Sharps, MD;  Location: [facility] ORS;  Service: General;  Laterality: Left;   SENTINEL NODE BIOPSY Left 11/29/2014   Procedure: SENTINEL NODE BIOPSY;  Surgeon: Larinda Unknown Sharps, MD;  Location: [facility] ORS;  Service: General;  Laterality: Left;    reports that she quit smoking about 23 years ago. Her smoking use included cigarettes. She has never used smokeless  tobacco. She reports that she does not currently use alcohol. She reports that she does not use drugs. Social History   Socioeconomic History   Marital status: Married    Spouse name: Not on file   Number of children: Not on file   Years of education: Not on file   Highest education level: Not on file  Occupational History   Not on file  Tobacco Use   Smoking status: Former    Current packs/day: 0.00    Types: Cigarettes    Quit date: 11/22/1999    Years since quitting: 23.9   Smokeless tobacco: Never  Vaping Use   Vaping status: Never Used  Substance and Sexual Activity   Alcohol use: Not Currently    Comment: Rare   Drug use: No   Sexual activity: Not on file  Other Topics Concern   Not on file  Social History Narrative   Not on file   Social Drivers of Health   Financial Resource Strain: Low Risk  (01/27/2023)   Received from [facility]   Overall Financial Resource Strain (CARDIA)    Difficulty of Paying Living Expenses: Not hard at all  Food Insecurity: No Food Insecurity (10/13/2023)   Hunger Vital Sign    Worried About Running  Out of Food in the Last Year: Never true    Ran Out of Food in the Last Year: Never true  Transportation Needs: No Transportation Needs (10/13/2023)   PRAPARE - Administrator, Civil Service (Medical): No    Lack of Transportation (Non-Medical): No  Physical Activity: Not on file  Stress: Not on file  Social Connections: Socially Isolated (10/13/2023)   Social Connection and Isolation Panel    Frequency of Communication with Friends and Family: Once a week    Frequency of Social Gatherings with Friends and Family: Once a week    Attends Religious Services: Never    Database administrator or Organizations: No    Attends Banker Meetings: Never    Marital Status: Married  Catering manager Violence: Not At Risk (10/13/2023)   Humiliation, Afraid, Rape, and Kick questionnaire    Fear of Current or  Ex-Partner: No    Emotionally Abused: No    Physically Abused: No    Sexually Abused: No    Functional Status Survey:    Family History  Problem Relation Age of Onset   Breast cancer Maternal Aunt    Breast cancer Paternal Aunt    Coronary artery disease Mother    Heart attack Mother    Coronary artery disease Paternal Grandmother     Health Maintenance  Topic Date Due   Hepatitis C Screening  Never done   DTaP/Tdap/Td (1 - Tdap) Never done   Zoster Vaccines- Shingrix (2 of 2) 04/24/2017   Pneumococcal Vaccine: 50+ Years (2 of 2 - PPSV23, PCV20, or PCV21) 05/01/2017   Medicare Annual Wellness (AWV)  11/03/2018   COVID-19 Vaccine (3 - Pfizer risk series) 05/23/2019   Influenza Vaccine  09/10/2023   Colonoscopy  07/16/2028   DEXA SCAN  Completed   HPV VACCINES  Aged Out   Meningococcal B Vaccine  Aged Out   Mammogram  Discontinued    No Known Allergies  Outpatient Encounter Medications as of 10/22/2023  Medication Sig   acetaminophen  (TYLENOL ) 325 MG tablet Take 650 mg by mouth in the morning and at bedtime.   ALPRAZolam  (XANAX ) 0.5 MG tablet Take 1 tablet (0.5 mg total) by mouth at bedtime as needed for anxiety.   carbidopa -levodopa  (SINEMET  IR) 25-100 MG tablet Take 2 tablets by mouth 4 (four) times daily.   citalopram  (CELEXA ) 20 MG tablet Take 30 mg by mouth daily.   Cyanocobalamin  (RA VITAMIN B-12 TR) 1000 MCG TBCR Take 1,000 mcg by mouth daily.    fexofenadine (ALLEGRA) 180 MG tablet Take 180 mg by mouth daily.   levothyroxine  (SYNTHROID ) 75 MCG tablet Take 75 mcg by mouth daily before breakfast.   omeprazole (PRILOSEC) 20 MG capsule Take 20 mg by mouth daily.   potassium chloride  (K-DUR) 10 MEQ tablet Take 10 mEq by mouth 2 (two) times daily.    triamterene -hydrochlorothiazide  (MAXZIDE -25) 37.5-25 MG tablet Take 2 tablets by mouth daily.    No facility-administered encounter medications on file as of 10/22/2023.    Review of Systems  Vitals:   10/22/23 2256   BP: 96/76  Pulse: (!) 102  Resp: 20  Temp: 97.9 F (36.6 C)  SpO2: 95%  Weight: 183 lb 9.6 oz (83.3 kg)   Body mass index is 28.76 kg/m. Physical Exam Constitutional:      Appearance: Normal appearance.  Cardiovascular:     Rate and Rhythm: Normal rate and regular rhythm.     Pulses: Normal pulses.  Heart sounds: Normal heart sounds.  Pulmonary:     Effort: Pulmonary effort is normal.  Abdominal:     General: Abdomen is flat. Bowel sounds are normal.     Palpations: Abdomen is soft.  Musculoskeletal:        General: No swelling or tenderness.  Skin:    General: Skin is warm and dry.  Neurological:     Mental Status: She is alert.     Gait: Gait normal.     Comments: Alert, forgetful   Psychiatric:        Mood and Affect: Mood normal.    Physical Exam   Labs reviewed: Basic Metabolic Panel: Recent Labs    10/12/23 1434 10/13/23 0443 10/14/23 0449  NA 144 141 140  K 3.9 3.8 3.4*  CL 105 104 102  CO2 28 27 27   GLUCOSE 103* 119* 117*  BUN 18 19 14   CREATININE 0.79 0.88 0.84  CALCIUM  9.7 9.5 9.7   Liver Function Tests: Recent Labs    10/12/23 1434  AST 22  ALT 6  ALKPHOS 68  BILITOT 0.5  PROT 6.8  ALBUMIN 3.9   No results for input(s): LIPASE, AMYLASE in the last 8760 hours. No results for input(s): AMMONIA in the last 8760 hours. CBC: Recent Labs    10/12/23 1434 10/13/23 0443  WBC 7.2 8.5  HGB 14.9 14.2  HCT 47.4* 46.8*  MCV 99.0 103.5*  PLT 267 245   Cardiac Enzymes: No results for input(s): CKTOTAL, CKMB, CKMBINDEX, TROPONINI in the last 8760 hours. BNP: Invalid input(s): POCBNP No results found for: HGBA1C Lab Results  Component Value Date   TSH 2.920 08/09/2018   Lab Results  Component Value Date   VITAMINB12 2,192 (H) 07/17/2018   Lab Results  Component Value Date   FOLATE 10.3 07/17/2018   Lab Results  Component Value Date   IRON 60 08/09/2018   TIBC 343 08/09/2018   FERRITIN 585 (H) 08/09/2018    Results    Imaging and Procedures obtained prior to SNF admission: MR THORACIC SPINE WO CONTRAST Result Date: 10/14/2023 EXAM: MR Thoracic Spine without 10/13/2023 10:14:02 PM TECHNIQUE: Multiplanar multisequence MRI of the thoracic spine was performed without the administration of intravenous contrast. COMPARISON: MRI of the lumbar spine dated 10/12/2023. CLINICAL HISTORY: Back trauma, abnormal neuro exam, CT or xray positive (Age >= 16y). Difficulty walking and sciatica. Pain in lower back in the midline. Magnetic gait. FINDINGS: BONES AND ALIGNMENT: Normal alignment. An acute mild compression fracture of T12 was again demonstrated. The vertebral body has lost approximately 10% of its height anteriorly and there are edematous changes present within the superior aspect of the vertebral body. The posterior wall is intact. The other thoracic vertebrae maintained their height. SPINAL CORD: Normal spinal cord volume. Normal spinal cord signal. SOFT TISSUES: Unremarkable. DEGENERATIVE CHANGES: Mild disc space narrowing at multiple levels throughout the thoracic spine. Minimal disc bulging at T2-3 and T7-8. No significant spinal canal or neural foraminal stenosis. IMPRESSION: 1. Acute mild compression fracture of T12 with approximately 10% anterior height loss and edematous changes. The posterior wall is intact. 2. Minimal disc bulging at T2-3 and T7-8 without significant spinal canal or neural foraminal stenosis. Electronically signed by: Evalene Coho MD 10/14/2023 04:11 AM EDT RP Workstation: [id number]   MR LUMBAR SPINE WO CONTRAST Result Date: 10/12/2023 EXAM: MRI LUMBAR SPINE 10/12/2023 07:47:08 PM TECHNIQUE: Multiplanar multisequence MRI of the lumbar spine was performed without the administration of intravenous contrast. COMPARISON: 12/07/2022 CLINICAL  HISTORY: Lower back pain, bilateral leg aching. Patient reports multiple falls recently and has been unable to walk in the past week, previously able  to use a walker. History of back problems and Parkinson's disease. Complains of right knee pain and ongoing sciatica on that side. FINDINGS: BONES AND ALIGNMENT: Wedge compression fracture of T12 with less than 25% height loss and moderate bone marrow edema, likely acute. According to the AO Spine classification of thoracolumbar injuries, the finding is consistent with a T12: A1 (wedge compression fracture with less than 25% height loss, no posterior wall involvement). Grade 1 anterolisthesis at L4-5 and grade 1 retrolisthesis at L5-S1. SPINAL CORD: The conus terminates normally. SOFT TISSUES: No paraspinal mass. L1-L2: No significant disc herniation. No spinal canal stenosis or neural foraminal narrowing. L2-L3: Small left asymmetric disc bulge. No central spinal canal or neural foraminal stenosis. L3-L4: Posterior decompression. Small subarticular disc protrusion. No spinal canal stenosis or neural foraminal narrowing. L4-L5: Moderate facet arthrosis and mild spinal canal stenosis. No neural impingement. L5-S1: Small central disc protrusion. No central spinal canal or neural foraminal stenosis. VASCULATURE: Unchanged size of infrarenal abdominal aortic aneurysm measuring 3.2 cm with eccentric mural thrombus. According to the ACR 2013 and SVS 2018 guidelines, the finding is consistent with a small abdominal aortic aneurysm (3.2 cm), and the recommendation is surveillance imaging at a 3-year interval. IMPRESSION: 1. Acute wedge compression fracture of T12 with less than 25% height loss and moderate bone marrow edema. Conservative treatment recommended. (AO Spine T12: A1) 2. Grade 1 anterolisthesis at L4-5 and grade 1 retrolisthesis at L5-S1. 3. Mild spinal canal stenosis at L4-5 without neural impingement. 4. Unchanged abdominal aortic aneurysm (3.2 cm) with eccentric mural thrombus. Surveillance imaging at a 3-year interval recommended. Electronically signed by: Franky Stanford MD 10/12/2023 09:18 PM EDT RP  Workstation: [id number]   DG Tibia/Fibula Left Result Date: 10/12/2023 CLINICAL DATA:  Status post fall a week ago with ongoing pain. EXAM: LEFT TIBIA AND FIBULA - 2 VIEW COMPARISON:  None Available. FINDINGS: There is no evidence of acute or healing fracture. Cortical margins are intact. No erosions or periostitis. No ankle or knee dislocation. No focal soft tissue abnormalities. IMPRESSION: Negative radiographs of the left tibia and fibula. Electronically Signed   By: Andrea Gasman M.D.   On: 10/12/2023 16:28    Assessment/Plan Recurrent falls Recurrent falls over the last four weeks, likely multifactorial. Contributing factors include Parkinson's disease, potential medication side effects, and possible weakness in lower extremities. Recent increase in Xanax  dosage may have contributed to falls. - Discuss potential medication adjustments to reduce fall risk - Consider dose reduction of Xanax   Acute T12 vertebral compression fracture DOI 10/12/2023 Acute wedge compression fracture at T12 with 20% height loss. Currently managed with a TLSO brace. No current pain reported. - Continue wearing TLSO brace  Parkinson's disease Parkinson's disease managed with home dose of Sinemet . Potential contributor to falls due to movement difficulties.  Chronic back pain and sciatica Chronic back pain and sciatica, currently not causing significant pain.  Benzodiazepine (Xanax ) use for sleep with plan to taper Xanax  used for sleep, recently increased to a full pill nightly. Discussed potential dependency and impact on memory and balance. Plan to taper Xanax  and introduce trazodone  for sleep. Black box warnings for people over 65 regarding memory and balance issues. - Initiate trazodone  for sleep - Taper Xanax  gradually - Educate on potential memory and balance issues related to Xanax   Depression Depression managed with citalopram  (Celexa ). Mood reported  as stable, with no recent anxiety or depressive  episodes. - Consider dose reduction of citalopram  if mood remains well-managed    Family/ staff Communication: nursing  Labs/tests ordered: none

## 2023-10-26 ENCOUNTER — Other Ambulatory Visit: Payer: Self-pay | Admitting: Nurse Practitioner

## 2023-10-26 MED ORDER — ALPRAZOLAM 0.5 MG PO TABS: 0.5000 mg | ORAL_TABLET | Freq: Every evening | ORAL | 0 refills | Status: DC | PRN

## 2023-10-26 MED ORDER — ALPRAZOLAM 0.25 MG PO TABS: 0.2500 mg | ORAL_TABLET | Freq: Every evening | ORAL | 2 refills | Status: DC | PRN

## 2023-10-26 NOTE — Progress Notes (Signed)
 Patient Profile:   Kathy Bean  is a 73 y.o.  female Chief Complaint  Patient presents with  . Follow-up      PROBLEM LIST: Past Medical History:  Diagnosis Date  . Allergic rhinitis   . Allergy   . Anxiety   . Depression   . HTN (hypertension) 08/21/2013  . Hypothyroidism, unspecified 07/18/2018  . Malignant neoplasm of central portion of left female breast (CMS/HHS-HCC) 04/16/2015   ER/PR positive, post left lumpectomy, sentinel lymph node negative, post XRT, [facility] , 2016  . Osteoarthritis   . Parkinson disease (CMS/HHS-HCC)     Past Surgical History:  Procedure Laterality Date  . LAMINECTOMY POSTERIOR LUMBAR FACETECTOMY & FORAMINOTOMY W/DECOMP Bilateral 10/10/2013   Procedure: BILATERAL LUMBAR DECOMPRESSION L2-3, L3-4, L4-5;  Surgeon: Debby Lorrene Kaufmann, MD;  Location: [facility] OR;  Service: Orthopedics;  Laterality: Bilateral;  . LAMINECTOMY POSTERIOR CERVICLE DECOMP W/FACETECTOMY & FORAMINOTOMY Left 10/10/2013   Procedure: 63048 x 3...SABRASABRAEXCISION CENTRAL LEFT DISC HERNIATION L 2-3;  Surgeon: Debby Lorrene Kaufmann, MD;  Location: [facility] OR;  Service: Orthopedics;  Laterality: Left;  SABRA MASTECTOMY Left 11/29/2014   Partial - Dr Unknown Sharps  . D&C    . TONSILLECTOMY     73 yrs old ?    ALLERGIES: No Known Allergies  CURRENT MEDICATIONS: Current Outpatient Medications  Medication Sig Dispense Refill  . acetaminophen  (TYLENOL ) 325 MG tablet Take 650 mg by mouth every 4 (four) hours as needed for Pain Per patient , 2 tabs bid    . ALPRAZolam  (XANAX ) 0.5 MG tablet TAKE 1 TABLET (0.5 MG TOTAL) BY MOUTH AT BEDTIME AS NEEDED FOR SLEEP 30 tablet 5  . carbidopa -levodopa  (SINEMET ) 25-100 mg tablet Take 2 tablets by mouth 4 (four) times daily 720 tablet 3  . citalopram  (CELEXA ) 20 MG tablet TAKE 1 AND 1/2 TABLETS (30 MG TOTAL) BY MOUTH ONCE DAILY 135 tablet 3  . cyanocobalamin  (VITAMIN B12) 1000 MCG tablet Take 1,000 mcg by mouth once daily as needed     . fexofenadine (ALLEGRA) 180 MG tablet Take 180 mg by mouth once daily.    . KLOR-CON  10 10 mEq ER tablet TAKE 1 TABLET TWICE DAILY 180 tablet 3  . levothyroxine  (SYNTHROID ) 75 MCG tablet TAKE 1 TABLET EVERY DAY ON AN EMPTY STOMACH WITH A GLASS OF WATER AT LEAST 30 TO 60 MINUTES BEFORE BREAKFAST 90 tablet 3  . omeprazole (PRILOSEC) 20 MG DR capsule TAKE 1 CAPSULE ONE TIME DAILY 90 capsule 3  . triamterene -hydroCHLOROthiazide  (MAXZIDE -25) 37.5-25 mg tablet Take 2 tablets by mouth once daily as needed     No current facility-administered medications for this visit.      HPI   CLINICAL SUMMARY:  Patient posthospitalization for right leg weakness.  Underwent thoracic and lumbar MRIs which showed a nominal T12 compression fracture of the thoracic MRI and a lumbar MRI did show some mild canal stenosis at L4-5.  Dr. Clois did not think surgery would make a difference.  She has been in aggressive physical therapy and now is able to walk almost unassisted but with a walker.  She has significant right knee pain that is keeping her from standing up and has been fairly limiting to her.  ROS: Review of systems is unremarkable for any active cardiac, respiratory, GI, GU, hematologic, neurologic, dermatologic, HEENT, or psychiatric symptoms except as noted  above, 10 systems reviewed.  No fevers, chills, or constitutional symptoms.   PHYSICAL EXAM  Vital signs:  BP 124/80   Pulse 107   SpO2 95%  There is no height or weight on file to calculate BMI.   Wt Readings from Last 3 Encounters:  09/28/23 81.2 kg (179 lb 0.2 oz)  09/08/23 81.2 kg (179 lb)  04/30/23 81.4 kg (179 lb 6.4 oz)     BP Readings from Last 3 Encounters:  10/26/23 124/80  10/05/23 132/84  09/28/23 139/88    Constitutional:NAD Neck: supple, no thyromegaly, good ROM Respiratory:clear to auscultation, no rales or wheezes Cardiovascular:RRR, no murmur or gallop Abdominal:soft, good BS, NT Ext: no edema, good peripheral  pulses, right knee with significant crepitance Neuro: alert and oriented X 3, grossly nonfocal     ASSESSMENT/PLAN   Right leg weakness-appears not to be related to L4-5 disease.  Physical therapy making a difference.  Will need neurology thoughts about parkinsonism post discharge from skilled nursing.  Currently able to make progress and I really challenged her to really push her physical therapy Right knee osteoarthritis-somewhat limiting, injection given so that would be less of a barrier in her getting up and around Parkinsonism-will need neurology thoughts post discharge  Patient currently at [facility] rehab  Dispo:   No follow-ups on file.

## 2023-11-02 ENCOUNTER — Ambulatory Visit: Admitting: Orthopedic Surgery

## 2023-11-04 ENCOUNTER — Non-Acute Institutional Stay (SKILLED_NURSING_FACILITY): Payer: Self-pay | Admitting: Nurse Practitioner

## 2023-11-04 ENCOUNTER — Encounter: Payer: Self-pay | Admitting: Nurse Practitioner

## 2023-11-04 DIAGNOSIS — E039 Hypothyroidism, unspecified: Secondary | ICD-10-CM | POA: Diagnosis not present

## 2023-11-04 DIAGNOSIS — S22080D Wedge compression fracture of T11-T12 vertebra, subsequent encounter for fracture with routine healing: Secondary | ICD-10-CM

## 2023-11-04 DIAGNOSIS — I10 Essential (primary) hypertension: Secondary | ICD-10-CM

## 2023-11-04 DIAGNOSIS — G20A1 Parkinson's disease without dyskinesia, without mention of fluctuations: Secondary | ICD-10-CM | POA: Diagnosis not present

## 2023-11-04 DIAGNOSIS — M150 Primary generalized (osteo)arthritis: Secondary | ICD-10-CM

## 2023-11-04 DIAGNOSIS — K219 Gastro-esophageal reflux disease without esophagitis: Secondary | ICD-10-CM

## 2023-11-04 NOTE — Progress Notes (Signed)
 Location:  Other [facility].  Nursing Home Room Number: [facility] [id number] Place of Service:  SNF [phone]) Harlene An, NP  PCP: Cleotilde Oneil FALCON, MD  Patient Care Team: Cleotilde Oneil FALCON, MD as PCP - General (Internal Medicine)  Extended Emergency Contact Information Primary Emergency Contact: [facility] Address: [address]          [place], [place] [address] [place] Home Phone: [phone] Mobile Phone: [phone] Relation: Spouse  Goals of care: Advanced Directive information    10/15/2023   10:12 AM  Advanced Directives  Does Patient Have a Medical Advance Directive? No;Yes  Type of Advance Directive Out of facility DNR (pink MOST or yellow form)  Does patient want to make changes to medical advance directive? No - Patient declined     Chief Complaint  Patient presents with   Discharge Note    Discharge.    Discussed the use of AI scribe software for clinical note transcription with the patient, who gave verbal consent to proceed.  HPI:  Pt is a 73 y.o. female seen today for Discharge after rehabilitation for a T12 compression fracture.  She was admitted to a skilled nursing facility for rehabilitation following a fall. she was hospitalized between Sept 2,  2025, and October 14, 2023. Fall resulting in a T12 compression fracture. She has been wearing a brace and reports significant improvement in pain since the incident.  She has a history of osteoarthritis, Parkinson's disease, and low back pain with sciatica. Prior to the fall, she experienced worsening back and knee pain, with her knee giving out and her foot locking, contributing to immobility and increased risk of falls. The pain has improved significantly during her stay at the facility.  Her current medications include Tylenol  for pain, omeprazole for acid reflux, amlodipine  and Maxzide  (triamterene /hydrochlorothiazide ) for blood pressure, Celexa  (citalopram ) for mood, Synthroid  (levothyroxine )  for hypothyroidism, and Sinemet  (carbidopa /levodopa ) for Parkinson's disease. She also takes alprazolam  (Xanax ) as needed at bedtime, which she has been using nightly.  No recent falls since being at the facility. No swelling, constipation, or diarrhea. Her mood has been stable on Celexa , and she has not noticed any specific symptoms when due for a dose of Sinemet , although it has helped with her balance and mobility in the past.  Past Medical History:  Diagnosis Date   Breast cancer (HCC) 11/2014   left   Cancer (HCC) 2016   left breast   Depression    Dysrhythmia    heart racing   Family history of adverse reaction to anesthesia    brother had difficulty going under anesthseia   Headache    hx of migraines   period induced   Hypertension    Hypothyroidism    Memory changes    Osteoarthritis    Parkinson disease (HCC)    Personal history of radiation therapy    Past Surgical History:  Procedure Laterality Date   BREAST BIOPSY Left 10/2014   positive   BREAST LUMPECTOMY Left 2016   invasive mammary   CATARACT EXTRACTION W/PHACO Right 11/27/2021   Procedure: CATARACT EXTRACTION PHACO AND INTRAOCULAR LENS PLACEMENT (IOC) RIGHT 6.56 00:47.9;  Surgeon: Enola Feliciano Hugger, MD;  Location: [facility];  Service: Ophthalmology;  Laterality: Right;   CATARACT EXTRACTION W/PHACO Left 12/11/2021   Procedure: CATARACT EXTRACTION PHACO AND INTRAOCULAR LENS PLACEMENT (IOC) LEFT 3.47 00:29.2;  Surgeon: Enola Feliciano Hugger, MD;  Location: [facility];  Service: Ophthalmology;  Laterality: Left;  COLONOSCOPY Left 07/17/2018   Procedure: COLONOSCOPY;  Surgeon: Janalyn Keene NOVAK, MD;  Location: [facility] ENDOSCOPY;  Service: Endoscopy;  Laterality: Left;   DILATION AND CURETTAGE OF UTERUS     ESOPHAGOGASTRODUODENOSCOPY Left 07/17/2018   Procedure: ESOPHAGOGASTRODUODENOSCOPY (EGD);  Surgeon: Janalyn Keene NOVAK, MD;  Location: [facility] ENDOSCOPY;  Service: Endoscopy;  Laterality: Left;    Laminectomy Posterior Cervicle Decomp W/Facetectomy & Foraminotomy     [id number] x 3...SABRASABRAEXCISION CENTRAL LEFT DISC HERNIATION L 2-3; Surgeon: Debby Lorrene Kaufmann, MD; Location: [facility] OR; Service: Orthopedics; Laterality: Left   Laminectomy Posterior Lumbar Facetectomy & Foraminotomy W/Decomp     BILATERAL LUMBAR DECOMPRESSION L2-3, L3-4, L4-5; Surgeon: Debby Lorrene Kaufmann, MD; Location: [facility] OR; Service: Orthopedics; Laterality: Bilateral;   PARTIAL MASTECTOMY WITH NEEDLE LOCALIZATION Left 11/29/2014   Procedure: PARTIAL MASTECTOMY WITH NEEDLE LOCALIZATION;  Surgeon: Larinda Unknown Sharps, MD;  Location: [facility] ORS;  Service: General;  Laterality: Left;   SENTINEL NODE BIOPSY Left 11/29/2014   Procedure: SENTINEL NODE BIOPSY;  Surgeon: Larinda Unknown Sharps, MD;  Location: [facility] ORS;  Service: General;  Laterality: Left;    No Known Allergies  Outpatient Encounter Medications as of 11/04/2023  Medication Sig   acetaminophen  (TYLENOL ) 325 MG tablet Take 650 mg by mouth in the morning and at bedtime.   acetaminophen  (TYLENOL ) 325 MG tablet Take 650 mg by mouth every 4 (four) hours as needed.   ALPRAZolam  (XANAX ) 0.25 MG tablet Take 1 tablet (0.25 mg total) by mouth at bedtime as needed for anxiety.   carbidopa -levodopa  (SINEMET  IR) 25-100 MG tablet Take 2 tablets by mouth 4 (four) times daily.   citalopram  (CELEXA ) 20 MG tablet Take 30 mg by mouth daily.   Cyanocobalamin  (RA VITAMIN B-12 TR) 1000 MCG TBCR Take 1,000 mcg by mouth daily.    fexofenadine (ALLEGRA) 180 MG tablet Take 180 mg by mouth daily.   levothyroxine  (SYNTHROID ) 75 MCG tablet Take 75 mcg by mouth daily before breakfast.   omeprazole (PRILOSEC) 20 MG capsule Take 20 mg by mouth daily.   polyethylene glycol (MIRALAX  / GLYCOLAX ) 17 g packet Take 17 g by mouth 2 (two) times daily as needed.   potassium chloride  (K-DUR) 10 MEQ tablet Take 10 mEq by mouth 2 (two) times daily.    triamterene -hydrochlorothiazide  (MAXZIDE -25) 37.5-25 MG tablet  Take 2 tablets by mouth daily.    No facility-administered encounter medications on file as of 11/04/2023.    Review of Systems  Constitutional:  Negative for activity change, appetite change, fatigue and unexpected weight change.  HENT:  Negative for congestion and hearing loss.   Eyes: Negative.   Respiratory:  Negative for cough and shortness of breath.   Cardiovascular:  Negative for chest pain, palpitations and leg swelling.  Gastrointestinal:  Negative for abdominal pain, constipation and diarrhea.  Genitourinary:  Negative for difficulty urinating and dysuria.  Musculoskeletal:  Negative for arthralgias and myalgias.  Skin:  Negative for color change and wound.  Neurological:  Positive for weakness. Negative for dizziness.  Psychiatric/Behavioral:  Negative for agitation, behavioral problems and confusion.      Immunization History  Administered Date(s) Administered   Influenza Inj Mdck Quad Pf 11/08/2018, 11/20/2020, 11/25/2021   Influenza,inj,Quad PF,6+ Mos 11/14/2019   PFIZER(Purple Top)SARS-COV-2 Vaccination 04/01/2019, 04/25/2019   Pneumococcal Conjugate-13 03/06/2017   Zoster Recombinant(Shingrix) 02/27/2017   Pertinent  Health Maintenance Due  Topic Date Due   Influenza Vaccine  09/10/2023   Colonoscopy  07/16/2028   DEXA SCAN  Completed   Mammogram  Discontinued  03/02/2019    9:45 AM 03/03/2019    2:25 PM 07/06/2020    9:17 PM 11/27/2021   12:56 PM 12/11/2021   12:32 PM  Fall Risk  (RETIRED) Patient Fall Risk Level High fall risk  High fall risk  Moderate fall risk  Moderate fall risk  Low fall risk      Data saved with a previous flowsheet row definition   Functional Status Survey:    Vitals:   11/04/23 0944  BP: 132/86  Pulse: 96  Resp: 18  Temp: (!) 96.9 F (36.1 C)  SpO2: 95%  Weight: 185 lb 6.4 oz (84.1 kg)  Height: 5' 7 (1.702 m)   Body mass index is 29.04 kg/m. Physical Exam Constitutional:      General: She is not in acute  distress.    Appearance: She is well-developed. She is not diaphoretic.  HENT:     Head: Normocephalic and atraumatic.     Mouth/Throat:     Pharynx: No oropharyngeal exudate.  Eyes:     Conjunctiva/sclera: Conjunctivae normal.     Pupils: Pupils are equal, round, and reactive to light.  Cardiovascular:     Rate and Rhythm: Normal rate and regular rhythm.     Heart sounds: Normal heart sounds.  Pulmonary:     Effort: Pulmonary effort is normal.     Breath sounds: Normal breath sounds.  Abdominal:     General: Bowel sounds are normal.     Palpations: Abdomen is soft.  Musculoskeletal:     Cervical back: Normal range of motion and neck supple.     Right lower leg: No edema.     Left lower leg: No edema.  Skin:    General: Skin is warm and dry.  Neurological:     Mental Status: She is alert.     Motor: Weakness present.     Gait: Gait abnormal.  Psychiatric:        Mood and Affect: Mood normal.     Labs reviewed: Recent Labs    10/12/23 1434 10/13/23 0443 10/14/23 0449  NA 144 141 140  K 3.9 3.8 3.4*  CL 105 104 102  CO2 28 27 27   GLUCOSE 103* 119* 117*  BUN 18 19 14   CREATININE 0.79 0.88 0.84  CALCIUM  9.7 9.5 9.7   Recent Labs    10/12/23 1434  AST 22  ALT 6  ALKPHOS 68  BILITOT 0.5  PROT 6.8  ALBUMIN 3.9   Recent Labs    10/12/23 1434 10/13/23 0443  WBC 7.2 8.5  HGB 14.9 14.2  HCT 47.4* 46.8*  MCV 99.0 103.5*  PLT 267 245   Lab Results  Component Value Date   TSH 2.920 08/09/2018   No results found for: HGBA1C No results found for: CHOL, HDL, LDLCALC, LDLDIRECT, TRIG, CHOLHDL  Significant Diagnostic Results in last 30 days:  MR THORACIC SPINE WO CONTRAST Result Date: 10/14/2023 EXAM: MR Thoracic Spine without 10/13/2023 10:14:02 PM TECHNIQUE: Multiplanar multisequence MRI of the thoracic spine was performed without the administration of intravenous contrast. COMPARISON: MRI of the lumbar spine dated 10/12/2023. CLINICAL HISTORY:  Back trauma, abnormal neuro exam, CT or xray positive (Age >= 16y). Difficulty walking and sciatica. Pain in lower back in the midline. Magnetic gait. FINDINGS: BONES AND ALIGNMENT: Normal alignment. An acute mild compression fracture of T12 was again demonstrated. The vertebral body has lost approximately 10% of its height anteriorly and there are edematous changes present within the superior aspect of  the vertebral body. The posterior wall is intact. The other thoracic vertebrae maintained their height. SPINAL CORD: Normal spinal cord volume. Normal spinal cord signal. SOFT TISSUES: Unremarkable. DEGENERATIVE CHANGES: Mild disc space narrowing at multiple levels throughout the thoracic spine. Minimal disc bulging at T2-3 and T7-8. No significant spinal canal or neural foraminal stenosis. IMPRESSION: 1. Acute mild compression fracture of T12 with approximately 10% anterior height loss and edematous changes. The posterior wall is intact. 2. Minimal disc bulging at T2-3 and T7-8 without significant spinal canal or neural foraminal stenosis. Electronically signed by: Evalene Coho MD 10/14/2023 04:11 AM EDT RP Workstation: [id number]   MR LUMBAR SPINE WO CONTRAST Result Date: 10/12/2023 EXAM: MRI LUMBAR SPINE 10/12/2023 07:47:08 PM TECHNIQUE: Multiplanar multisequence MRI of the lumbar spine was performed without the administration of intravenous contrast. COMPARISON: 12/07/2022 CLINICAL HISTORY: Lower back pain, bilateral leg aching. Patient reports multiple falls recently and has been unable to walk in the past week, previously able to use a walker. History of back problems and Parkinson's disease. Complains of right knee pain and ongoing sciatica on that side. FINDINGS: BONES AND ALIGNMENT: Wedge compression fracture of T12 with less than 25% height loss and moderate bone marrow edema, likely acute. According to the AO Spine classification of thoracolumbar injuries, the finding is consistent with a T12: A1  (wedge compression fracture with less than 25% height loss, no posterior wall involvement). Grade 1 anterolisthesis at L4-5 and grade 1 retrolisthesis at L5-S1. SPINAL CORD: The conus terminates normally. SOFT TISSUES: No paraspinal mass. L1-L2: No significant disc herniation. No spinal canal stenosis or neural foraminal narrowing. L2-L3: Small left asymmetric disc bulge. No central spinal canal or neural foraminal stenosis. L3-L4: Posterior decompression. Small subarticular disc protrusion. No spinal canal stenosis or neural foraminal narrowing. L4-L5: Moderate facet arthrosis and mild spinal canal stenosis. No neural impingement. L5-S1: Small central disc protrusion. No central spinal canal or neural foraminal stenosis. VASCULATURE: Unchanged size of infrarenal abdominal aortic aneurysm measuring 3.2 cm with eccentric mural thrombus. According to the ACR 2013 and SVS 2018 guidelines, the finding is consistent with a small abdominal aortic aneurysm (3.2 cm), and the recommendation is surveillance imaging at a 3-year interval. IMPRESSION: 1. Acute wedge compression fracture of T12 with less than 25% height loss and moderate bone marrow edema. Conservative treatment recommended. (AO Spine T12: A1) 2. Grade 1 anterolisthesis at L4-5 and grade 1 retrolisthesis at L5-S1. 3. Mild spinal canal stenosis at L4-5 without neural impingement. 4. Unchanged abdominal aortic aneurysm (3.2 cm) with eccentric mural thrombus. Surveillance imaging at a 3-year interval recommended. Electronically signed by: Franky Stanford MD 10/12/2023 09:18 PM EDT RP Workstation: [id number]   DG Tibia/Fibula Left Result Date: 10/12/2023 CLINICAL DATA:  Status post fall a week ago with ongoing pain. EXAM: LEFT TIBIA AND FIBULA - 2 VIEW COMPARISON:  None Available. FINDINGS: There is no evidence of acute or healing fracture. Cortical margins are intact. No erosions or periostitis. No ankle or knee dislocation. No focal soft tissue abnormalities.  IMPRESSION: Negative radiographs of the left tibia and fibula. Electronically Signed   By: Andrea Gasman M.D.   On: 10/12/2023 16:28    Assessment/Plan T12 vertebral compression fracture, recent Recent T12 vertebral compression fracture secondary to a fall. Pain improved with rehabilitation and brace use. Neurosurgery involved during hospital stay and reports outpatient follow up scheduled.   Parkinson's disease Parkinson's disease affects balance and mobility. Sinemet  effective in improving symptoms. - Continue Sinemet  as prescribed.  Osteoarthritis Osteoarthritis causes  knee pain and instability, worsened by recent fall. - Continue Tylenol  for pain management.  Hypertension Hypertension well-controlled with amlodipine  and Maxzide . Stable blood pressure readings. - Continue amlodipine  and Maxzide  as prescribed.  Hypothyroidism Hypothyroidism managed with Synthroid  75 mcg. Recent TSH levels normal. - Continue Synthroid  75 mcg as prescribed.  Gastroesophageal reflux disease GERD managed with omeprazole. Continue home regimen  Depression Depression managed with Celexa . Mood stable. Continue home regimen of celexa   pt is stable for discharge-will need PT/OT per home health. DME needed includes wheeled walker.  Has follow up with PCP in 1 week Rx sent from facility  Pierre Cumpton K. Caro BODILY [facility] [phone]

## 2023-11-10 NOTE — Progress Notes (Unsigned)
 Referring Physician:  Cleotilde Oneil FALCON, MD [address] [facility] [place],  [place] [address]  Primary Physician:  Cleotilde Oneil FALCON, MD  History of Present Illness: 11/11/2023 [name] has a history of HTN, hypothyroidism, parkinson's disease, GERD, anxiety, depression, breast CA, chronic anemia, peripheral neuropathy, chronic ETOH use, hydrocephalus.   Seen by Dr. Clois as hospital consult on 10/13/23 for a T12 compression fracture. She was to wear TLSO brace. He discussed kyphoplasty with her and she declined.   She went to SNF and was discharged to home on 11/04/23.   She is here for follow up.   She is not wearing brace much since she has been home. She has no current pain in her back. She has no leg pain. No neck or arm pain. She continues with balance issues and thinks they may be worse in last 6 months. No dexterity issues.  Tobacco use: Does not smoke.   Bowel/Bladder Dysfunction: none  The symptoms are causing a significant impact on the patient's life.   Review of Systems:  A 10 point review of systems is negative, except for the pertinent positives and negatives detailed in the HPI.  Past Medical History: Past Medical History:  Diagnosis Date   Breast cancer (HCC) 11/2014   left   Cancer (HCC) 2016   left breast   Depression    Dysrhythmia    heart racing   Family history of adverse reaction to anesthesia    brother had difficulty going under anesthseia   Headache    hx of migraines   period induced   Hypertension    Hypothyroidism    Memory changes    Osteoarthritis    Parkinson disease (HCC)    Personal history of radiation therapy     Past Surgical History: Past Surgical History:  Procedure Laterality Date   BREAST BIOPSY Left 10/2014   positive   BREAST LUMPECTOMY Left 2016   invasive mammary   CATARACT EXTRACTION W/PHACO Right 11/27/2021   Procedure: CATARACT EXTRACTION PHACO AND INTRAOCULAR LENS PLACEMENT  (IOC) RIGHT 6.56 00:47.9;  Surgeon: Enola Feliciano Hugger, MD;  Location: [facility];  Service: Ophthalmology;  Laterality: Right;   CATARACT EXTRACTION W/PHACO Left 12/11/2021   Procedure: CATARACT EXTRACTION PHACO AND INTRAOCULAR LENS PLACEMENT (IOC) LEFT 3.47 00:29.2;  Surgeon: Enola Feliciano Hugger, MD;  Location: [facility];  Service: Ophthalmology;  Laterality: Left;   COLONOSCOPY Left 07/17/2018   Procedure: COLONOSCOPY;  Surgeon: Janalyn Keene NOVAK, MD;  Location: [facility] ENDOSCOPY;  Service: Endoscopy;  Laterality: Left;   DILATION AND CURETTAGE OF UTERUS     ESOPHAGOGASTRODUODENOSCOPY Left 07/17/2018   Procedure: ESOPHAGOGASTRODUODENOSCOPY (EGD);  Surgeon: Janalyn Keene NOVAK, MD;  Location: [facility] ENDOSCOPY;  Service: Endoscopy;  Laterality: Left;   Laminectomy Posterior Cervicle Decomp W/Facetectomy & Foraminotomy     [id number] x 3...SABRASABRAEXCISION CENTRAL LEFT DISC HERNIATION L 2-3; Surgeon: Debby Lorrene Kaufmann, MD; Location: [facility] OR; Service: Orthopedics; Laterality: Left   Laminectomy Posterior Lumbar Facetectomy & Foraminotomy W/Decomp     BILATERAL LUMBAR DECOMPRESSION L2-3, L3-4, L4-5; Surgeon: Debby Lorrene Kaufmann, MD; Location: [facility] OR; Service: Orthopedics; Laterality: Bilateral;   PARTIAL MASTECTOMY WITH NEEDLE LOCALIZATION Left 11/29/2014   Procedure: PARTIAL MASTECTOMY WITH NEEDLE LOCALIZATION;  Surgeon: Larinda Unknown Sharps, MD;  Location: [facility] ORS;  Service: General;  Laterality: Left;   SENTINEL NODE BIOPSY Left 11/29/2014   Procedure: SENTINEL NODE BIOPSY;  Surgeon: Larinda Unknown Sharps, MD;  Location: [facility] ORS;  Service:  General;  Laterality: Left;    Allergies: Allergies as of 11/11/2023   (No Known Allergies)    Medications: Outpatient Encounter Medications as of 11/11/2023  Medication Sig   acetaminophen  (TYLENOL ) 325 MG tablet Take 650 mg by mouth in the morning and at bedtime.   acetaminophen  (TYLENOL ) 325 MG tablet Take 650 mg by mouth every 4 (four) hours  as needed.   ALPRAZolam  (XANAX ) 0.25 MG tablet Take 1 tablet (0.25 mg total) by mouth at bedtime as needed for anxiety.   carbidopa -levodopa  (SINEMET  IR) 25-100 MG tablet Take 2 tablets by mouth 4 (four) times daily.   citalopram  (CELEXA ) 20 MG tablet Take 30 mg by mouth daily.   Cyanocobalamin  (RA VITAMIN B-12 TR) 1000 MCG TBCR Take 1,000 mcg by mouth daily.    fexofenadine (ALLEGRA) 180 MG tablet Take 180 mg by mouth daily.   levothyroxine  (SYNTHROID ) 75 MCG tablet Take 75 mcg by mouth daily before breakfast.   omeprazole (PRILOSEC) 20 MG capsule Take 20 mg by mouth daily.   polyethylene glycol (MIRALAX  / GLYCOLAX ) 17 g packet Take 17 g by mouth 2 (two) times daily as needed.   potassium chloride  (K-DUR) 10 MEQ tablet Take 10 mEq by mouth 2 (two) times daily.    triamterene -hydrochlorothiazide  (MAXZIDE -25) 37.5-25 MG tablet Take 2 tablets by mouth daily.    No facility-administered encounter medications on file as of 11/11/2023.    Social History: Social History   Tobacco Use   Smoking status: Former    Current packs/day: 0.00    Types: Cigarettes    Quit date: 11/22/1999    Years since quitting: 23.9   Smokeless tobacco: Never  Vaping Use   Vaping status: Never Used  Substance Use Topics   Alcohol use: Not Currently    Comment: Rare   Drug use: No    Family Medical History: Family History  Problem Relation Age of Onset   Breast cancer Maternal Aunt    Breast cancer Paternal Aunt    Coronary artery disease Mother    Heart attack Mother    Coronary artery disease Paternal Grandmother     Physical Examination: Vitals:   11/11/23 1301  BP: 110/78      Awake, alert, oriented to person, place, and time.  Speech is clear and fluent. Fund of knowledge is appropriate.   Cranial Nerves: Pupils equal round and reactive to light.  Facial tone is symmetric.    No thoracic or lumbar tenderness noted. No tenderness over T12.   No abnormal lesions on exposed skin.    Strength: Side Biceps Triceps Deltoid Interossei Grip Wrist Ext. Wrist Flex.  R 5 5 5 5 5 5 5   L 5 5 5 5 5 5 5    Side Iliopsoas Quads Hamstring PF DF EHL  R 5 5 5 5 5 5   L 5 5 5 5 5 5    Reflexes are 3+ and symmetric at the biceps, brachioradialis, patella and achilles.   Hoffman's is absent.  She has 2-3 beats clonus in both lower extremities.  Bilateral upper and lower extremity sensation is intact to light touch.     She ambulates with walker. Gait is slightly unsteady.   Medical Decision Making  Imaging: none  Assessment and Plan: Kathy Bean has known T12 compression fracture that was seen on imaging on 10/13/23. She was having difficulty walking.   She has no current pain in her mid or lower back. No neck pain. No arm or leg pain. She  notes persistent balance issues, thinks they may be worse over last 6 months.   No new imaging. She is hyper reflexia on exam, has balance issues, and has 2-3 beats clonus in bilateral lower extremities.   Treatment options discussed with patient and following plan made:   - MRI of cervical spine to further evaluate hyper reflexia and balance issues.  - When she has MRI done, she will get lumbar xrays to check T12 fracture.  - She should wear her brace when up and out of bed. No bending, twisting, or lifting.  - Will do phone visit to review above imaging results.  - Follow up with me in 4-6 weeks for recheck of T12 fracture. Will get xrays prior to that visit as well.   I spent a total of 30 minutes in face-to-face and non-face-to-face activities related to this patient's care today including review of outside records, review of imaging, review of symptoms, physical exam, discussion of differential diagnosis, discussion of treatment options, and documentation.   Thank you for involving me in the care of this patient.   Glade Boys PA-C Dept. of Neurosurgery

## 2023-11-11 ENCOUNTER — Encounter: Payer: Self-pay | Admitting: Orthopedic Surgery

## 2023-11-11 ENCOUNTER — Ambulatory Visit: Admitting: Orthopedic Surgery

## 2023-11-11 VITALS — BP 110/78 | Wt 188.4 lb

## 2023-11-11 DIAGNOSIS — R292 Abnormal reflex: Secondary | ICD-10-CM

## 2023-11-11 DIAGNOSIS — S22080D Wedge compression fracture of T11-T12 vertebra, subsequent encounter for fracture with routine healing: Secondary | ICD-10-CM | POA: Diagnosis not present

## 2023-11-11 DIAGNOSIS — R2689 Other abnormalities of gait and mobility: Secondary | ICD-10-CM | POA: Diagnosis not present

## 2023-11-11 NOTE — Patient Instructions (Signed)
 It was so nice to see you today. Thank you so much for coming in.    You have a broken bone at T12. I am glad you are feeling better.   I still recommend that you wear your brace when up and walking. No bending, twisting, or lifting.   I want to get an MRI of your neck to look into things further and make sure your balance issues are not from your neck. We will get this approved through your insurance and [facility] will call you to schedule the appointment. Ask about your patient responsibility. You do not need to pay this prior to getting MRI, they can bill you.   [facility] (building with the white pillars) is located off of [address]. The address is [address], [place], [place] [address].    If they don't call you about the MRI today, you can call them at [phone] to get it scheduled.   Remind them to get your lower back xrays when you have the MRI done.    After you have the MRI and xrays, it can take 14-28 days for me to get the results back. If I don't have them in 2 weeks, we will call to try to get the results.   Once I have the results, we will call you to schedule a follow up phone visit with me to review them.   I will see you back in 4-6 weeks. You will need xrays prior to that visit and you can get them at our office. We will call and let you know when to show up for the xrays. Please do not hesitate to call if you have any questions or concerns. You can also message me in MyChart.   Glade Boys PA-C [phone]     The physicians and staff at [facility] Neurosurgery at [facility] are committed to providing excellent care. You may receive a survey asking for feedback about your experience at our office. We value you your feedback and appreciate you taking the time to to fill it out. The [facility] leadership team is also available to discuss your experience in person, feel free to contact us  [phone].

## 2023-11-17 ENCOUNTER — Ambulatory Visit
Admission: RE | Admit: 2023-11-17 | Discharge: 2023-11-17 | Disposition: A | Discharge status: Home / Self Care | Source: Ambulatory Visit | Attending: Orthopedic Surgery | Admitting: Orthopedic Surgery

## 2023-11-17 DIAGNOSIS — R292 Abnormal reflex: Secondary | ICD-10-CM | POA: Insufficient documentation

## 2023-11-17 DIAGNOSIS — S22080D Wedge compression fracture of T11-T12 vertebra, subsequent encounter for fracture with routine healing: Secondary | ICD-10-CM | POA: Diagnosis present

## 2023-11-17 DIAGNOSIS — R2689 Other abnormalities of gait and mobility: Secondary | ICD-10-CM | POA: Diagnosis present

## 2023-11-22 NOTE — Progress Notes (Unsigned)
 Referring Physician:  Cleotilde Oneil FALCON, MD [phone] [facility] [address] [facility] [place],  [place] [address]  Primary Physician:  Cleotilde Oneil FALCON, MD  History of Present Illness: [name] has a history of HTN, hypothyroidism, parkinson's disease, GERD, anxiety, depression, breast CA, chronic anemia, peripheral neuropathy, chronic ETOH use, hydrocephalus.   Seen by Dr. Clois as hospital consult on 10/13/23 for a T12 compression fracture. She was to wear TLSO brace. He discussed kyphoplasty with her and she declined.   Last seen by me on 11/11/23 and she was dong well with no pain. She did complain of persistent balance issues that have gotten worse. She was hyper reflexic on exam and had clonus.   Cervical MRI ordered and she is here to review it. She also needs to get repeat lumbar xrays.   She continues with balance issues. Occasional issues with hand dexterity.   She has intermittent mid to LBP that is only occasional. No leg pain. She has numbness and tingling in her feet. No weakness in her legs. She is not wearing her back brace.   Tobacco use: Does not smoke.   Bowel/Bladder Dysfunction: none  The symptoms are causing a significant impact on the patient's life.   Review of Systems:  A 10 point review of systems is negative, except for the pertinent positives and negatives detailed in the HPI.  Past Medical History: Past Medical History:  Diagnosis Date   Breast cancer (HCC) 11/2014   left   Cancer (HCC) 2016   left breast   Depression    Dysrhythmia    heart racing   Family history of adverse reaction to anesthesia    brother had difficulty going under anesthseia   Headache    hx of migraines   period induced   Hypertension    Hypothyroidism    Memory changes    Osteoarthritis    Parkinson disease (HCC)    Personal history of radiation therapy     Past Surgical History: Past Surgical History:  Procedure Laterality Date   BREAST  BIOPSY Left 10/2014   positive   BREAST LUMPECTOMY Left 2016   invasive mammary   CATARACT EXTRACTION W/PHACO Right 11/27/2021   Procedure: CATARACT EXTRACTION PHACO AND INTRAOCULAR LENS PLACEMENT (IOC) RIGHT 6.56 00:47.9;  Surgeon: Enola Feliciano Hugger, MD;  Location: [facility];  Service: Ophthalmology;  Laterality: Right;   CATARACT EXTRACTION W/PHACO Left 12/11/2021   Procedure: CATARACT EXTRACTION PHACO AND INTRAOCULAR LENS PLACEMENT (IOC) LEFT 3.47 00:29.2;  Surgeon: Enola Feliciano Hugger, MD;  Location: [facility];  Service: Ophthalmology;  Laterality: Left;   COLONOSCOPY Left 07/17/2018   Procedure: COLONOSCOPY;  Surgeon: Janalyn Keene NOVAK, MD;  Location: [facility];  Service: Endoscopy;  Laterality: Left;   DILATION AND CURETTAGE OF UTERUS     ESOPHAGOGASTRODUODENOSCOPY Left 07/17/2018   Procedure: ESOPHAGOGASTRODUODENOSCOPY (EGD);  Surgeon: Janalyn Keene NOVAK, MD;  Location: [facility];  Service: Endoscopy;  Laterality: Left;   Laminectomy Posterior Cervicle Decomp W/Facetectomy & Foraminotomy     [id number] x 3...SABRASABRAEXCISION CENTRAL LEFT DISC HERNIATION L 2-3; Surgeon: Debby Lorrene Kaufmann, MD; Location: [facility]; Service: Orthopedics; Laterality: Left   Laminectomy Posterior Lumbar Facetectomy & Foraminotomy W/Decomp     BILATERAL LUMBAR DECOMPRESSION L2-3, L3-4, L4-5; Surgeon: Debby Lorrene Kaufmann, MD; Location: [facility] OR; Service: Orthopedics; Laterality: Bilateral;   PARTIAL MASTECTOMY WITH NEEDLE LOCALIZATION Left 11/29/2014   Procedure: PARTIAL MASTECTOMY WITH NEEDLE LOCALIZATION;  Surgeon: Larinda Unknown Sharps, MD;  Location:  [facility] ORS;  Service: General;  Laterality: Left;   SENTINEL NODE BIOPSY Left 11/29/2014   Procedure: SENTINEL NODE BIOPSY;  Surgeon: Larinda Unknown Sharps, MD;  Location: [facility] OR[facility];  Service: General;  Laterality: Left;    Allergies: Allergies as of 11/25/2023   (No Known Allergies)    Medications: Outpatient Encounter Medications as of  11/25/2023  Medication Sig   acetaminophen  (TYLENOL ) 325 MG tablet Take 650 mg by mouth in the morning and at bedtime.   acetaminophen  (TYLENOL ) 325 MG tablet Take 650 mg by mouth every 4 (four) hours as needed.   ALPRAZolam  (XANAX ) 0.25 MG tablet Take 1 tablet (0.25 mg total) by mouth at bedtime as needed for anxiety.   carbidopa -levodopa  (SINEMET  IR) 25-100 MG tablet Take 2 tablets by mouth 4 (four) times daily.   citalopram  (CELEXA ) 20 MG tablet Take 30 mg by mouth daily.   Cyanocobalamin  (RA VITAMIN B-12 TR) 1000 MCG TBCR Take 1,000 mcg by mouth daily.    fexofenadine (ALLEGRA) 180 MG tablet Take 180 mg by mouth daily.   levothyroxine  (SYNTHROID ) 75 MCG tablet Take 75 mcg by mouth daily before breakfast.   omeprazole (PRILOSEC) 20 MG capsule Take 20 mg by mouth daily.   polyethylene glycol (MIRALAX  / GLYCOLAX ) 17 g packet Take 17 g by mouth 2 (two) times daily as needed.   potassium chloride  (K-DUR) 10 MEQ tablet Take 10 mEq by mouth 2 (two) times daily.    triamterene -hydrochlorothiazide  (MAXZIDE -25) 37.5-25 MG tablet Take 2 tablets by mouth daily.    No facility-administered encounter medications on file as of 11/25/2023.    Social History: Social History   Tobacco Use   Smoking status: Former    Current packs/day: 0.00    Types: Cigarettes    Quit date: 11/22/1999    Years since quitting: 24.0   Smokeless tobacco: Never  Vaping Use   Vaping status: Never Used  Substance Use Topics   Alcohol use: Not Currently    Comment: Rare   Drug use: No    Family Medical History: Family History  Problem Relation Age of Onset   Breast cancer Maternal Aunt    Breast cancer Paternal Aunt    Coronary artery disease Mother    Heart attack Mother    Coronary artery disease Paternal Grandmother     Physical Examination: Vitals:   11/25/23 1148  BP: 110/70       Awake, alert, oriented to person, place, and time.  Speech is clear and fluent. Fund of knowledge is appropriate.    Cranial Nerves: Pupils equal round and reactive to light.  Facial tone is symmetric.    No thoracic or lumbar tenderness noted. No tenderness over T12.   No abnormal lesions on exposed skin.   Strength: Side Biceps Triceps Deltoid Interossei Grip Wrist Ext. Wrist Flex.  R 5 5 5 5 5 5 5   L 5 5 5 5 5 5 5    Side Iliopsoas Quads Hamstring PF DF EHL  R 5 5 5 5 5 5   L 5 5 5 5 5 5    Reflexes are 3+ and symmetric at the biceps, brachioradialis, patella and achilles.   Hoffman's is absent.  She has 2-3 beats clonus in both lower extremities.  Bilateral upper and lower extremity sensation is intact to light touch.     She ambulates with walker. Gait is slightly unsteady.   Medical Decision Making  Imaging: Cervical MRI dated 11/17/23:  FINDINGS: Technical note: Despite efforts by the  technologist and patient, mild motion artifact is present on today's exam and could not be eliminated. This reduces exam sensitivity and specificity. Motion is greatest on the axial images.   Alignment: Reversal of the usual cervical lordosis with mildly progressive stepwise degenerative anterolisthesis at C3-4 and C4-5.   Vertebrae: No acute or suspicious osseous findings. Progressive multilevel facet hypertrophy with new interfacetal ankylosis on the right at C3-4. There is chronic interfacetal ankylosis on the left at C4-5.   Cord: New mild cord flattening at C2-3 without abnormal cord signal.   Posterior Fossa, vertebral arteries, paraspinal tissues: Visualized portions of the posterior fossa appear unremarkable.Bilateral vertebral artery flow voids. No significant paraspinal findings.   Disc levels:   C2-3: Progressive disc bulging and ligamentum flavum thickening, with resulting effacement of the CSF surrounding the cord. The AP diameter of the canal is narrowed to 5 mm, and there is mild cord flattening. The foramina remain patent.   C3-4: Chronic loss of disc height with disc bulging,  uncinate spurring and bilateral facet hypertrophy. As above, new interfacetal ankylosis on the right. No significant spinal stenosis. Stable mild to moderate right and mild left foraminal narrowing.   C4-5: Chronic loss of disc height with disc bulging, uncinate spurring and bilateral facet hypertrophy. As above, chronic interfacetal ankylosis on the left. No significant spinal stenosis. Stable mild left foraminal narrowing.   C5-6: Chronic spondylosis with loss of disc height and posterior osteophytes covering diffusely bulging disc material. Unchanged mild to moderate spinal stenosis with effacement of the CSF surrounding the cord. No significant cord deformity. There is chronic moderate foraminal narrowing bilaterally which appears similar to the previous study.   C6-7: Milder chronic spondylosis at this level with loss of disc height and posterior osteophytes covering diffusely bulging disc material. Unchanged mild spinal stenosis and mild right-greater-than-left foraminal narrowing.   C7-T1: Bilateral facet hypertrophy, greater on the left. No spinal stenosis or significant foraminal narrowing.   IMPRESSION: 1. Compared with previous MRI from 08/22/2019, there is progressive spondylosis at C2-3 with resulting moderate to severe spinal stenosis and mild cord flattening. No abnormal cord signal. 2. Progressive multilevel facet hypertrophy with new interfacetal ankylosis on the right at C3-4. 3. Otherwise similar multilevel cervical spondylosis, most advanced at C5-6 where there is mild to moderate spinal stenosis and moderate foraminal narrowing bilaterally. 4. No acute osseous findings. No evidence of metastatic disease.     Electronically Signed   By: Elsie Perone M.D.   On: 11/21/2023 09:27  I have personally reviewed the images and agree with the above interpretation.   Lumbar xrays dated 11/25/23:  T12 compression fracture that appears stable, slip L4-L5 with  diffuse DDD.   Report for above xrays are not yet available.   Assessment and Plan: Ms. Lazarus has known T12 compression fracture that was seen on imaging on 10/13/23. She has intermittent mid to LBP that is only occasional. No leg pain. She has numbness and tingling in her feet. No weakness in her legs. She is not wearing her back brace.   Lumbar xrays show stable T12 fracture.   She continues with balance issues that are getting worse, some dexterity issues. She is hyper reflexia on exam and has 2-3 beats clonus in bilateral lower extremities.   Cervical MRI shows moderate/severe central stenosis C2-C3 with mild/moderate central stenosis C5-C6.   Treatment options discussed with patient and following plan made:   - Recommend she follow up with Dr. Clois regarding balance issues and cervical  stenosis. Appointment scheduled.  - She should wear her brace when up and out of bed. No bending, twisting, or lifting.  - Follow up with me in 4-6 weeks for recheck of T12 fracture. Will get xrays prior to that visit.   I spent a total of 30 minutes in face-to-face and non-face-to-face activities related to this patient's care today including review of outside records, review of imaging, review of symptoms, physical exam, discussion of differential diagnosis, discussion of treatment options, and documentation.   Glade Boys PA-C Dept. of Neurosurgery

## 2023-11-25 ENCOUNTER — Ambulatory Visit (INDEPENDENT_AMBULATORY_CARE_PROVIDER_SITE_OTHER): Admitting: Orthopedic Surgery

## 2023-11-25 ENCOUNTER — Other Ambulatory Visit

## 2023-11-25 ENCOUNTER — Encounter: Payer: Self-pay | Admitting: Orthopedic Surgery

## 2023-11-25 VITALS — BP 110/70 | Wt 188.0 lb

## 2023-11-25 DIAGNOSIS — M4802 Spinal stenosis, cervical region: Secondary | ICD-10-CM | POA: Diagnosis not present

## 2023-11-25 DIAGNOSIS — S22080D Wedge compression fracture of T11-T12 vertebra, subsequent encounter for fracture with routine healing: Secondary | ICD-10-CM

## 2023-11-25 DIAGNOSIS — R292 Abnormal reflex: Secondary | ICD-10-CM | POA: Diagnosis not present

## 2023-11-25 DIAGNOSIS — R2689 Other abnormalities of gait and mobility: Secondary | ICD-10-CM

## 2023-11-25 DIAGNOSIS — S22089D Unspecified fracture of T11-T12 vertebra, subsequent encounter for fracture with routine healing: Secondary | ICD-10-CM | POA: Diagnosis not present

## 2023-11-25 NOTE — Patient Instructions (Signed)
 It was so nice to see you today. Thank you so much for coming in.    You have a broken bone at T12. I am glad you are feeling better.   I still recommend that you wear your brace when up and walking. No bending, twisting, or lifting.   You have an appointment with Dr. Clois to discuss your neck. You have some pressure on the spinal cord that may be causing your balance issues.   I will see back for your back in about 6 weeks.   Glade Boys PA-C [phone]     The physicians and staff at [facility] at [facility] are committed to providing excellent care. You may receive a survey asking for feedback about your experience at our office. We value you your feedback and appreciate you taking the time to to fill it out. The [facility] leadership team is also available to discuss your experience in person, feel free to contact us  [phone].

## 2023-11-26 ENCOUNTER — Telehealth: Admitting: Orthopedic Surgery

## 2023-12-08 NOTE — H&P (View-Only) (Signed)
 Referring Physician:  Cleotilde Oneil FALCON, MD [address] [facility] [place],  [place] [address]  Primary Physician:  Cleotilde Oneil FALCON, MD  History of Present Illness: 12/14/2023 Kathy Bean is here today with a chief complaint of balance issues and dexterity changes.  She experiences balance issues, feeling unbalanced in every direction, but has not had any recent falls. A fall three months ago resulted in a T12 fracture, which is not painful. She has difficulty with fine motor tasks, such as buttoning clothes, and occasionally drops objects. She uses a walker for mobility at home. Sinemet  provides symptom relief for Parkinson's disease. She underwent major lower back surgery in 2015 with no subsequent lower back pain. There is no numbness or tingling in her arms, no issues with grip strength, and no numbness in her hands.  She is also having some back pain.  She previously had surgery approximately 10 years ago.  Discussed the use of AI scribe software for clinical note transcription with the patient, who gave verbal consent to proceed.   [name] has symptoms of cervical myelopathy.  The symptoms are causing a significant impact on the patient's life.   I have utilized the care everywhere function in epic to review the outside records available from external health systems.  Progress Note from Glade Boys, [place] on 11/25/23:  History of Present Illness: Kathy Bean has a history of HTN, hypothyroidism, parkinson's disease, GERD, anxiety, depression, breast CA, chronic anemia, peripheral neuropathy, chronic ETOH use, hydrocephalus.    Seen by Dr. Clois as hospital consult on 10/13/23 for a T12 compression fracture. She was to wear TLSO brace. He discussed kyphoplasty with her and she declined.    Last seen by me on 11/11/23 and she was dong well with no pain. She did complain of persistent balance issues that have gotten worse. She was  hyper reflexic on exam and had clonus.    Cervical MRI ordered and she is here to review it. She also needs to get repeat lumbar xrays.    She continues with balance issues. Occasional issues with hand dexterity.    She has intermittent mid to LBP that is only occasional. No leg pain. She has numbness and tingling in her feet. No weakness in her legs. She is not wearing her back brace.   Review of Systems:  A 10 point review of systems is negative, except for the pertinent positives and negatives detailed in the HPI.  Past Medical History: Past Medical History:  Diagnosis Date   Breast cancer (HCC) 11/2014   left   Cancer (HCC) 2016   left breast   Depression    Dysrhythmia    heart racing   Family history of adverse reaction to anesthesia    brother had difficulty going under anesthseia   Headache    hx of migraines   period induced   Hypertension    Hypothyroidism    Memory changes    Osteoarthritis    Parkinson disease (HCC)    Personal history of radiation therapy     Past Surgical History: Past Surgical History:  Procedure Laterality Date   BREAST BIOPSY Left 10/2014   positive   BREAST LUMPECTOMY Left 2016   invasive mammary   CATARACT EXTRACTION W/PHACO Right 11/27/2021   Procedure: CATARACT EXTRACTION PHACO AND INTRAOCULAR LENS PLACEMENT (IOC) RIGHT 6.56 00:47.9;  Surgeon: Enola Feliciano Hugger, MD;  Location: [facility];  Service: Ophthalmology;  Laterality:  Right;   CATARACT EXTRACTION W/PHACO Left 12/11/2021   Procedure: CATARACT EXTRACTION PHACO AND INTRAOCULAR LENS PLACEMENT (IOC) LEFT 3.47 00:29.2;  Surgeon: Enola Feliciano Hugger, MD;  Location: [facility];  Service: Ophthalmology;  Laterality: Left;   COLONOSCOPY Left 07/17/2018   Procedure: COLONOSCOPY;  Surgeon: Janalyn Keene NOVAK, MD;  Location: [facility] ENDOSCOPY;  Service: Endoscopy;  Laterality: Left;   DILATION AND CURETTAGE OF UTERUS     ESOPHAGOGASTRODUODENOSCOPY Left 07/17/2018    Procedure: ESOPHAGOGASTRODUODENOSCOPY (EGD);  Surgeon: Janalyn Keene NOVAK, MD;  Location: [facility] ENDOSCOPY;  Service: Endoscopy;  Laterality: Left;   Laminectomy Posterior Cervicle Decomp W/Facetectomy & Foraminotomy     [id number] x 3...SABRASABRAEXCISION CENTRAL LEFT DISC HERNIATION L 2-3; Surgeon: Debby Lorrene Kaufmann, MD; Location: [facility] OR; Service: Orthopedics; Laterality: Left   Laminectomy Posterior Lumbar Facetectomy & Foraminotomy W/Decomp     BILATERAL LUMBAR DECOMPRESSION L2-3, L3-4, L4-5; Surgeon: Debby Lorrene Kaufmann, MD; Location: [facility] OR; Service: Orthopedics; Laterality: Bilateral;   PARTIAL MASTECTOMY WITH NEEDLE LOCALIZATION Left 11/29/2014   Procedure: PARTIAL MASTECTOMY WITH NEEDLE LOCALIZATION;  Surgeon: Larinda Unknown Sharps, MD;  Location: [facility] ORS;  Service: General;  Laterality: Left;   SENTINEL NODE BIOPSY Left 11/29/2014   Procedure: SENTINEL NODE BIOPSY;  Surgeon: Larinda Unknown Sharps, MD;  Location: [facility] ORS;  Service: General;  Laterality: Left;    Allergies: Allergies as of 12/14/2023   (No Known Allergies)    Medications:  Current Outpatient Medications:    acetaminophen  (TYLENOL ) 325 MG tablet, Take 650 mg by mouth every 4 (four) hours as needed., Disp: , Rfl:    carbidopa -levodopa  (SINEMET  IR) 25-100 MG tablet, Take 2 tablets by mouth 4 (four) times daily., Disp: , Rfl:    citalopram  (CELEXA ) 20 MG tablet, Take 30 mg by mouth daily., Disp: , Rfl:    Cyanocobalamin  (RA VITAMIN B-12 TR) 1000 MCG TBCR, Take 1,000 mcg by mouth daily. , Disp: , Rfl:    fexofenadine (ALLEGRA) 180 MG tablet, Take 180 mg by mouth daily., Disp: , Rfl:    levothyroxine  (SYNTHROID ) 75 MCG tablet, Take 75 mcg by mouth daily before breakfast., Disp: , Rfl:    omeprazole (PRILOSEC) 20 MG capsule, Take 20 mg by mouth daily., Disp: , Rfl:    polyethylene glycol (MIRALAX  / GLYCOLAX ) 17 g packet, Take 17 g by mouth 2 (two) times daily as needed., Disp: , Rfl:    potassium chloride  (K-DUR) 10 MEQ tablet, Take  10 mEq by mouth 2 (two) times daily. , Disp: , Rfl:    triamterene -hydrochlorothiazide  (MAXZIDE -25) 37.5-25 MG tablet, Take 2 tablets by mouth daily. , Disp: , Rfl:   Social History: Social History   Tobacco Use   Smoking status: Former    Current packs/day: 0.00    Types: Cigarettes    Quit date: 11/22/1999    Years since quitting: 24.0   Smokeless tobacco: Never  Vaping Use   Vaping status: Never Used  Substance Use Topics   Alcohol use: Not Currently    Comment: Rare   Drug use: No    Family Medical History: Family History  Problem Relation Age of Onset   Breast cancer Maternal Aunt    Breast cancer Paternal Aunt    Coronary artery disease Mother    Heart attack Mother    Coronary artery disease Paternal Grandmother     Physical Examination: Vitals:   12/14/23 1304  BP: 118/80    General: Patient is in no apparent distress. Attention to examination is appropriate.  Neck:  Supple.  Full range of motion.  Respiratory: Patient is breathing without any difficulty.   NEUROLOGICAL:     Awake, alert, oriented to person, place, and time.  Speech is clear and fluent.   Cranial Nerves: Pupils equal round and reactive to light.  Facial tone is symmetric.  Facial sensation is symmetric. Shoulder shrug is symmetric. Tongue protrusion is midline.    Strength: Side Biceps Triceps Deltoid Interossei Grip Wrist Ext. Wrist Flex.  R 5 5 5 5  4+ 5 5  L 5 5 5 5  4+ 5 5   Side Iliopsoas Quads Hamstring PF DF EHL  R 5 5 5 5 5 5   L 5 5 5 5 5 5    Reflexes are 3+ and symmetric at the biceps, triceps, brachioradialis, patella and achilles.   Hoffman's is absent.   Bilateral upper and lower extremity sensation is intact to light touch.    No evidence of dysmetria noted.  Gait is abnormal-she is off balance.     Medical Decision Making  Imaging: MRI C spine 11/17/2023 IMPRESSION: 1. Compared with previous MRI from 08/22/2019, there is progressive spondylosis at C2-3 with  resulting moderate to severe spinal stenosis and mild cord flattening. No abnormal cord signal. 2. Progressive multilevel facet hypertrophy with new interfacetal ankylosis on the right at C3-4. 3. Otherwise similar multilevel cervical spondylosis, most advanced at C5-6 where there is mild to moderate spinal stenosis and moderate foraminal narrowing bilaterally. 4. No acute osseous findings. No evidence of metastatic disease.     Electronically Signed   By: Elsie Perone M.D.   On: 11/21/2023 09:27  I have personally reviewed the images and agree with the above interpretation.  Assessment and Plan: Kathy Bean is a pleasant 73 y.o. 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  She has moderate to severe stenosis and has symptoms of cervical spondylotic myelopathy.  There is no role for conservative management of cervical spondylotic myelopathy.  I recommended C2-3 laminectomy.  I discussed the planned procedure at length with the patient, including the risks, benefits, alternatives, and indications. The risks discussed include but are not limited to bleeding, infection, need for reoperation, spinal fluid leak, stroke, vision loss, anesthetic complication, coma, paralysis, and even death. I also described in detail that improvement was not guaranteed.  The patient expressed understanding of these risks, and asked that we proceed with surgery. I described the surgery in layman's terms, and gave ample opportunity for questions, which were answered to the best of my ability.  She has an 8 mm L4 and L5 spondylolisthesis with facet arthrosis at L4-5 as well.  We will readdress this after her neck is addressed.   I spent a total of 30 minutes in this patient's care today. This time was spent reviewing pertinent records including imaging studies, obtaining and confirming history, performing a directed evaluation, formulating and discussing my recommendations, and documenting  the visit within the medical record.        Thank you for involving me in the care of this patient.      Dawayne Ohair K. Clois MD, [facility]

## 2023-12-08 NOTE — Progress Notes (Signed)
 Referring Physician:  Cleotilde Oneil FALCON, MD [address] [facility] [place],  [place] [address]  Primary Physician:  Cleotilde Oneil FALCON, MD  History of Present Illness: 12/14/2023 Kathy Bean is here today with a chief complaint of balance issues and dexterity changes.  She experiences balance issues, feeling unbalanced in every direction, but has not had any recent falls. A fall three months ago resulted in a T12 fracture, which is not painful. She has difficulty with fine motor tasks, such as buttoning clothes, and occasionally drops objects. She uses a walker for mobility at home. Sinemet  provides symptom relief for Parkinson's disease. She underwent major lower back surgery in 2015 with no subsequent lower back pain. There is no numbness or tingling in her arms, no issues with grip strength, and no numbness in her hands.  She is also having some back pain.  She previously had surgery approximately 10 years ago.  Discussed the use of AI scribe software for clinical note transcription with the patient, who gave verbal consent to proceed.   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  The symptoms are causing a significant impact on the patient's life.   I have utilized the care everywhere function in epic to review the outside records available from external health systems.  Progress Note from Glade Boys, [place] on 11/25/23:  History of Present Illness: Kathy Bean has a history of HTN, hypothyroidism, parkinson's disease, GERD, anxiety, depression, breast CA, chronic anemia, peripheral neuropathy, chronic ETOH use, hydrocephalus.    Seen by Dr. Clois as hospital consult on 10/13/23 for a T12 compression fracture. She was to wear TLSO brace. He discussed kyphoplasty with her and she declined.    Last seen by me on 11/11/23 and she was dong well with no pain. She did complain of persistent balance issues that have gotten worse. She was  hyper reflexic on exam and had clonus.    Cervical MRI ordered and she is here to review it. She also needs to get repeat lumbar xrays.    She continues with balance issues. Occasional issues with hand dexterity.    She has intermittent mid to LBP that is only occasional. No leg pain. She has numbness and tingling in her feet. No weakness in her legs. She is not wearing her back brace.   Review of Systems:  A 10 point review of systems is negative, except for the pertinent positives and negatives detailed in the HPI.  Past Medical History: Past Medical History:  Diagnosis Date   Breast cancer (HCC) 11/2014   left   Cancer (HCC) 2016   left breast   Depression    Dysrhythmia    heart racing   Family history of adverse reaction to anesthesia    brother had difficulty going under anesthseia   Headache    hx of migraines   period induced   Hypertension    Hypothyroidism    Memory changes    Osteoarthritis    Parkinson disease (HCC)    Personal history of radiation therapy     Past Surgical History: Past Surgical History:  Procedure Laterality Date   BREAST BIOPSY Left 10/2014   positive   BREAST LUMPECTOMY Left 2016   invasive mammary   CATARACT EXTRACTION W/PHACO Right 11/27/2021   Procedure: CATARACT EXTRACTION PHACO AND INTRAOCULAR LENS PLACEMENT (IOC) RIGHT 6.56 00:47.9;  Surgeon: Enola Feliciano Hugger, MD;  Location: [facility];  Service: Ophthalmology;  Laterality:  Right;   CATARACT EXTRACTION W/PHACO Left 12/11/2021   Procedure: CATARACT EXTRACTION PHACO AND INTRAOCULAR LENS PLACEMENT (IOC) LEFT 3.47 00:29.2;  Surgeon: Enola Feliciano Hugger, MD;  Location: [facility];  Service: Ophthalmology;  Laterality: Left;   COLONOSCOPY Left 07/17/2018   Procedure: COLONOSCOPY;  Surgeon: Janalyn Keene NOVAK, MD;  Location: [facility] ENDOSCOPY;  Service: Endoscopy;  Laterality: Left;   DILATION AND CURETTAGE OF UTERUS     ESOPHAGOGASTRODUODENOSCOPY Left 07/17/2018    Procedure: ESOPHAGOGASTRODUODENOSCOPY (EGD);  Surgeon: Janalyn Keene NOVAK, MD;  Location: [facility] ENDOSCOPY;  Service: Endoscopy;  Laterality: Left;   Laminectomy Posterior Cervicle Decomp W/Facetectomy & Foraminotomy     [id number] x 3...SABRASABRAEXCISION CENTRAL LEFT DISC HERNIATION L 2-3; Surgeon: Debby Lorrene Kaufmann, MD; Location: [facility] OR; Service: Orthopedics; Laterality: Left   Laminectomy Posterior Lumbar Facetectomy & Foraminotomy W/Decomp     BILATERAL LUMBAR DECOMPRESSION L2-3, L3-4, L4-5; Surgeon: Debby Lorrene Kaufmann, MD; Location: [facility] OR; Service: Orthopedics; Laterality: Bilateral;   PARTIAL MASTECTOMY WITH NEEDLE LOCALIZATION Left 11/29/2014   Procedure: PARTIAL MASTECTOMY WITH NEEDLE LOCALIZATION;  Surgeon: Larinda Unknown Sharps, MD;  Location: [facility] ORS;  Service: General;  Laterality: Left;   SENTINEL NODE BIOPSY Left 11/29/2014   Procedure: SENTINEL NODE BIOPSY;  Surgeon: Larinda Unknown Sharps, MD;  Location: [facility] ORS;  Service: General;  Laterality: Left;    Allergies: Allergies as of 12/14/2023   (No Known Allergies)    Medications:  Current Outpatient Medications:    acetaminophen  (TYLENOL ) 325 MG tablet, Take 650 mg by mouth every 4 (four) hours as needed., Disp: , Rfl:    carbidopa -levodopa  (SINEMET  IR) 25-100 MG tablet, Take 2 tablets by mouth 4 (four) times daily., Disp: , Rfl:    citalopram  (CELEXA ) 20 MG tablet, Take 30 mg by mouth daily., Disp: , Rfl:    Cyanocobalamin  (RA VITAMIN B-12 TR) 1000 MCG TBCR, Take 1,000 mcg by mouth daily. , Disp: , Rfl:    fexofenadine (ALLEGRA) 180 MG tablet, Take 180 mg by mouth daily., Disp: , Rfl:    levothyroxine  (SYNTHROID ) 75 MCG tablet, Take 75 mcg by mouth daily before breakfast., Disp: , Rfl:    omeprazole (PRILOSEC) 20 MG capsule, Take 20 mg by mouth daily., Disp: , Rfl:    polyethylene glycol (MIRALAX  / GLYCOLAX ) 17 g packet, Take 17 g by mouth 2 (two) times daily as needed., Disp: , Rfl:    potassium chloride  (K-DUR) 10 MEQ tablet, Take  10 mEq by mouth 2 (two) times daily. , Disp: , Rfl:    triamterene -hydrochlorothiazide  (MAXZIDE -25) 37.5-25 MG tablet, Take 2 tablets by mouth daily. , Disp: , Rfl:   Social History: Social History   Tobacco Use   Smoking status: Former    Current packs/day: 0.00    Types: Cigarettes    Quit date: 11/22/1999    Years since quitting: 24.0   Smokeless tobacco: Never  Vaping Use   Vaping status: Never Used  Substance Use Topics   Alcohol use: Not Currently    Comment: Rare   Drug use: No    Family Medical History: Family History  Problem Relation Age of Onset   Breast cancer Maternal Aunt    Breast cancer Paternal Aunt    Coronary artery disease Mother    Heart attack Mother    Coronary artery disease Paternal Grandmother     Physical Examination: Vitals:   12/14/23 1304  BP: 118/80    General: Patient is in no apparent distress. Attention to examination is appropriate.  Neck:  Supple.  Full range of motion.  Respiratory: Patient is breathing without any difficulty.   NEUROLOGICAL:     Awake, alert, oriented to person, place, and time.  Speech is clear and fluent.   Cranial Nerves: Pupils equal round and reactive to light.  Facial tone is symmetric.  Facial sensation is symmetric. Shoulder shrug is symmetric. Tongue protrusion is midline.    Strength: Side Biceps Triceps Deltoid Interossei Grip Wrist Ext. Wrist Flex.  R 5 5 5 5  4+ 5 5  L 5 5 5 5  4+ 5 5   Side Iliopsoas Quads Hamstring PF DF EHL  R 5 5 5 5 5 5   L 5 5 5 5 5 5    Reflexes are 3+ and symmetric at the biceps, triceps, brachioradialis, patella and achilles.   Hoffman's is absent.   Bilateral upper and lower extremity sensation is intact to light touch.    No evidence of dysmetria noted.  Gait is abnormal-she is off balance.     Medical Decision Making  Imaging: MRI C spine 11/17/2023 IMPRESSION: 1. Compared with previous MRI from 08/22/2019, there is progressive spondylosis at C2-3 with  resulting moderate to severe spinal stenosis and mild cord flattening. No abnormal cord signal. 2. Progressive multilevel facet hypertrophy with new interfacetal ankylosis on the right at C3-4. 3. Otherwise similar multilevel cervical spondylosis, most advanced at C5-6 where there is mild to moderate spinal stenosis and moderate foraminal narrowing bilaterally. 4. No acute osseous findings. No evidence of metastatic disease.     Electronically Signed   By: Elsie Perone M.D.   On: 11/21/2023 09:27  I have personally reviewed the images and agree with the above interpretation.  Assessment and Plan: Kathy Bean is a pleasant 73 y.o. 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  She has moderate to severe stenosis and has symptoms of cervical spondylotic myelopathy.  There is no role for conservative management of cervical spondylotic myelopathy.  I recommended C2-3 laminectomy.  I discussed the planned procedure at length with the patient, including the risks, benefits, alternatives, and indications. The risks discussed include but are not limited to bleeding, infection, need for reoperation, spinal fluid leak, stroke, vision loss, anesthetic complication, coma, paralysis, and even death. I also described in detail that improvement was not guaranteed.  The patient expressed understanding of these risks, and asked that we proceed with surgery. I described the surgery in layman's terms, and gave ample opportunity for questions, which were answered to the best of my ability.  She has an 8 mm L4 and L5 spondylolisthesis with facet arthrosis at L4-5 as well.  We will readdress this after her neck is addressed.   I spent a total of 30 minutes in this patient's care today. This time was spent reviewing pertinent records including imaging studies, obtaining and confirming history, performing a directed evaluation, formulating and discussing my recommendations, and documenting  the visit within the medical record.        Thank you for involving me in the care of this patient.      Dawayne Ohair K. Clois MD, [facility]

## 2023-12-13 ENCOUNTER — Ambulatory Visit: Admitting: Orthopedic Surgery

## 2023-12-14 ENCOUNTER — Ambulatory Visit (INDEPENDENT_AMBULATORY_CARE_PROVIDER_SITE_OTHER): Admitting: Neurosurgery

## 2023-12-14 ENCOUNTER — Other Ambulatory Visit: Payer: Self-pay

## 2023-12-14 ENCOUNTER — Encounter: Payer: Self-pay | Admitting: Neurosurgery

## 2023-12-14 VITALS — BP 118/80

## 2023-12-14 DIAGNOSIS — Z01818 Encounter for other preprocedural examination: Secondary | ICD-10-CM

## 2023-12-14 DIAGNOSIS — G959 Disease of spinal cord, unspecified: Secondary | ICD-10-CM

## 2023-12-14 DIAGNOSIS — M4316 Spondylolisthesis, lumbar region: Secondary | ICD-10-CM

## 2023-12-14 DIAGNOSIS — M4802 Spinal stenosis, cervical region: Secondary | ICD-10-CM

## 2023-12-14 NOTE — Addendum Note (Signed)
 Addended by: Deland Slocumb E on: 12/14/2023 02:14 PM   Modules accepted: Orders

## 2023-12-14 NOTE — Patient Instructions (Signed)
 Please see below for information in regards to your upcoming surgery:   Planned surgery: C2-3 Laminectomy    Surgery date: 01/10/2024 at [facility]: [address], [place], [place] [address]) - you will find out your arrival time the business day before your surgery.    Pre-op appointment at [facility] Pre-admit Testing: you will receive a call with a date/time for this appointment. If you are scheduled for an in person appointment, Pre-admit Testing is located on the first floor of the [facility] building, [address], [address]. During this appointment, they will advise you which medications you can take the morning of surgery, and which medications you will need to hold for surgery. Labs (such as blood work, EKG) may be done at your pre-op appointment. You are not required to fast for these labs. Should you need to change your pre-op appointment, please call Pre-admit testing at [phone].       Blood thinners:   Aspirin 81mg :  stop aspirin 7 days prior, resume aspirin 14 days after      Surgical clearance: we will send a clearance form to Oneil Pinal, MD. They may wish to see you in their office prior to signing the clearance form. If so, they may call you to schedule an appointment.      Common restrictions after spine surgery: No bending, lifting, or twisting ("BLT"). Avoid lifting objects heavier than 10 pounds for the first 6 weeks after surgery. Where possible, avoid household activities that involve lifting, bending, reaching, pushing, or pulling such as laundry, vacuuming, grocery shopping, and childcare. Try to arrange for help from friends and family for these activities while you heal. Do not drive while taking prescription pain medication. Weeks 6 through 12 after surgery: avoid lifting more than 25 pounds.      How to contact us :  If you have any questions/concerns before or after surgery, you can reach  us  at [phone], or you can send a mychart message. We can be reached by phone or mychart 8am-4pm, Monday-Friday.  *Please note: Calls after 4pm are forwarded to a third party answering service. Mychart messages are not routinely monitored during evenings, weekends, and holidays. Please call our office to contact the answering service for urgent concerns during non-business hours.     If you have FMLA/disability paperwork, please drop it off or fax it to [phone]   Appointments/FMLA & disability paperwork: Reche Hait, & Nichole Registered Nurse/Surgery scheduler: Kendelyn, RN & Katie, RN Certified Medical Assistants: Don, CMA, Elenor, CMA, Damien, CMA, & Auston, [place] Physician Assistants: Lyle Decamp, PA-C, Edsel Goods, PA-C & Glade Boys, PA-C Surgeons: Penne Sharps, MD & Reeves Daisy, MD    [facility] PREADMIT TESTING VISIT and SURGERY INFORMATION SHEET   Now that surgery has been scheduled you can anticipate several phone calls from [facility] services. A pharmacy technician will call you to verify your current list of medications taken at home.               The Pre-Service Center will call to verify your insurance information and to give you billing estimates and information.             The Preadmit Testing Office will be calling to schedule a visit to obtain information for the anesthesia team and provide instructions on preparation for surgery.  What can you expect for the Preadmit Testing Visit: Appointments may be scheduled in-person or by telephone.  If a telephone visit is scheduled, you may be asked to come into the office to have lab tests or other studies performed.   This visit will not be completed any greater than 14 days prior to your surgery.  If your surgery has been scheduled for a future date, please do not be alarmed if we have not contacted you to schedule an appointment more than a month prior to the surgery date.     Please be prepared to provide the following information during this appointment:            -Personal medical history                                               -Medication and allergy list            -Any history of problems with anesthesia              -Recent lab work or diagnostic studies            -Please notify us  of any needs we should be aware of to provide the best care possible           -You will be provided with instructions on how to prepare for your surgery.    On The Day of Surgery:  You must have a driver to take you home after surgery, you will be asked not to drive for 24 hours following surgery.  Taxi, Gisele and non-medical transport will not be acceptable means of transportation unless you have a responsible individual who will be traveling with you.  Visitors in the surgical area:   2 people will be able to visit you in your room once your preparation for surgery has been completed. During surgery, your visitors will be asked to wait in the Surgery Waiting Area.  It is not a requirement for them to stay, if they prefer to leave and come back.  Your visitor(s) will be given an update once the surgery has been completed.  No visitors are allowed in the initial recovery room to respect patient privacy and safety.  Once you are more awake and transfer to the secondary recovery area, or are transferred to an inpatient room, visitors will again be able to see you.  To respect and protect your privacy: We will ask on the day of surgery who your driver will be and what the contact number for that individual will be. We will ask if it is okay to share information with this individual, or if there is an alternative individual that we, or the surgeon, should contact to provide updates and information. If family or friends come to the surgical information desk requesting information about you, who you have not listed with us , no information will be given.   It may be helpful to  designate someone as the main contact who will be responsible for updating your other friends and family.    PREADMIT TESTING OFFICE: [phone] SAME DAY SURGERY: [phone] We look forward to caring for you before and throughout the process of your surgery.

## 2023-12-24 ENCOUNTER — Other Ambulatory Visit: Payer: Self-pay

## 2023-12-24 DIAGNOSIS — S22080D Wedge compression fracture of T11-T12 vertebra, subsequent encounter for fracture with routine healing: Secondary | ICD-10-CM

## 2023-12-28 NOTE — Progress Notes (Unsigned)
 Referring Physician:  Cleotilde Oneil FALCON, MD [phone] [facility] [address] [facility] [place],  [place] [address]  Primary Physician:  Cleotilde Oneil FALCON, MD  History of Present Illness: Kathy Bean has a history of HTN, hypothyroidism, parkinson's disease, GERD, anxiety, depression, breast CA, chronic anemia, peripheral neuropathy, chronic ETOH use, hydrocephalus.   Last seen by me on 11/25/23 for follow up of T12 compression fracture seen on imaging on 10/13/23. She was to wear TLSO brace.   She saw Dr. Clois on 12/14/23 for cervical stenosis and myelopathic symptoms. She is scheduled for C2-C3 laminectomy on 01/10/24  She is here for follow up of her T12 fracture.   She has no complaints of mid or low back pain. She is not wearing her TLSO. She has numbness and tingling in her feet. No weakness in her legs.   She is getting over bronchitis- seeing her PCP for this.    Review of Systems:  A 10 point review of systems is negative, except for the pertinent positives and negatives detailed in the HPI.  Past Medical History: Past Medical History:  Diagnosis Date   Breast cancer (HCC) 11/2014   left   Cancer (HCC) 2016   left breast   Depression    Dysrhythmia    heart racing   Family history of adverse reaction to anesthesia    brother had difficulty going under anesthseia   Headache    hx of migraines   period induced   Hypertension    Hypothyroidism    Memory changes    Osteoarthritis    Parkinson disease (HCC)    Personal history of radiation therapy     Past Surgical History: Past Surgical History:  Procedure Laterality Date   BREAST BIOPSY Left 10/2014   positive   BREAST LUMPECTOMY Left 2016   invasive mammary   CATARACT EXTRACTION W/PHACO Right 11/27/2021   Procedure: CATARACT EXTRACTION PHACO AND INTRAOCULAR LENS PLACEMENT (IOC) RIGHT 6.56 00:47.9;  Surgeon: Enola Feliciano Hugger, MD;  Location: [facility];  Service: Ophthalmology;   Laterality: Right;   CATARACT EXTRACTION W/PHACO Left 12/11/2021   Procedure: CATARACT EXTRACTION PHACO AND INTRAOCULAR LENS PLACEMENT (IOC) LEFT 3.47 00:29.2;  Surgeon: Enola Feliciano Hugger, MD;  Location: [facility];  Service: Ophthalmology;  Laterality: Left;   COLONOSCOPY Left 07/17/2018   Procedure: COLONOSCOPY;  Surgeon: Janalyn Keene NOVAK, MD;  Location: [facility] END[facility]COPY;  Service: Endoscopy;  Laterality: Left;   DILATION AND CURETTAGE OF UTERUS     ESOPHAGOGASTRODUODENOSCOPY Left 07/17/2018   Procedure: ESOPHAGOGASTRODUODENOSCOPY (EGD);  Surgeon: Janalyn Keene NOVAK, MD;  Location: [facility] END[facility]COPY;  Service: Endoscopy;  Laterality: Left;   Laminectomy Posterior Cervicle Decomp W/Facetectomy & Foraminotomy     [id number] x 3...SABRASABRAEXCISION CENTRAL LEFT DISC HERNIATION L 2-3; Surgeon: Debby Lorrene Kaufmann, MD; Location: [facility] OR; Service: Orthopedics; Laterality: Left   Laminectomy Posterior Lumbar Facetectomy & Foraminotomy W/Decomp     BILATERAL LUMBAR DECOMPRESSION L2-3, L3-4, L4-5; Surgeon: Debby Lorrene Kaufmann, MD; Location: [facility] OR; Service: Orthopedics; Laterality: Bilateral;   PARTIAL MASTECTOMY WITH NEEDLE LOCALIZATION Left 11/29/2014   Procedure: PARTIAL MASTECTOMY WITH NEEDLE LOCALIZATION;  Surgeon: Larinda Unknown Sharps, MD;  Location: [facility] ORS;  Service: General;  Laterality: Left;   SENTINEL NODE BIOPSY Left 11/29/2014   Procedure: SENTINEL NODE BIOPSY;  Surgeon: Larinda Unknown Sharps, MD;  Location: [facility] ORS;  Service: General;  Laterality: Left;    Allergies: Allergies as of 12/29/2023   (No Known Allergies)    Medications:  Outpatient Encounter Medications as of 12/29/2023  Medication Sig   acetaminophen  (TYLENOL ) 325 MG tablet Take 650 mg by mouth every 4 (four) hours as needed.   aspirin EC 81 MG tablet Take 81 mg by mouth daily. Swallow whole.   azithromycin (ZITHROMAX) 250 MG tablet Take 2 tablets (500mg ) by mouth on Day 1. Take 1 tablet (250mg ) by mouth on Days 2-5.    carbidopa -levodopa  (SINEMET  IR) 25-100 MG tablet Take 2 tablets by mouth 4 (four) times daily.   citalopram  (CELEXA ) 20 MG tablet Take 30 mg by mouth daily.   Cyanocobalamin  (RA VITAMIN B-12 TR) 1000 MCG TBCR Take 1,000 mcg by mouth daily.    fexofenadine (ALLEGRA) 180 MG tablet Take 180 mg by mouth daily.   levothyroxine  (SYNTHROID ) 75 MCG tablet Take 75 mcg by mouth daily before breakfast.   omeprazole (PRILOSEC) 20 MG capsule Take 20 mg by mouth daily.   polyethylene glycol (MIRALAX  / GLYCOLAX ) 17 g packet Take 17 g by mouth 2 (two) times daily as needed.   potassium chloride  (K-DUR) 10 MEQ tablet Take 10 mEq by mouth 2 (two) times daily.    predniSONE (DELTASONE) 20 MG tablet Take 20 mg by mouth.   triamterene -hydrochlorothiazide  (MAXZIDE -25) 37.5-25 MG tablet Take 2 tablets by mouth daily.    No facility-administered encounter medications on file as of 12/29/2023.    Social History: Social History   Tobacco Use   Smoking status: Former    Current packs/day: 0.00    Types: Cigarettes    Quit date: 11/22/1999    Years since quitting: 24.1   Smokeless tobacco: Never  Vaping Use   Vaping status: Never Used  Substance Use Topics   Alcohol use: Not Currently    Comment: Rare   Drug use: No    Family Medical History: Family History  Problem Relation Age of Onset   Breast cancer Maternal Aunt    Breast cancer Paternal Aunt    Coronary artery disease Mother    Heart attack Mother    Coronary artery disease Paternal Grandmother     Physical Examination: Vitals:   12/29/23 1153  BP: 116/84    Awake, alert, oriented to person, place, and time.  Speech is clear and fluent. Fund of knowledge is appropriate.   Cranial Nerves: Pupils equal round and reactive to light.  Facial tone is symmetric.    No tenderness over T12. Minimal diffuse lower lumbar tenderness noted.   No abnormal lesions on exposed skin.   Strength: Side Iliopsoas Quads Hamstring PF DF EHL  R 5 5 5 5 5  5   L 5 5 5 5 5 5    Reflexes are 3+ and symmetric at the patella and achilles.     Bilateral lower extremity sensation is intact to light touch.     She is in [facility] today.   Medical Decision Making  Imaging: Lumbar xrays dated 12/29/23:  T12 compression fracture that appears stable, slip L4-L5 with diffuse DDD. No increased kyphosis noted above T12 fracture.   Report for above xrays are not yet available.   Assessment and Plan: Ms. Allport has known T12 compression fracture that was seen on imaging on 10/13/23.   She has no complaints of mid or low back pain. She is not wearing her TLSO. She has numbness and tingling in her feet. No weakness in her legs.   Lumbar xrays show stable T12 fracture, no increased kyphosis above fracture.  Clinically, she looks good and has no pain.  Treatment options discussed with patient and following plan made:   - I would avoid bending, twisting, or lifting for another 4 weeks. She does not need to wear TLSO.  - She can follow up prn regarding T12 fracture.  - As above, she is scheduled for cervical surgery on 01/10/24 and we will follow her postop.   I spent a total of 15 minutes in face-to-face and non-face-to-face activities related to this patient's care today including review of outside records, review of imaging, review of symptoms, physical exam, discussion of differential diagnosis, discussion of treatment options, and documentation.   Glade Boys PA-C Dept. of Neurosurgery

## 2023-12-29 ENCOUNTER — Ambulatory Visit (INDEPENDENT_AMBULATORY_CARE_PROVIDER_SITE_OTHER): Admitting: Orthopedic Surgery

## 2023-12-29 ENCOUNTER — Encounter: Payer: Self-pay | Admitting: Orthopedic Surgery

## 2023-12-29 ENCOUNTER — Ambulatory Visit (INDEPENDENT_AMBULATORY_CARE_PROVIDER_SITE_OTHER)

## 2023-12-29 VITALS — BP 116/84 | Ht 67.0 in | Wt 188.0 lb

## 2023-12-29 DIAGNOSIS — S22080D Wedge compression fracture of T11-T12 vertebra, subsequent encounter for fracture with routine healing: Secondary | ICD-10-CM

## 2024-01-04 ENCOUNTER — Other Ambulatory Visit: Payer: Self-pay

## 2024-01-04 ENCOUNTER — Encounter
Admission: RE | Admit: 2024-01-04 | Discharge: 2024-01-04 | Disposition: A | Discharge status: Home / Self Care | Source: Ambulatory Visit | Attending: Neurosurgery | Admitting: Neurosurgery

## 2024-01-04 VITALS — BP 119/94 | HR 102 | Resp 16

## 2024-01-04 DIAGNOSIS — R2689 Other abnormalities of gait and mobility: Secondary | ICD-10-CM

## 2024-01-04 DIAGNOSIS — Z01812 Encounter for preprocedural laboratory examination: Secondary | ICD-10-CM | POA: Diagnosis present

## 2024-01-04 DIAGNOSIS — I10 Essential (primary) hypertension: Secondary | ICD-10-CM | POA: Insufficient documentation

## 2024-01-04 DIAGNOSIS — D649 Anemia, unspecified: Secondary | ICD-10-CM | POA: Diagnosis not present

## 2024-01-04 LAB — BASIC METABOLIC PANEL WITH GFR
Anion gap: 12 (ref 5–15)
BUN: 24 mg/dL — ABNORMAL HIGH (ref 8–23)
CO2: 23 mmol/L (ref 22–32)
Calcium: 9.5 mg/dL (ref 8.9–10.3)
Chloride: 104 mmol/L (ref 98–111)
Creatinine, Ser: 0.86 mg/dL (ref 0.44–1.00)
GFR, Estimated: >60 mL/min (ref >60)
Glucose, Bld: 85 mg/dL (ref 70–99)
Potassium: 3.6 mmol/L (ref 3.5–5.1)
Sodium: 139 mmol/L (ref 135–145)

## 2024-01-04 LAB — CBC
HCT: 47.4 % — ABNORMAL HIGH (ref 36.0–46.0)
Hemoglobin: 15.8 g/dL — ABNORMAL HIGH (ref 12.0–15.0)
MCH: 31.7 pg (ref 26.0–34.0)
MCHC: 33.3 g/dL (ref 30.0–36.0)
MCV: 95.2 fL (ref 80.0–100.0)
Platelets: 262 K/uL (ref 150–400)
RBC: 4.98 MIL/uL (ref 3.87–5.11)
RDW: 13.7 % (ref 11.5–15.5)
WBC: 12.5 K/uL — ABNORMAL HIGH (ref 4.0–10.5)
nRBC: 0 % (ref 0.0–0.2)

## 2024-01-04 LAB — SURGICAL PCR SCREEN
MRSA, PCR: NEGATIVE
Staphylococcus aureus: NEGATIVE

## 2024-01-04 LAB — TYPE AND SCREEN
ABO/RH(D): O POS
Antibody Screen: NEGATIVE

## 2024-01-04 NOTE — Patient Instructions (Addendum)
 Your procedure is scheduled on: 01/10/24 - Monday Report to the Registration Desk on the 1st floor of the [facility]. To find out your arrival time, please call [phone] between 1PM - 3PM on: 01/07/24 - Friday If your arrival time is 6:00 am, do not arrive before that time as the [facility] entrance doors do not open until 6:00 am.  REMEMBER: Instructions that are not followed completely may result in serious medical risk, up to and including death; or upon the discretion of your surgeon and anesthesiologist your surgery may need to be rescheduled.  Do not eat food after midnight the night before surgery.  No gum chewing or hard candies.  You may however, drink CLEAR liquids up to 2 hours before you are scheduled to arrive for your surgery. Do not drink anything within 2 hours of your scheduled arrival time.  Clear liquids include: - water  - apple juice without pulp - gatorade (not RED colors) - black coffee or tea (Do NOT add milk or creamers to the coffee or tea) Do NOT drink anything that is not on this list.  You may continue if needed, Anti-inflammatories (NSAIDS) such as Advil, Aleve, Ibuprofen, Motrin, Naproxen, Naprosyn and Aspirin based products such as Excedrin, Goody's Powder, BC Powder. You may continue to take Tylenol  if needed for pain up until the day of surgery.  Stop ANY OVER THE COUNTER supplements until after surgery. you MAY CONTINUE vitamin B12.  HOLD triamterene -hydrochlorothiazide  ON THE DAY OF SURGERY.  We have instructed the patient to hold their blood thinner for surgery as listed below:  Aspirin 81mg :  stop aspirin 7 days prior, resume aspirin 14 days after   ON THE DAY OF SURGERY ONLY TAKE THESE MEDICATIONS WITH SIPS OF WATER:  carbidopa -levodopa  (SINEMET  IR)  levothyroxine  (SYNTHROID )    No Alcohol for 24 hours before or after surgery.  No Smoking including e-cigarettes for 24 hours before surgery.  No chewable tobacco products for at  least 6 hours before surgery.  No nicotine patches on the day of surgery.  Do not use any recreational drugs for at least a week (preferably 2 weeks) before your surgery.  Please be advised that the combination of cocaine and anesthesia may have negative outcomes, up to and including death. If you test positive for cocaine, your surgery will be cancelled.  On the morning of surgery brush your teeth with toothpaste and water, you may rinse your mouth with mouthwash if you wish. Do not swallow any toothpaste or mouthwash.  Use CHG Soap or wipes as directed on instruction sheet.  Do not wear jewelry, make-up, hairpins, clips or nail polish.  For welded (permanent) jewelry: bracelets, anklets, waist bands, etc.  Please have this removed prior to surgery.  If it is not removed, there is a chance that hospital personnel will need to cut it off on the day of surgery.  Do not wear lotions, powders, or perfumes.   Do not shave body hair from the neck down 48 hours before surgery.  Contact lenses, hearing aids and dentures may not be worn into surgery.  Do not bring valuables to the hospital. [facility] is not responsible for any missing/lost belongings or valuables.   Notify your doctor if there is any change in your medical condition (cold, fever, infection).  Wear comfortable clothing (specific to your surgery type) to the hospital.  After surgery, you can help prevent lung complications by doing breathing exercises.  Take deep breaths and cough  every 1-2 hours. Your doctor may order a device called an Incentive Spirometer to help you take deep breaths.  If you are being admitted to the hospital overnight, leave your suitcase in the car. After surgery it may be brought to your room.  In case of increased patient census, it may be necessary for you, the patient, to continue your postoperative care in the Same Day Surgery department.  If you are being discharged the day of surgery, you  will not be allowed to drive home. You will need a responsible individual to drive you home and stay with you for 24 hours after surgery.   If you are taking public transportation, you will need to have a responsible individual with you.  Please call the Pre-admissions Testing Dept. at [phone] if you have any questions about these instructions.  Surgery Visitation Policy:  Patients having surgery or a procedure may have two visitors.  Children under the age of 48 must have an adult with them who is not the patient.  Inpatient Visitation:    Visiting hours are 7 a.m. to 8 p.m. Up to four visitors are allowed at one time in a patient room. The visitors may rotate out with other people during the day.  One visitor age 76 or older may stay with the patient overnight and must be in the room by 8 p.m.   Merchandiser, Retail to address health-related social needs:  [email or web address]    Pre-operative 4 CHG Bath Instructions   You can play a key role in reducing the risk of infection after surgery. Your skin needs to be as free of germs as possible. You can reduce the number of germs on your skin by washing with CHG (chlorhexidine gluconate) soap before surgery. CHG is an antiseptic soap that kills germs and continues to kill germs even after washing.   DO NOT use if you have an allergy to chlorhexidine/CHG or antibacterial soaps. If your skin becomes reddened or irritated, stop using the CHG and notify one of our RNs at [phone].   Please shower with the CHG soap starting 4 days before surgery using the following schedule: 11/27 - 11/30.    Please keep in mind the following:  DO NOT shave, including legs and underarms, starting the day of your first shower.   You may shave your face at any point before/day of surgery.  Place clean sheets on your bed the day you start using CHG soap. Use a clean washcloth (not used since being washed) for each shower. DO  NOT sleep with pets once you start using the CHG.   CHG Shower Instructions:  If you choose to wash your hair and private area, wash first with your normal shampoo/soap.  After you use shampoo/soap, rinse your hair and body thoroughly to remove shampoo/soap residue.  Turn the water OFF and apply about 3 tablespoons (45 ml) of CHG soap to a CLEAN washcloth.  Apply CHG soap ONLY FROM YOUR NECK DOWN TO YOUR TOES (washing for 3-5 minutes)  DO NOT use CHG soap on face, private areas, open wounds, or sores.  Pay special attention to the area where your surgery is being performed.  If you are having back surgery, having someone wash your back for you may be helpful. Wait 2 minutes after CHG soap is applied, then you may rinse off the CHG soap.  Pat dry with a clean towel  Put on clean clothes/pajamas   If you choose to wear lotion,  please use ONLY the CHG-compatible lotions on the back of this paper.     Additional instructions for the day of surgery: DO NOT APPLY any lotions, deodorants, cologne, or perfumes.   Put on clean/comfortable clothes.  Brush your teeth.  Ask your nurse before applying any prescription medications to the skin.      CHG Compatible Lotions   Aveeno Moisturizing lotion  Cetaphil Moisturizing Cream  Cetaphil Moisturizing Lotion  Clairol Herbal Essence Moisturizing Lotion, Dry Skin  Clairol Herbal Essence Moisturizing Lotion, Extra Dry Skin  Clairol Herbal Essence Moisturizing Lotion, Normal Skin  Curel Age Defying Therapeutic Moisturizing Lotion with Alpha Hydroxy  Curel Extreme Care Body Lotion  Curel Soothing Hands Moisturizing Hand Lotion  Curel Therapeutic Moisturizing Cream, Fragrance-Free  Curel Therapeutic Moisturizing Lotion, Fragrance-Free  Curel Therapeutic Moisturizing Lotion, Original Formula  Eucerin Daily Replenishing Lotion  Eucerin Dry Skin Therapy Plus Alpha Hydroxy Crme  Eucerin Dry Skin Therapy Plus Alpha Hydroxy Lotion  Eucerin Original  Crme  Eucerin Original Lotion  Eucerin Plus Crme Eucerin Plus Lotion  Eucerin TriLipid Replenishing Lotion  Keri Anti-Bacterial Hand Lotion  Keri Deep Conditioning Original Lotion Dry Skin Formula Softly Scented  Keri Deep Conditioning Original Lotion, Fragrance Free Sensitive Skin Formula  Keri Lotion Fast Absorbing Fragrance Free Sensitive Skin Formula  Keri Lotion Fast Absorbing Softly Scented Dry Skin Formula  Keri Original Lotion  Keri Skin Renewal Lotion Keri Silky Smooth Lotion  Keri Silky Smooth Sensitive Skin Lotion  Nivea Body Creamy Conditioning Oil  Nivea Body Extra Enriched Lotion  Nivea Body Original Lotion  Nivea Body Sheer Moisturizing Lotion Nivea Crme  Nivea Skin Firming Lotion  NutraDerm 30 Skin Lotion  NutraDerm Skin Lotion  NutraDerm Therapeutic Skin Cream  NutraDerm Therapeutic Skin Lotion  ProShield Protective Hand Cream  Provon moisturizing lotion  How to Use an Incentive Spirometer  An incentive spirometer is a tool that measures how well you are filling your lungs with each breath. Learning to take long, deep breaths using this tool can help you keep your lungs clear and active. This may help to reverse or lessen your chance of developing breathing (pulmonary) problems, especially infection. You may be asked to use a spirometer: After a surgery. If you have a lung problem or a history of smoking. After a long period of time when you have been unable to move or be active. If the spirometer includes an indicator to show the highest number that you have reached, your health care provider or respiratory therapist will help you set a goal. Keep a log of your progress as told by your health care provider. What are the risks? Breathing too quickly may cause dizziness or cause you to pass out. Take your time so you do not get dizzy or light-headed. If you are in pain, you may need to take pain medicine before doing incentive spirometry. It is harder to take a  deep breath if you are having pain. How to use your incentive spirometer  Sit up on the edge of your bed or on a chair. Hold the incentive spirometer so that it is in an upright position. Before you use the spirometer, breathe out normally. Place the mouthpiece in your mouth. Make sure your lips are closed tightly around it. Breathe in slowly and as deeply as you can through your mouth, causing the piston or the ball to rise toward the top of the chamber. Hold your breath for 3-5 seconds, or for as long as possible. If  the spirometer includes a coach indicator, use this to guide you in breathing. Slow down your breathing if the indicator goes above the marked areas. Remove the mouthpiece from your mouth and breathe out normally. The piston or ball will return to the bottom of the chamber. Rest for a few seconds, then repeat the steps 10 or more times. Take your time and take a few normal breaths between deep breaths so that you do not get dizzy or light-headed. Do this every 1-2 hours when you are awake. If the spirometer includes a goal marker to show the highest number you have reached (best effort), use this as a goal to work toward during each repetition. After each set of 10 deep breaths, cough a few times. This will help to make sure that your lungs are clear. If you have an incision on your chest or abdomen from surgery, place a pillow or a rolled-up towel firmly against the incision when you cough. This can help to reduce pain while taking deep breaths and coughing. General tips When you are able to get out of bed: Walk around often. Continue to take deep breaths and cough in order to clear your lungs. Keep using the incentive spirometer until your health care provider says it is okay to stop using it. If you have been in the hospital, you may be told to keep using the spirometer at home. Contact a health care provider if: You are having difficulty using the spirometer. You have  trouble using the spirometer as often as instructed. Your pain medicine is not giving enough relief for you to use the spirometer as told. You have a fever. Get help right away if: You develop shortness of breath. You develop a cough with bloody mucus from the lungs. You have fluid or blood coming from an incision site after you cough. Summary An incentive spirometer is a tool that can help you learn to take long, deep breaths to keep your lungs clear and active. You may be asked to use a spirometer after a surgery, if you have a lung problem or a history of smoking, or if you have been inactive for a long period of time. Use your incentive spirometer as instructed every 1-2 hours while you are awake. If you have an incision on your chest or abdomen, place a pillow or a rolled-up towel firmly against your incision when you cough. This will help to reduce pain. Get help right away if you have shortness of breath, you cough up bloody mucus, or blood comes from your incision when you cough. This information is not intended to replace advice given to you by your health care provider. Make sure you discuss any questions you have with your health care provider. Document Revised: 04/17/2019 Document Reviewed: 04/17/2019 Elsevier Patient Education  2023 [employer].

## 2024-01-10 ENCOUNTER — Ambulatory Visit: Admitting: Anesthesiology

## 2024-01-10 ENCOUNTER — Observation Stay
Admission: RE | Admit: 2024-01-10 | Discharge: 2024-01-11 | Disposition: A | Attending: Neurosurgery | Admitting: Neurosurgery

## 2024-01-10 ENCOUNTER — Encounter: Admission: RE | Disposition: A | Payer: Self-pay | Source: Home / Self Care | Attending: Neurosurgery

## 2024-01-10 ENCOUNTER — Ambulatory Visit: Payer: Self-pay | Admitting: Urgent Care

## 2024-01-10 ENCOUNTER — Encounter: Payer: Self-pay | Admitting: Neurosurgery

## 2024-01-10 ENCOUNTER — Other Ambulatory Visit: Payer: Self-pay

## 2024-01-10 ENCOUNTER — Ambulatory Visit

## 2024-01-10 DIAGNOSIS — M4802 Spinal stenosis, cervical region: Principal | ICD-10-CM

## 2024-01-10 DIAGNOSIS — E039 Hypothyroidism, unspecified: Secondary | ICD-10-CM | POA: Insufficient documentation

## 2024-01-10 DIAGNOSIS — Z87891 Personal history of nicotine dependence: Secondary | ICD-10-CM | POA: Insufficient documentation

## 2024-01-10 DIAGNOSIS — G959 Disease of spinal cord, unspecified: Principal | ICD-10-CM

## 2024-01-10 DIAGNOSIS — I10 Essential (primary) hypertension: Secondary | ICD-10-CM | POA: Insufficient documentation

## 2024-01-10 DIAGNOSIS — Z7982 Long term (current) use of aspirin: Secondary | ICD-10-CM | POA: Insufficient documentation

## 2024-01-10 DIAGNOSIS — Z01818 Encounter for other preprocedural examination: Secondary | ICD-10-CM

## 2024-01-10 HISTORY — PX: POSTERIOR CERVICAL LAMINECTOMY: SHX2248

## 2024-01-10 LAB — ABO/RH: ABO/RH(D): O POS

## 2024-01-10 SURGERY — POSTERIOR CERVICAL LAMINECTOMY
Anesthesia: General | Site: Spine Cervical

## 2024-01-10 MED ORDER — OXYCODONE HCL 5 MG/5ML PO SOLN: 5.0000 mg | Freq: Once | ORAL | Status: AC | PRN

## 2024-01-10 MED ORDER — ONDANSETRON HCL 4 MG/2ML IJ SOLN: 4.0000 mg | Freq: Four times a day (QID) | INTRAMUSCULAR | Status: DC | PRN

## 2024-01-10 MED ORDER — FENTANYL CITRATE (PF) 100 MCG/2ML IJ SOLN
INTRAMUSCULAR | Status: DC | PRN
  Administered 2024-01-10 (×2): 50 ug via INTRAVENOUS
  Administered 2024-01-10: 25 ug via INTRAVENOUS

## 2024-01-10 MED ORDER — DEXAMETHASONE SOD PHOSPHATE PF 10 MG/ML IJ SOLN
INTRAMUSCULAR | Status: DC | PRN
  Administered 2024-01-10: 10 mg via INTRAVENOUS

## 2024-01-10 MED ORDER — PHENOL 1.4 % MT LIQD: 1.0000 | OROMUCOSAL | Status: DC | PRN

## 2024-01-10 MED ORDER — IRRISEPT - 450ML BOTTLE WITH 0.05% CHG IN STERILE WATER, USP 99.95% OPTIME
TOPICAL | Status: DC | PRN
  Administered 2024-01-10: 450 mL

## 2024-01-10 MED ORDER — ORAL CARE MOUTH RINSE: 15.0000 mL | Freq: Once | OROMUCOSAL | Status: AC

## 2024-01-10 MED ORDER — SENNA 8.6 MG PO TABS
1.0000 | ORAL_TABLET | Freq: Two times a day (BID) | ORAL | Status: DC
  Administered 2024-01-11: 8.6 mg via ORAL
  Filled 2024-01-10 (×2): qty 1

## 2024-01-10 MED ORDER — ACETAMINOPHEN 10 MG/ML IV SOLN
INTRAVENOUS | Status: DC | PRN
  Administered 2024-01-10: 1000 mg via INTRAVENOUS

## 2024-01-10 MED ORDER — SODIUM CHLORIDE 0.9% FLUSH
3.0000 mL | Freq: Two times a day (BID) | INTRAVENOUS | Status: DC
  Administered 2024-01-10 – 2024-01-11 (×2): 3 mL via INTRAVENOUS

## 2024-01-10 MED ORDER — FENTANYL CITRATE (PF) 100 MCG/2ML IJ SOLN
INTRAMUSCULAR | Status: AC
  Filled 2024-01-10: qty 2

## 2024-01-10 MED ORDER — METHOCARBAMOL 500 MG PO TABS: 500.0000 mg | ORAL_TABLET | Freq: Four times a day (QID) | ORAL | Status: DC | PRN

## 2024-01-10 MED ORDER — CARBIDOPA-LEVODOPA 25-100 MG PO TABS
2.0000 | ORAL_TABLET | Freq: Four times a day (QID) | ORAL | Status: DC
  Administered 2024-01-10 – 2024-01-11 (×4): 2 via ORAL
  Filled 2024-01-10 (×4): qty 2

## 2024-01-10 MED ORDER — SORBITOL 70 % SOLN: 30.0000 mL | Freq: Every day | Status: DC | PRN

## 2024-01-10 MED ORDER — PHENYLEPHRINE HCL-NACL 20-0.9 MG/250ML-% IV SOLN
INTRAVENOUS | Status: AC
  Filled 2024-01-10: qty 250

## 2024-01-10 MED ORDER — FENTANYL CITRATE (PF) 100 MCG/2ML IJ SOLN
25.0000 ug | INTRAMUSCULAR | Status: DC | PRN
  Administered 2024-01-10: 50 ug via INTRAVENOUS
  Administered 2024-01-10 (×2): 25 ug via INTRAVENOUS

## 2024-01-10 MED ORDER — SODIUM CHLORIDE (PF) 0.9 % IJ SOLN
INTRAMUSCULAR | Status: DC | PRN
  Administered 2024-01-10: 60 mL

## 2024-01-10 MED ORDER — 0.9 % SODIUM CHLORIDE (POUR BTL) OPTIME
TOPICAL | Status: DC | PRN
  Administered 2024-01-10: 500 mL

## 2024-01-10 MED ORDER — REMIFENTANIL HCL 1 MG IV SOLR
INTRAVENOUS | Status: AC
  Filled 2024-01-10: qty 1000

## 2024-01-10 MED ORDER — CHLORHEXIDINE GLUCONATE 0.12 % MT SOLN
OROMUCOSAL | Status: AC
  Filled 2024-01-10: qty 15

## 2024-01-10 MED ORDER — MENTHOL 3 MG MT LOZG: 1.0000 | LOZENGE | OROMUCOSAL | Status: DC | PRN

## 2024-01-10 MED ORDER — DROPERIDOL 2.5 MG/ML IJ SOLN: 0.6250 mg | Freq: Once | INTRAMUSCULAR | Status: DC | PRN

## 2024-01-10 MED ORDER — LACTATED RINGERS IV SOLN: INTRAVENOUS | Status: DC

## 2024-01-10 MED ORDER — SODIUM CHLORIDE 0.9 % IV SOLN: 250.0000 mL | INTRAVENOUS | Status: DC

## 2024-01-10 MED ORDER — BUPIVACAINE-EPINEPHRINE (PF) 0.5% -1:200000 IJ SOLN
INTRAMUSCULAR | Status: DC | PRN
  Administered 2024-01-10: 9 mL

## 2024-01-10 MED ORDER — SODIUM CHLORIDE 0.9% FLUSH
3.0000 mL | INTRAVENOUS | Status: DC | PRN
  Administered 2024-01-11: 3 mL via INTRAVENOUS

## 2024-01-10 MED ORDER — LORATADINE 10 MG PO TABS
10.0000 mg | ORAL_TABLET | Freq: Every day | ORAL | Status: DC
  Administered 2024-01-10 – 2024-01-11 (×2): 10 mg via ORAL
  Filled 2024-01-10 (×2): qty 1

## 2024-01-10 MED ORDER — ACETAMINOPHEN 10 MG/ML IV SOLN: 1000.0000 mg | Freq: Once | INTRAVENOUS | Status: DC | PRN

## 2024-01-10 MED ORDER — CEFAZOLIN IN SODIUM CHLORIDE 2-0.9 GM/100ML-% IV SOLN
2.0000 g | Freq: Once | INTRAVENOUS | Status: AC
  Administered 2024-01-10: 2 g via INTRAVENOUS

## 2024-01-10 MED ORDER — TRIAMTERENE-HCTZ 37.5-25 MG PO TABS
1.0000 | ORAL_TABLET | Freq: Every morning | ORAL | Status: DC
  Administered 2024-01-11: 1 via ORAL
  Filled 2024-01-10: qty 1

## 2024-01-10 MED ORDER — SUCCINYLCHOLINE CHLORIDE 200 MG/10ML IV SOSY
PREFILLED_SYRINGE | INTRAVENOUS | Status: AC
  Filled 2024-01-10: qty 10

## 2024-01-10 MED ORDER — ONDANSETRON HCL 4 MG PO TABS: 4.0000 mg | ORAL_TABLET | Freq: Four times a day (QID) | ORAL | Status: DC | PRN

## 2024-01-10 MED ORDER — HYDROMORPHONE HCL 1 MG/ML IJ SOLN
0.5000 mg | INTRAMUSCULAR | Status: AC | PRN
  Administered 2024-01-10: 0.5 mg via INTRAVENOUS
  Filled 2024-01-10: qty 0.5

## 2024-01-10 MED ORDER — PROPOFOL 10 MG/ML IV BOLUS
INTRAVENOUS | Status: DC | PRN
  Administered 2024-01-10: 100 ug/kg/min via INTRAVENOUS
  Administered 2024-01-10: 100 mg via INTRAVENOUS

## 2024-01-10 MED ORDER — ACETAMINOPHEN 650 MG RE SUPP: 650.0000 mg | RECTAL | Status: DC | PRN

## 2024-01-10 MED ORDER — POTASSIUM CHLORIDE CRYS ER 10 MEQ PO TBCR
10.0000 meq | EXTENDED_RELEASE_TABLET | Freq: Two times a day (BID) | ORAL | Status: DC
  Administered 2024-01-10 – 2024-01-11 (×2): 10 meq via ORAL
  Filled 2024-01-10 (×2): qty 1

## 2024-01-10 MED ORDER — ENOXAPARIN SODIUM 40 MG/0.4ML IJ SOSY
40.0000 mg | PREFILLED_SYRINGE | INTRAMUSCULAR | Status: DC
  Administered 2024-01-11: 40 mg via SUBCUTANEOUS

## 2024-01-10 MED ORDER — ONDANSETRON HCL 4 MG/2ML IJ SOLN
INTRAMUSCULAR | Status: DC | PRN
  Administered 2024-01-10: 4 mg via INTRAVENOUS

## 2024-01-10 MED ORDER — SUCCINYLCHOLINE CHLORIDE 200 MG/10ML IV SOSY
PREFILLED_SYRINGE | INTRAVENOUS | Status: DC | PRN
  Administered 2024-01-10: 100 mg via INTRAVENOUS

## 2024-01-10 MED ORDER — CHLORHEXIDINE GLUCONATE 0.12 % MT SOLN
15.0000 mL | Freq: Once | OROMUCOSAL | Status: AC
  Administered 2024-01-10: 15 mL via OROMUCOSAL

## 2024-01-10 MED ORDER — METHOCARBAMOL 1000 MG/10ML IJ SOLN: 500.0000 mg | Freq: Four times a day (QID) | INTRAMUSCULAR | Status: DC | PRN

## 2024-01-10 MED ORDER — EPHEDRINE SULFATE-NACL 50-0.9 MG/10ML-% IV SOSY
PREFILLED_SYRINGE | INTRAVENOUS | Status: DC | PRN
  Administered 2024-01-10: 10 mg via INTRAVENOUS

## 2024-01-10 MED ORDER — VITAMIN B-12 1000 MCG PO TABS
1500.0000 ug | ORAL_TABLET | ORAL | Status: DC
  Administered 2024-01-11: 1500 ug via ORAL
  Filled 2024-01-10: qty 2

## 2024-01-10 MED ORDER — METHYLPREDNISOLONE ACETATE 40 MG/ML IJ SUSP
INTRAMUSCULAR | Status: AC
  Filled 2024-01-10: qty 1

## 2024-01-10 MED ORDER — SODIUM CHLORIDE (PF) 0.9 % IJ SOLN
INTRAMUSCULAR | Status: AC
  Filled 2024-01-10: qty 20

## 2024-01-10 MED ORDER — DOCUSATE SODIUM 100 MG PO CAPS
100.0000 mg | ORAL_CAPSULE | Freq: Two times a day (BID) | ORAL | Status: DC
  Administered 2024-01-10 – 2024-01-11 (×2): 100 mg via ORAL
  Filled 2024-01-10 (×2): qty 1

## 2024-01-10 MED ORDER — OXYCODONE HCL 5 MG PO TABS: 10.0000 mg | ORAL_TABLET | ORAL | Status: DC | PRN

## 2024-01-10 MED ORDER — LIDOCAINE HCL (PF) 2 % IJ SOLN
INTRAMUSCULAR | Status: AC
  Filled 2024-01-10: qty 5

## 2024-01-10 MED ORDER — KETOROLAC TROMETHAMINE 15 MG/ML IJ SOLN
7.5000 mg | Freq: Four times a day (QID) | INTRAMUSCULAR | Status: AC
  Administered 2024-01-10 – 2024-01-11 (×4): 7.5 mg via INTRAVENOUS
  Filled 2024-01-10 (×4): qty 1

## 2024-01-10 MED ORDER — PHENYLEPHRINE HCL-NACL 20-0.9 MG/250ML-% IV SOLN
INTRAVENOUS | Status: DC | PRN
  Administered 2024-01-10: 50 ug/min via INTRAVENOUS

## 2024-01-10 MED ORDER — BUPIVACAINE-EPINEPHRINE (PF) 0.5% -1:200000 IJ SOLN
INTRAMUSCULAR | Status: AC
  Filled 2024-01-10: qty 10

## 2024-01-10 MED ORDER — SODIUM CHLORIDE 0.9 % IV SOLN
INTRAVENOUS | Status: DC | PRN
  Administered 2024-01-10: .1 ug/kg/min via INTRAVENOUS

## 2024-01-10 MED ORDER — PROPOFOL 1000 MG/100ML IV EMUL
INTRAVENOUS | Status: AC
  Filled 2024-01-10: qty 100

## 2024-01-10 MED ORDER — ACETAMINOPHEN 500 MG PO TABS
1000.0000 mg | ORAL_TABLET | Freq: Four times a day (QID) | ORAL | Status: DC
  Administered 2024-01-10 – 2024-01-11 (×3): 1000 mg via ORAL
  Filled 2024-01-10 (×3): qty 2

## 2024-01-10 MED ORDER — PROPOFOL 10 MG/ML IV BOLUS
INTRAVENOUS | Status: AC
  Filled 2024-01-10: qty 20

## 2024-01-10 MED ORDER — OXYCODONE HCL 5 MG PO TABS: 5.0000 mg | ORAL_TABLET | ORAL | Status: DC | PRN

## 2024-01-10 MED ORDER — PHENYLEPHRINE 80 MCG/ML (10ML) SYRINGE FOR IV PUSH (FOR BLOOD PRESSURE SUPPORT)
PREFILLED_SYRINGE | INTRAVENOUS | Status: DC | PRN
  Administered 2024-01-10 (×3): 160 ug via INTRAVENOUS

## 2024-01-10 MED ORDER — LEVOTHYROXINE SODIUM 50 MCG PO TABS
75.0000 ug | ORAL_TABLET | Freq: Every day | ORAL | Status: DC
  Administered 2024-01-11: 75 ug via ORAL
  Filled 2024-01-10: qty 1

## 2024-01-10 MED ORDER — ONDANSETRON HCL 4 MG/2ML IJ SOLN
INTRAMUSCULAR | Status: AC
  Filled 2024-01-10: qty 2

## 2024-01-10 MED ORDER — BUPIVACAINE HCL (PF) 0.5 % IJ SOLN
INTRAMUSCULAR | Status: AC
  Filled 2024-01-10: qty 30

## 2024-01-10 MED ORDER — SURGIFLO WITH THROMBIN (HEMOSTATIC MATRIX KIT) OPTIME
TOPICAL | Status: DC | PRN
  Administered 2024-01-10: 1 via TOPICAL

## 2024-01-10 MED ORDER — LIDOCAINE HCL (CARDIAC) PF 100 MG/5ML IV SOSY
PREFILLED_SYRINGE | INTRAVENOUS | Status: DC | PRN
  Administered 2024-01-10: 100 mg via INTRAVENOUS

## 2024-01-10 MED ORDER — PANTOPRAZOLE SODIUM 40 MG PO TBEC
40.0000 mg | DELAYED_RELEASE_TABLET | Freq: Every day | ORAL | Status: DC
  Administered 2024-01-10 – 2024-01-11 (×2): 40 mg via ORAL
  Filled 2024-01-10 (×2): qty 1

## 2024-01-10 MED ORDER — ACETAMINOPHEN 325 MG PO TABS: 650.0000 mg | ORAL_TABLET | ORAL | Status: DC | PRN

## 2024-01-10 MED ORDER — BUPIVACAINE LIPOSOME 1.3 % IJ SUSP
INTRAMUSCULAR | Status: AC
  Filled 2024-01-10: qty 20

## 2024-01-10 MED ORDER — OXYCODONE HCL 5 MG PO TABS
ORAL_TABLET | ORAL | Status: AC
  Filled 2024-01-10: qty 1

## 2024-01-10 MED ORDER — MAGNESIUM CITRATE PO SOLN: 1.0000 | Freq: Once | ORAL | Status: DC | PRN

## 2024-01-10 MED ORDER — CITALOPRAM HYDROBROMIDE 10 MG PO TABS
30.0000 mg | ORAL_TABLET | Freq: Every evening | ORAL | Status: DC
  Administered 2024-01-10: 30 mg via ORAL
  Filled 2024-01-10: qty 3

## 2024-01-10 MED ORDER — OXYCODONE HCL 5 MG PO TABS
5.0000 mg | ORAL_TABLET | Freq: Once | ORAL | Status: AC | PRN
  Administered 2024-01-10: 5 mg via ORAL

## 2024-01-10 MED ORDER — CEFAZOLIN SODIUM-DEXTROSE 2-4 GM/100ML-% IV SOLN
INTRAVENOUS | Status: AC
  Filled 2024-01-10: qty 100

## 2024-01-10 MED ORDER — VANCOMYCIN HCL 1000 MG IV SOLR
INTRAVENOUS | Status: AC
  Filled 2024-01-10: qty 20

## 2024-01-10 MED ORDER — PHENYLEPHRINE 80 MCG/ML (10ML) SYRINGE FOR IV PUSH (FOR BLOOD PRESSURE SUPPORT)
PREFILLED_SYRINGE | INTRAVENOUS | Status: AC
  Filled 2024-01-10: qty 10

## 2024-01-10 MED ORDER — ACETAMINOPHEN 10 MG/ML IV SOLN
INTRAVENOUS | Status: AC
  Filled 2024-01-10: qty 100

## 2024-01-10 MED ORDER — POLYETHYLENE GLYCOL 3350 17 G PO PACK
17.0000 g | PACK | Freq: Every morning | ORAL | Status: DC
  Administered 2024-01-11: 17 g via ORAL
  Filled 2024-01-10: qty 1

## 2024-01-10 SURGICAL SUPPLY — 37 items
BASIN KIT SINGLE STR (MISCELLANEOUS) ×1 IMPLANT
BUR NEURO DRILL SOFT 3.0X3.8M (BURR) ×1 IMPLANT
DERMABOND ADVANCED .7 DNX12 (GAUZE/BANDAGES/DRESSINGS) IMPLANT
DRAPE C ARM PK CFD 31 SPINE (DRAPES) IMPLANT
DRAPE HD 5FT BACK TABLE (DRAPES) ×1 IMPLANT
DRAPE LAPAROTOMY 100X77 ABD (DRAPES) ×1 IMPLANT
DRAPE SPINE LEICA/WILD 54X150 (DRAPES) IMPLANT
DRAPE TABLE BACK 80X90 (DRAPES) ×1 IMPLANT
ELECTRODE REM PT RTRN 9FT ADLT (ELECTROSURGICAL) ×1 IMPLANT
EVACUATOR 1/8 PVC DRAIN (DRAIN) IMPLANT
FEE INTRAOP CADWELL SUPPLY NCS (MISCELLANEOUS) IMPLANT
FEE INTRAOP MONITOR IMPULS NCS (MISCELLANEOUS) IMPLANT
GLOVE BIOGEL PI IND STRL 6.5 (GLOVE) ×1 IMPLANT
GLOVE SURG SYN 6.5 PF PI (GLOVE) ×1 IMPLANT
GLOVE SURG SYN 8.5 PF PI (GLOVE) ×3 IMPLANT
GOWN SRG LRG LVL 4 IMPRV REINF (GOWNS) ×1 IMPLANT
GOWN SRG XL LVL 3 NONREINFORCE (GOWNS) ×1 IMPLANT
HOLDER FOLEY CATH W/STRAP (MISCELLANEOUS) IMPLANT
KIT PREVENA INCISION MGT 13 (CANNISTER) IMPLANT
KIT SPINAL PRONEVIEW (KITS) ×1 IMPLANT
LAVAGE JET IRRISEPT WOUND (IRRIGATION / IRRIGATOR) IMPLANT
MANIFOLD NEPTUNE II (INSTRUMENTS) ×1 IMPLANT
MARKER SKIN DUAL TIP RULER LAB (MISCELLANEOUS) IMPLANT
NDL SAFETY ECLIP 18X1.5 (MISCELLANEOUS) ×1 IMPLANT
PACK LAMINECTOMY ARMC (PACKS) ×1 IMPLANT
PAD ARMBOARD POSITIONER FOAM (MISCELLANEOUS) ×1 IMPLANT
SOLN 0.9% NACL POUR BTL 500 ML (IV SOLUTION) ×1 IMPLANT
STAPLER SKIN PROX 35W (STAPLE) ×1 IMPLANT
SURGIFLO W/THROMBIN 8M KIT (HEMOSTASIS) ×1 IMPLANT
SUT STRATA 3-0 15 PS-2 (SUTURE) ×1 IMPLANT
SUT VIC AB 0 CT1 27XCR 8 STRN (SUTURE) ×2 IMPLANT
SUT VIC AB 2-0 CT1 18 (SUTURE) ×2 IMPLANT
SUTURE EHLN 3-0 FS-10 30 BLK (SUTURE) IMPLANT
SYR 30ML LL (SYRINGE) ×2 IMPLANT
TAPE CLOTH 3X10 WHT NS LF (GAUZE/BANDAGES/DRESSINGS) ×2 IMPLANT
TRAP FLUID SMOKE EVACUATOR (MISCELLANEOUS) ×1 IMPLANT
TRAY FOLEY SLVR 16FR LF STAT (SET/KITS/TRAYS/PACK) IMPLANT

## 2024-01-10 NOTE — Discharge Instructions (Signed)
  Your surgeon has performed an operation on your neck to relieve pressure on one or more nerves. Many times, patients feel better immediately after surgery and can "overdo it." Even if you feel well, it is important that you follow these activity guidelines. If you do not let your back heal properly from the surgery, you can increase the chance of return of your symptoms. The following are instructions to help in your recovery once you have been discharged from the hospital.  * It is ok to take NSAIDs after surgery. *Regarding compression stockings-  Please wear day and night until you are walking a couple hundred feet three times a day.   Activity    No bending, lifting, or twisting ("BLT"). Avoid lifting objects heavier than 10 pounds (gallon milk jug).  Where possible, avoid household activities that involve lifting, bending, pushing, or pulling such as laundry, vacuuming, grocery shopping, and childcare. Try to arrange for help from friends and family for these activities while your back heals.  Increase physical activity slowly as tolerated.  Taking short walks is encouraged, but avoid strenuous exercise. Do not jog, run, bicycle, lift weights, or participate in any other exercises unless specifically allowed by your doctor. Avoid prolonged sitting, including car rides.  Talk to your doctor before resuming sexual activity.  You should not drive until cleared by your doctor.  Until released by your doctor, you should not return to work or school.  You should rest at home and let your body heal.   You may shower three days after your surgery.  After showering, lightly dab your incision dry. Do not take a tub bath or go swimming for 3 weeks, or until approved by your doctor at your follow-up appointment.  If you smoke, we strongly recommend that you quit.  Smoking has been proven to interfere with normal healing in your back and will dramatically reduce the success rate of your surgery. Please  contact [facility] (800-QUIT-NOW) and use the resources at www.[facility].com for assistance in stopping smoking.  Surgical Incision    If you have staples or stitches on your incision, you should have a follow up scheduled for removal.  Diet            You may return to your usual diet. Be sure to stay hydrated.  When to Contact Us   Although your surgery and recovery will likely be uneventful, you may have some residual numbness, aches, and pains in your back and/or legs. This is normal and should improve in the next few weeks.  However, should you experience any of the following, contact us  immediately: New numbness or weakness Pain that is progressively getting worse, and is not relieved by your pain medications or rest Bleeding, redness, swelling, pain, or drainage from surgical incision Chills or flu-like symptoms Fever greater than 101.0 F (38.3 C) Problems with bowel or bladder functions Difficulty breathing or shortness of breath Warmth, tenderness, or swelling in your calf  Contact Information How to contact us :  If you have any questions/concerns before or after surgery, you can reach us  at [phone], or you can send a mychart message. We can be reached by phone or mychart 8am-4pm, Monday-Friday.  *Please note: Calls after 4pm are forwarded to a third party answering service. Mychart messages are not routinely monitored during evenings, weekends, and holidays. Please call our office to contact the answering service for urgent concerns during non-business hours.

## 2024-01-10 NOTE — Anesthesia Procedure Notes (Signed)
 Procedure Name: Intubation Date/Time: 01/10/2024 1:50 PM  Performed by: Lorriane Arabia, CRNAPre-anesthesia Checklist: Patient identified, Patient being monitored, Timeout performed, Emergency Drugs available and Suction available Patient Re-evaluated:Patient Re-evaluated prior to induction Oxygen Delivery Method: Circle system utilized Preoxygenation: Pre-oxygenation with 100% oxygen Induction Type: IV induction Ventilation: Mask ventilation without difficulty Laryngoscope Size: 3 and McGrath Grade View: Grade II Tube type: Oral Tube size: 7.0 mm Number of attempts: 1 Airway Equipment and Method: Stylet Placement Confirmation: ETT inserted through vocal cords under direct vision, positive ETCO2 and breath sounds checked- equal and bilateral Secured at: 21 cm Tube secured with: Tape Dental Injury: Teeth and Oropharynx as per pre-operative assessment

## 2024-01-10 NOTE — Transfer of Care (Signed)
 Immediate Anesthesia Transfer of Care Note  Patient: Kathy Bean  Procedure(s) Performed: POSTERIOR CERVICAL LAMINECTOMY (Spine Cervical)  Patient Location: PACU  Anesthesia Type:General  Level of Consciousness: drowsy and patient cooperative  Airway & Oxygen Therapy: Patient Spontanous Breathing and Patient connected to face mask oxygen  Post-op Assessment: Report given to RN and Post -op Vital signs reviewed and stable  Post vital signs: Reviewed and stable  Last Vitals:  Vitals Value Taken Time  BP 134/89 01/10/24 16:00  Temp    Pulse 96 01/10/24 16:04  Resp 23 01/10/24 16:04  SpO2 99 % 01/10/24 16:04  Vitals shown include unfiled device data.  Last Pain:  Vitals:   01/10/24 1602  TempSrc:   PainSc: 6          Complications: No notable events documented.

## 2024-01-10 NOTE — Interval H&P Note (Signed)
 History and Physical Interval Note:  01/10/2024 1:07 PM  Kathy Bean  has presented today for surgery, with the diagnosis of G95.9 Cervical myelopathy M48.02 Cervical stenosis of spinal canal.  The various methods of treatment have been discussed with the patient and family. After consideration of risks, benefits and other options for treatment, the patient has consented to  Procedure(s) with comments: POSTERIOR CERVICAL LAMINECTOMY (N/A) - C2-3 Laminectomy, open as a surgical intervention.  The patient's history has been reviewed, patient examined, no change in status, stable for surgery.  I have reviewed the patient's chart and labs.  Questions were answered to the patient's satisfaction.    Heart sounds normal no MRG. Chest Clear to Auscultation Bilaterally.  Lorree Millar

## 2024-01-10 NOTE — Anesthesia Preprocedure Evaluation (Addendum)
 Anesthesia Evaluation  Patient identified by MRN, date of birth, ID band Patient awake    Reviewed: Allergy & Precautions, H&P , NPO status , Patient's Chart, lab work & pertinent test results  Airway Mallampati: III  TM Distance: >3 FB Neck ROM: full    Dental no notable dental hx.    Pulmonary former smoker Recent bronchitis. Symptoms ended 1 week ago Per IM- Moderate bronchitis-post Z-Pak/prednisone, still coughing fairly deeply, Omnicef x 7 days   Pulmonary exam normal        Cardiovascular Exercise Tolerance: Poor Normal cardiovascular exam(-) dysrhythmias      Neuro/Psych  PSYCHIATRIC DISORDERS      Parkinson disease  Neuromuscular disease    GI/Hepatic negative GI ROS, Neg liver ROS,,,  Endo/Other  Hypothyroidism    Renal/GU      Musculoskeletal  (+) Arthritis ,    Abdominal Normal abdominal exam  (+)   Peds  Hematology negative hematology ROS (+)   Anesthesia Other Findings Past Medical History: 11/2014: Breast cancer (HCC)     Comment:  left 2016: Cancer (HCC)     Comment:  left breast No date: Depression No date: Dysrhythmia     Comment:  heart racing No date: Family history of adverse reaction to anesthesia     Comment:  brother had difficulty going under anesthseia No date: Headache     Comment:  hx of migraines   period induced No date: Hypertension No date: Hypothyroidism No date: Memory changes No date: Osteoarthritis No date: Parkinson disease (HCC) No date: Personal history of radiation therapy  Past Surgical History: 10/2014: BREAST BIOPSY; Left     Comment:  positive 2016: BREAST LUMPECTOMY; Left     Comment:  invasive mammary 11/27/2021: CATARACT EXTRACTION W/PHACO; Right     Comment:  Procedure: CATARACT EXTRACTION PHACO AND INTRAOCULAR               LENS PLACEMENT (IOC) RIGHT 6.56 00:47.9;  Surgeon:               Enola Feliciano Hugger, MD;  Location: [facility]                CN[facility];  Service: Ophthalmology;  Laterality: Right; 12/11/2021: CATARACT EXTRACTION W/PHACO; Left     Comment:  Procedure: CATARACT EXTRACTION PHACO AND INTRAOCULAR               LENS PLACEMENT (IOC) LEFT 3.47 00:29.2;  Surgeon:               Enola Feliciano Hugger, MD;  Location: [facility]               CNTR;  Service: Ophthalmology;  Laterality: Left; 07/17/2018: COLONOSCOPY; Left     Comment:  Procedure: COLONOSCOPY;  Surgeon: Janalyn Keene NOVAK,               MD;  Location: [facility] END[facility]COPY;  Service: Endoscopy;                Laterality: Left; No date: DILATION AND CURETTAGE OF UTERUS 07/17/2018: ESOPHAGOGASTRODUODENOSCOPY; Left     Comment:  Procedure: ESOPHAGOGASTRODUODENOSCOPY (EGD);  Surgeon:               Janalyn Keene NOVAK, MD;  Location: [facility] END[facility]COPY;                Service: Endoscopy;  Laterality: Left; No date: Laminectomy Posterior Cervicle Decomp W/Facetectomy &  Foraminotomy     Comment:  [id number] x 3...SABRASABRAEXCISION  CENTRAL LEFT DISC HERNIATION L               2-3; Surgeon: Debby Lorrene Kaufmann, MD; Location: [facility] OR;               Service: Orthopedics; Laterality: Left No date: Laminectomy Posterior Lumbar Facetectomy & Foraminotomy W/ Decomp     Comment:  BILATERAL LUMBAR DECOMPRESSION L2-3, L3-4, L4-5;               Surgeon: Debby Lorrene Kaufmann, MD; Location: [facility] OR;               Service: Orthopedics; Laterality: Bilateral; 11/29/2014: PARTIAL MASTECTOMY WITH NEEDLE LOCALIZATION; Left     Comment:  Procedure: PARTIAL MASTECTOMY WITH NEEDLE LOCALIZATION;               Surgeon: Larinda Unknown Sharps, MD;  Location: [facility] ORS;                Service: General;  Laterality: Left; 11/29/2014: SENTINEL NODE BIOPSY; Left     Comment:  Procedure: SENTINEL NODE BIOPSY;  Surgeon: Larinda Unknown Sharps, MD;  Location: [facility] ORS;  Service: General;                Laterality: Left;     Reproductive/Obstetrics negative OB ROS                               Anesthesia Physical Anesthesia Plan  ASA: 3  Anesthesia Plan: General ETT   Post-op Pain Management:    Induction: Intravenous  PONV Risk Score and Plan: 2 and Ondansetron , Dexamethasone  and Midazolam   Airway Management Planned: Oral ETT  Additional Equipment:   Intra-op Plan:   Post-operative Plan: Extubation in OR  Informed Consent: I have reviewed the patients History and Physical, chart, labs and discussed the procedure including the risks, benefits and alternatives for the proposed anesthesia with the patient or authorized representative who has indicated his/her understanding and acceptance.     Dental Advisory Given  Plan Discussed with: CRNA and Surgeon  Anesthesia Plan Comments:          Anesthesia Quick Evaluation

## 2024-01-10 NOTE — Op Note (Signed)
 Indications: [name] is a 73 y.o. female with  G95.9 Cervical myelopathy, M48.02 Cervical stenosis of spinal canal   Findings: stenosis  Preoperative Diagnosis:  G95.9 Cervical myelopathy, M48.02 Cervical stenosis of spinal canal  Postoperative Diagnosis: same   EBL: 25 ml IVF: see anesthesia record Drains: none Disposition: Extubated and Stable to PACU Complications: none  No foley catheter was placed.   Preoperative Note:   Risks of surgery discussed include: infection, bleeding, stroke, coma, death, paralysis, CSF leak, nerve/spinal cord injury, numbness, tingling, weakness, complex regional pain syndrome, recurrent stenosis and/or disc herniation, vascular injury, development of instability, neck/back pain, need for further surgery, persistent symptoms, development of deformity, and the risks of anesthesia. The patient understood these risks and agreed to proceed.  Operative Note:   OPERATIVE PROCEDURE:  1. Posterior laminectomy C2-3 2. Use of flouroscopy   OPERATIVE PROCEDURE:  After induction of general anesthesia, the patient was placed in the prone position on the operative table.  A midline incision was then planned using fluoroscopy.  A timeout was performed, and antibiotics given.  Next, the posterior cervical region was prepped and draped in the usual sterile fashion. The incision was injected with local anesthetic, then opened sharply. A subperiosteal dissection was then carried out to expose the posterior elements from C2 to C3.  The area was localized, then the rongeur was used to remove the inferior portion of the C2 lamina.  The drill was then used to perform C3 laminectomy.  The inferior third of the C2 lamina was also removed.  The 2mm punch was used to remove the overgrown ligament at C2/3 until no compression was noted.  We confirmed superior extent of decompression with flouroscopy, then irrigated and closed after obtaining hemostasis.  The wound was  closed in a multilayer fashion using interrupted 0 and 2-0 Vicryl sutures.  The final skin edges were reapproximated using staples and a dressing.  After closure, the patient was flipped supine. Patient was then handed back over to anesthesia.  All counts were correct at the conclusion of the procedure.  Neurological monitoring was used throughout, and there were no changes.  Edsel Goods PA acted as an designer, television/film set throughout the case. An assistant was required for this procedure due to the complexity.  The assistant provided assistance in tissue manipulation and suction, and was required for the successful and safe performance of the procedure. I performed the critical portions of the procedure.   Reeves Daisy MD

## 2024-01-11 ENCOUNTER — Encounter: Payer: Self-pay | Admitting: Neurosurgery

## 2024-01-11 ENCOUNTER — Other Ambulatory Visit: Payer: Self-pay

## 2024-01-11 DIAGNOSIS — M4802 Spinal stenosis, cervical region: Secondary | ICD-10-CM | POA: Diagnosis not present

## 2024-01-11 MED ORDER — OXYCODONE HCL 5 MG PO TABS
5.0000 mg | ORAL_TABLET | ORAL | 0 refills | Status: DC | PRN
  Filled 2024-01-11: qty 30, 5d supply, fill #0

## 2024-01-11 MED ORDER — SENNA 8.6 MG PO TABS
1.0000 | ORAL_TABLET | Freq: Two times a day (BID) | ORAL | 0 refills | Status: DC | PRN
  Filled 2024-01-11: qty 30, 15d supply, fill #0

## 2024-01-11 MED ORDER — METHOCARBAMOL 500 MG PO TABS
500.0000 mg | ORAL_TABLET | Freq: Three times a day (TID) | ORAL | 0 refills | Status: AC | PRN
  Filled 2024-01-11: qty 30, 10d supply, fill #0

## 2024-01-11 NOTE — Evaluation (Signed)
 Occupational Therapy Evaluation Patient Details Name: Kathy Bean MRN: [id number] DOB: 01-Jan-1951 Today's Date: 01/11/2024   History of Present Illness   [name] is a 73 y.o female with a history of HTN, hypothyroidism, parkinson's disease, GERD, anxiety, depression, breast CA, chronic anemia, peripheral neuropathy, chronic ETOH use, hydrocephalus. She experiences balance issues, feeling unbalanced in every direction, but has not had any recent falls. A fall three months ago resulted in a T12 fracture, which is not painful. She has difficulty with fine motor tasks, such as buttoning clothes, and occasionally drops objects. She uses a walker for mobility at home. Sinemet  provides symptom relief for Parkinson's disease. She underwent major lower back surgery in 2015 with no subsequent lower back pain. There is no numbness or tingling in her arms, no issues with grip strength, and no numbness in her hands.     She is also having some back pain.  She previously had surgery approximately 10 years ago. 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     The symptoms are causing a significant impact on the patient's life. She underwent a posterior laminectomy of C2-3 on 12/1.     Clinical Impressions Pt was seen for OT evaluation this date. Pt lives with her spouse and cat, she was ambulatory with RW in-home distances and mod indep with ADL. Pt presents with deficits in posterior neck pain, affecting safe and optimal ADL completion. Pt currently requires PRN MIN A for LB ADL tasks that pt reports spouse is able to provide. Pt edu in cervical precautions and how to maintain during ADL, mobility; AE/DME, falls prevention, and home/routines modifications to maximize safety. Handout provided to support recall and carryover. Pt verbalized understanding and denied additional questions/concerns. No additional acute OT needs. Will sign off.      If plan is discharge home, recommend the  following:   A little help with bathing/dressing/bathroom;A little help with walking and/or transfers;Assist for transportation;Assistance with cooking/housework     Functional Status Assessment   Patient has had a recent decline in their functional status and demonstrates the ability to make significant improvements in function in a reasonable and predictable amount of time.     Equipment Recommendations   None recommended by OT     Recommendations for Other Services         Precautions/Restrictions   Precautions Precautions: Cervical Precaution Booklet Issued: Yes (comment) Recall of Precautions/Restrictions: Intact Restrictions Weight Bearing Restrictions Per Provider Order: No Other Position/Activity Restrictions: no BLTA     Mobility Bed Mobility     General bed mobility comments: declined    Transfers     General transfer comment: declined        ADL either performed or assessed with clinical judgement   ADL Overall ADL's : Needs assistance/impaired       General ADL Comments: Anticipate pt will require PRN MIN A For LB ADL tasks, pt has reacher she is familiar with using for LB ADL and spouse able to provide assist as needed.      Pertinent Vitals/Pain Pain Assessment Pain Assessment: 0-10 Pain Score: 4  Pain Location: posterior incision Pain Descriptors / Indicators: Aching Pain Intervention(s): Limited activity within patient's tolerance, Monitored during session, Repositioned     Extremity/Trunk Assessment Upper Extremity Assessment Upper Extremity Assessment: Overall WFL for tasks assessed   Lower Extremity Assessment Lower Extremity Assessment: Overall WFL for tasks assessed   Cervical / Trunk Assessment Cervical / Trunk Assessment: Kyphotic  Communication Communication Communication: No apparent difficulties   Cognition Arousal: Alert Behavior During Therapy: WFL for tasks assessed/performed Cognition: No apparent  impairments                               Following commands: Intact       Cueing  General Comments   Cueing Techniques: Verbal cues      Exercises Other Exercises Other Exercises: Pt edu in cervical precautions and how to maintain during ADL, mobility; AE/DME, falls prevention, and home/routines modifications to maximize safety.   Shoulder Instructions      Home Living Family/patient expects to be discharged to:: Private residence Living Arrangements: Spouse/significant other Available Help at Discharge: Family Type of Home: House Home Access: Stairs to enter Secretary/administrator of Steps: 3 Entrance Stairs-Rails: Can reach both Home Layout: Two level Alternate Level Stairs-Number of Steps: full flight and chair lift   Bathroom Shower/Tub: Chief Strategy Officer: Standard Bathroom Accessibility: Yes   Home Equipment: Shower seat;Wheelchair - manual;Grab bars - tub/shower;Rollator (4 wheels);Adaptive equipment;Hand held shower head Adaptive Equipment: Reacher Additional Comments: reports she's in recliner most of the time; only need to get upstairs is to use the bath      Prior Functioning/Environment Prior Level of Function : Needs assist;History of Falls (last six months)             Mobility Comments: amb with RW for <25 ft at a time; reports she spends most time in recliner but with amb with RW to bathroom or kitchen. increased falls the last 6 months d/t impaired balance ADLs Comments: reports more difficulty with LB dressing last several months    OT Problem List: Pain;Impaired balance (sitting and/or standing)   OT Treatment/Interventions:        OT Goals(Current goals can be found in the care plan section)   Acute Rehab OT Goals Patient Stated Goal: go home OT Goal Formulation: All assessment and education complete, DC therapy   OT Frequency:       Co-evaluation              AM-PAC OT 6 Clicks Daily  Activity     Outcome Measure Help from another person eating meals?: None Help from another person taking care of personal grooming?: None Help from another person toileting, which includes using toliet, bedpan, or urinal?: None Help from another person bathing (including washing, rinsing, drying)?: A Little Help from another person to put on and taking off regular upper body clothing?: None Help from another person to put on and taking off regular lower body clothing?: A Little 6 Click Score: 22   End of Session    Activity Tolerance: Patient tolerated treatment well Patient left: in bed;with call bell/phone within reach  OT Visit Diagnosis: Other abnormalities of gait and mobility (R26.89)                Time: 8988-8977 OT Time Calculation (min): 11 min Charges:  OT General Charges $OT Visit: 1 Visit OT Evaluation $OT Eval Low Complexity: 1 Low OT Treatments $Self Care/Home Management : 8-22 mins  Warren SAUNDERS., MPH, MS, OTR/L ascom [phone] 01/11/24, 10:37 AM

## 2024-01-11 NOTE — Discharge Summary (Signed)
 Discharge Summary  Patient ID: Kathy Bean MRN: [id number] DOB/AGE: 1950-04-26 73 y.o.  Admit date: 01/10/2024 Discharge date: 01/11/2024  Admission Diagnoses: G95.9 Cervical myelopathy, M48.02 Cervical stenosis of spinal canal  Discharge Diagnoses:  Principal Problem:   Cervical myelopathy (HCC) Active Problems:   Cervical stenosis of spinal canal   Discharged Condition: good  Hospital Course:  Kathy Bean is a 73 y.o presenting with cervical stenosis and myelopathy s/p C2-3 decompression. Her intraoperative course was uncomplicated. She was admitted overnight for pain control and therapy evaluation. She was seen by therapy and deemed appropriate for discharge home on POD1 with recommendations for [facility]. Her pain was well controlled on PO medications. She was discharged home with medication for pain, muscle relaxers, and stool softeners.  Consults: None  Significant Diagnostic Studies: NA  Treatments: surgery: as above. Please see separately dictated operative report for further details   Discharge Exam: Blood pressure (!) 123/91, pulse 88, temperature 97.6 F (36.4 C), temperature source Oral, resp. rate 16, height 5' 7 (1.702 m), weight 86.7 kg, SpO2 97%. AA Ox3 CNI   Strength: Side Biceps Triceps Deltoid Interossei Grip Wrist Ext. Wrist Flex.  R 5 5 5 5  4+ 5 5  L 5 5 5 5  4+ 5 5    Side Iliopsoas Quads Hamstring PF DF EHL  R 5 5 5 5 5 5   L 5 5 5 5 5 5    Incision with clean post-op dressing in place  Disposition: Discharge disposition: 06-Home-Health Care Svc       Discharge Instructions     Incentive spirometry RT   Complete by: As directed    Remove dressing in 48 hours   Complete by: As directed       Allergies as of 01/11/2024   No Known Allergies      Medication List     TAKE these medications    acetaminophen  325 MG tablet Commonly known as: TYLENOL  Take 650 mg by mouth every 4 (four) hours as needed.   aspirin EC 81 MG tablet Take  81 mg by mouth in the morning. Swallow whole.   carbidopa -levodopa  25-100 MG tablet Commonly known as: SINEMET  IR Take 2 tablets by mouth 4 (four) times daily.   citalopram  20 MG tablet Commonly known as: CELEXA  Take 30 mg by mouth every evening.   fexofenadine 180 MG tablet Commonly known as: ALLEGRA Take 180 mg by mouth in the morning.   levothyroxine  75 MCG tablet Commonly known as: SYNTHROID  Take 75 mcg by mouth daily before breakfast.   methocarbamol  500 MG tablet Commonly known as: ROBAXIN  Take 1 tablet (500 mg total) by mouth every 8 (eight) hours as needed for muscle spasms.   omeprazole 20 MG capsule Commonly known as: PRILOSEC Take 20 mg by mouth every evening.   oxyCODONE  5 MG immediate release tablet Commonly known as: Oxy IR/ROXICODONE  Take 1 tablet (5 mg total) by mouth every 4 (four) hours as needed for moderate pain (pain score 4-6).   polyethylene glycol 17 g packet Commonly known as: MIRALAX  / GLYCOLAX  Take 17 g by mouth in the morning.   potassium chloride  10 MEQ tablet Commonly known as: KLOR-CON  Take 10 mEq by mouth 2 (two) times daily.   senna 8.6 MG Tabs tablet Commonly known as: SENOKOT Take 1 tablet (8.6 mg total) by mouth 2 (two) times daily as needed for moderate constipation or mild constipation.   triamterene -hydrochlorothiazide  37.5-25 MG tablet Commonly known as: MAXZIDE -25 Take 1 tablet by mouth  in the morning.   VITAMIN B-12 PO Take 1,500 mcg by mouth 4 (four) times a week.        Contact information for after-discharge care     Home Medical Care     [facility] [place] .   Service: Home Health Services Contact information: [phone] [place]  [phone]                     Signed: Edsel Jama Goods 01/11/2024, 12:14 PM

## 2024-01-11 NOTE — Care Management CC44 (Signed)
 Condition Code 44 Documentation Completed  Patient Details  Name: Kathy Bean MRN: [id number] Date of Birth: 1950-12-24   Condition Code 44 given:  Yes Patient signature on Condition Code 44 notice:  Yes Documentation of 2 MD's agreement:  Yes Code 44 added to claim:  Yes    Keyton Bhat, LCSW 01/11/2024, 9:11 AM

## 2024-01-11 NOTE — Progress Notes (Signed)
   Neurosurgery Progress Note  History: SALEEN PEDEN is a 73 y.o s/p C2-3 posterior laminectomy  POD1: Pt reports she is doing well this morning with some expected neck soreness.   Physical Exam: Vitals:   01/10/24 2355 01/11/24 0340  BP: 101/83 115/77  Pulse: (!) 107 88  Resp: 18 17  Temp: (!) 97.5 F (36.4 C) (!) 97.4 F (36.3 C)  SpO2: 93% 95%    AA Ox3 CNI  Strength: Side Biceps Triceps Deltoid Interossei Grip Wrist Ext. Wrist Flex.  R 5 5 5 5  4+ 5 5  L 5 5 5 5  4+ 5 5    Side Iliopsoas Quads Hamstring PF DF EHL  R 5 5 5 5 5 5   L 5 5 5 5 5 5     Data:  Other tests/results: see results review  Assessment/Plan:  JERMIYAH RICOTTA is a 73 y.o presenting with cervical stenosis and myelopathy s/p C2-3 decompression  - mobilize - pain control - DVT prophylaxis - PTOT; dispo plannign underway  Edsel Goods PA-C Department of Neurosurgery

## 2024-01-11 NOTE — Evaluation (Addendum)
 Physical Therapy Evaluation Patient Details Name: Kathy Bean MRN: [id number] DOB: August 18, 1950 Today's Date: 01/11/2024  History of Present Illness  [name] is a 73 y.o female with a history of HTN, hypothyroidism, parkinson's disease, GERD, anxiety, depression, breast CA, chronic anemia, peripheral neuropathy, chronic ETOH use, hydrocephalus. She experiences balance issues, feeling unbalanced in every direction, but has not had any recent falls. A fall three months ago resulted in a T12 fracture, which is not painful. She has difficulty with fine motor tasks, such as buttoning clothes, and occasionally drops objects. She uses a walker for mobility at home. Sinemet  provides symptom relief for Parkinson's disease. She underwent major lower back surgery in 2015 with no subsequent lower back pain. There is no numbness or tingling in her arms, no issues with grip strength, and no numbness in her hands.     She is also having some back pain.  She previously had surgery approximately 10 years ago. 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     The symptoms are causing a significant impact on the patient's life. She underwent a posterior laminectomy of C2-3 on 12/1.  Clinical Impression  Pt is a very pleasant 73 y.o. female admitted for elective posterior laminectomy C2-3 on 01/10/24. Pt was greeted in bed with spouse at bedside. Pt edu on cervical precautions following surgery. Pt required mod-max A for supine>sit and sit>supine. During bed mobility, pt tends to seek external assist too early and benefits from vc to encourage independence. She is able to elevate trunk ti sit up but requires mod/max A to guide legs off bed and scoot to EOB. She is able to complete STS to RW from elevated bed height with mod A d/t weakness and retropulsive posturing. Once standing, she ambulated 30 ft with RW and CGA/min A and w/c follow before needing to take a break d/t pain in her neck. She was wheeled to  rehab gym and completed 4 steps using bilat handrails with CGA/min A for steadiness. Pt was transported back toher room where edu was given to spouse on safe guarding techniques for stairs. Pt was left in bed with all belongings in reach. She will continue to benefit from skilled PT services to address her reduced functional strength and mobility.      If plan is discharge home, recommend the following: A little help with walking and/or transfers;A little help with bathing/dressing/bathroom   Can travel by private vehicle        Equipment Recommendations BSC/3in1  Recommendations for Other Services       Functional Status Assessment Patient has had a recent decline in their functional status and demonstrates the ability to make significant improvements in function in a reasonable and predictable amount of time.     Precautions / Restrictions Precautions Precautions: Cervical Recall of Precautions/Restrictions: Intact Restrictions Weight Bearing Restrictions Per Provider Order: No Other Position/Activity Restrictions: no BLTA      Mobility  Bed Mobility Overal bed mobility: Needs Assistance Bed Mobility: Supine to Sit, Sit to Supine     Supine to sit: Mod assist, Max assist Sit to supine: Mod assist, Max assist   General bed mobility comments: pt tends to reach for external assist more than needed; vc for encouragement to perform as much on her own as possible; able to elevate trunk during supine to sit but requires mod-max assist to guide legs off bed and scoot to EOB.    Transfers Overall transfer level: Needs assistance Equipment used:  Rolling walker (2 wheels) Transfers: Sit to/from Stand Sit to Stand: Mod assist           General transfer comment: pt requires mod A physical assist to perform STS to RW; additional vc to push from bed with one hand rather than gripping SPT's arm. Pt is retropulsive and need cuing for hip ext upon standing.     Ambulation/Gait Ambulation/Gait assistance: Min assist, Contact guard assist Gait Distance (Feet): 30 Feet Assistive device: Rolling walker (2 wheels) Gait Pattern/deviations: Narrow base of support, Trunk flexed, Leaning posteriorly, Decreased stride length       General Gait Details: abmulated 30 ft with RW and cuing for maintaing cervical precations; w/c follow in order to get to/from stairs safely  Stairs Stairs: Yes Stairs assistance: Contact guard assist, Min assist Stair Management: Two rails, Step to pattern Number of Stairs: 4 General stair comments: able to ascend/descend stairs with CGA/min A for steadiness  Wheelchair Mobility     Tilt Bed    Modified Rankin (Stroke Patients Only)       Balance Overall balance assessment: Needs assistance Sitting-balance support: No upper extremity supported, Feet supported Sitting balance-Leahy Scale: Fair Sitting balance - Comments: able to balance EOB without UE support   Standing balance support: Reliant on assistive device for balance, During functional activity, Bilateral upper extremity supported Standing balance-Leahy Scale: Poor Standing balance comment: reliant on RW for support                             Pertinent Vitals/Pain Pain Assessment Pain Assessment: Faces Faces Pain Scale: Hurts a little bit Pain Location: purewick position Pain Descriptors / Indicators: Grimacing Pain Intervention(s): Repositioned    Home Living Family/patient expects to be discharged to:: Private residence Living Arrangements: Spouse/significant other Available Help at Discharge: Family Type of Home: House Home Access: Stairs to enter Entrance Stairs-Rails: Can reach both Entrance Stairs-Number of Steps: 3 Alternate Level Stairs-Number of Steps: full flight and chair lift Home Layout: Two level Home Equipment: Shower seat;Wheelchair - manual;Grab bars - tub/shower;Rollator (4 wheels);Adaptive equipment;Hand  held shower head Additional Comments: reports she's in recliner most of the time; only need to get upstairs is to use the bath    Prior Function Prior Level of Function : Needs assist;History of Falls (last six months)             Mobility Comments: amb with RW for <25 ft at a time; reports she spends most time in recliner but with amb with RW to bathroom or kitchen. increased falls the last 6 months d/t impaired balance ADLs Comments: reports more difficulty with LB dressing last several months     Extremity/Trunk Assessment   Upper Extremity Assessment Upper Extremity Assessment: Overall WFL for tasks assessed    Lower Extremity Assessment Lower Extremity Assessment: Overall WFL for tasks assessed    Cervical / Trunk Assessment Cervical / Trunk Assessment: Kyphotic  Communication   Communication Communication: No apparent difficulties    Cognition Arousal: Alert Behavior During Therapy: WFL for tasks assessed/performed   PT - Cognitive impairments: No apparent impairments                         Following commands: Intact       Cueing Cueing Techniques: Verbal cues     General Comments      Exercises Other Exercises Other Exercises: edu on safe hand placement to  RW for t/d Other Exercises: edu to spouse for safe guarding techniques for stairs Other Exercises: ascend/descend 4 steps with CGA/min A Other Exercises: edu on cervical precautions   Assessment/Plan    PT Assessment Patient needs continued PT services  PT Problem List Decreased strength;Decreased activity tolerance;Decreased balance;Decreased mobility;Decreased coordination       PT Treatment Interventions Gait training;Stair training;Functional mobility training;Therapeutic activities;Therapeutic exercise;Balance training    PT Goals (Current goals can be found in the Care Plan section)  Acute Rehab PT Goals Patient Stated Goal: to go home PT Goal Formulation: With patient Time  For Goal Achievement: 01/25/24 Potential to Achieve Goals: Good    Frequency Min 2X/week     Co-evaluation               AM-PAC PT 6 Clicks Mobility  Outcome Measure Help needed turning from your back to your side while in a flat bed without using bedrails?: A Little Help needed moving from lying on your back to sitting on the side of a flat bed without using bedrails?: A Little Help needed moving to and from a bed to a chair (including a wheelchair)?: A Little Help needed standing up from a chair using your arms (e.g., wheelchair or bedside chair)?: A Little Help needed to walk in hospital room?: A Little Help needed climbing 3-5 steps with a railing? : A Little 6 Click Score: 18    End of Session Equipment Utilized During Treatment: Gait belt Activity Tolerance: Patient tolerated treatment well Patient left: in bed;with call bell/phone within reach;with bed alarm set;with family/visitor present Nurse Communication: Mobility status PT Visit Diagnosis: Other abnormalities of gait and mobility (R26.89);Unsteadiness on feet (R26.81);Repeated falls (R29.6);History of falling (Z91.81);Difficulty in walking, not elsewhere classified (R26.2)    Time: 9092-9052 PT Time Calculation (min) (ACUTE ONLY): 40 min   Charges:   PT Evaluation $PT Eval Moderate Complexity: 1 Mod PT Treatments $Therapeutic Activity: 23-37 mins PT General Charges $$ ACUTE PT VISIT: 1 Visit         Allena Bulls, SPT  Maryanne Finder, PT, DPT Physical Therapist - [facility]  [facility] 01/11/2024, 1:23 PM

## 2024-01-11 NOTE — Plan of Care (Signed)
  Problem: Education: Goal: Knowledge of General Education information will improve Description: Including pain rating scale, medication(s)/side effects and non-pharmacologic comfort measures Outcome: Progressing   Problem: Coping: Goal: Level of anxiety will decrease Outcome: Progressing   Problem: Pain Managment: Goal: General experience of comfort will improve and/or be controlled Outcome: Progressing   Problem: Safety: Goal: Ability to remain free from injury will improve Outcome: Progressing   Problem: Skin Integrity: Goal: Risk for impaired skin integrity will decrease Outcome: Progressing   Problem: Skin Integrity: Goal: Risk for impaired skin integrity will decrease Outcome: Progressing   Problem: Pain Management: Goal: Pain level will decrease Outcome: Progressing

## 2024-01-11 NOTE — Progress Notes (Signed)
 Patient discharged to home with husband. All discharge goals met.

## 2024-01-11 NOTE — Anesthesia Postprocedure Evaluation (Signed)
 Anesthesia Post Note  Patient: Kathy Bean  Procedure(s) Performed: POSTERIOR CERVICAL LAMINECTOMY (Spine Cervical)  Patient location during evaluation: PACU Anesthesia Type: General Level of consciousness: awake and alert Pain management: pain level controlled Vital Signs Assessment: post-procedure vital signs reviewed and stable Respiratory status: spontaneous breathing, nonlabored ventilation and respiratory function stable Cardiovascular status: blood pressure returned to baseline and stable Postop Assessment: no apparent nausea or vomiting Anesthetic complications: no   No notable events documented.   Last Vitals:  Vitals:   01/11/24 0340 01/11/24 0817  BP: 115/77 (!) 123/91  Pulse: 88 88  Resp: 17 16  Temp: (!) 36.3 C 36.4 C  SpO2: 95% 97%    Last Pain:  Vitals:   01/11/24 0817  TempSrc: Oral  PainSc:                  [name]

## 2024-01-11 NOTE — Plan of Care (Signed)

## 2024-01-13 ENCOUNTER — Telehealth: Payer: Self-pay | Admitting: Neurosurgery

## 2024-01-13 DIAGNOSIS — G959 Disease of spinal cord, unspecified: Secondary | ICD-10-CM

## 2024-01-13 DIAGNOSIS — Z9889 Other specified postprocedural states: Secondary | ICD-10-CM

## 2024-01-13 NOTE — Telephone Encounter (Signed)
 01/10/24 C2-3 Laminectomy, open with Clois  Returned patient's husband's call regarding post surgical pain. Patient states that she feels like she has a crick in her neck, stiffness that is all the time, but comes and goes with severity. She states that her pain was bad this morning, but that she took medications and feels better now after a nap (5/10).   Current Regimen: Tylenol : 1000mg  Q6 hours Robaxin : 500mg  Q8 hours Oxycodone : 5mg  Q4-5 hours Heat/ice - not using Creams/patches - not using   Advised not to take any more tylenol  than 1000mg  every 6 hours. Recommended she use ice or heat to see if that helps. Additionally advised to set a timer at night to take medication so that she does not wake up in severe pain.   Patient agreeable and will reach out if pain becomes unmanageable.

## 2024-01-13 NOTE — Telephone Encounter (Signed)
 Patient husband called wanting to speak with Stacy regarding pain the patient is experiencing post surgery.  He states it is a persistent pain, and the patient can't move much.  He has checked the surgical site and there is no redness, discharge or swelling and the patient is not running a fever.   He is requesting a call back to discuss.

## 2024-01-14 MED ORDER — OXYCODONE HCL 5 MG PO TABS: 5.0000 mg | ORAL_TABLET | ORAL | 0 refills | Status: DC | PRN

## 2024-01-14 NOTE — Telephone Encounter (Signed)
 Is pain located all in her neck or down the arms?   When she takes the oxycodone  or the robaxin , does it help at all?   Do you know if we have HHPT set up for her?

## 2024-01-14 NOTE — Telephone Encounter (Signed)
 Patient left a message that she is in great pain and is requesting a call back.

## 2024-01-14 NOTE — Telephone Encounter (Signed)
 Okay. She can increase oxycodone  to 1 po q 3 hours as needed. If she can go 4 hours, then I would.   How much oxycodone  does she have left? She likely will a refill over the weekend. She is not due until Sunday 12/7, but may be able to fill earlier since we are changing directions.

## 2024-01-14 NOTE — Telephone Encounter (Signed)
 She is not sure if prednisone worked for her in the past, she says she took it for inflammation and didn't pay attention to her pain before. She would like to increase her pain medicine.

## 2024-01-14 NOTE — Addendum Note (Signed)
 Addended byBETHA HILMA HASTINGS on: 01/14/2024 03:47 PM   Modules accepted: Orders

## 2024-01-14 NOTE — Telephone Encounter (Signed)
 Patient was notified that refill was being sent when [facility] spoke with her at 3:39pm.

## 2024-01-14 NOTE — Telephone Encounter (Signed)
 01/10/24 C2-3 Laminectomy, open with Kathy Bean   Patient called again today, saying that her pain is real painful and that it takes her 10 minutes to get to the bathroom. States her pain is 8-9/10 at this time.    Current Regimen: Tylenol : 1000mg  Q6 hours Robaxin : 500mg  Q8 hours Oxycodone : 5mg  Q4-5 hours Heat/ice - not using Creams/patches - not using     [facility] [address]

## 2024-01-14 NOTE — Telephone Encounter (Signed)
 Pain is only in her neck. It does not go down her arms at all.  PT ([facility]) has been in contact and it has been back and forth. They wanted to come right away and she told them she didn't feel like it, maybe next week because she wasn't feeling good.   Oxycode and robaxin  do help, but do not last long. (Relief lasts maybe 3 hours)

## 2024-01-14 NOTE — Telephone Encounter (Signed)
 Refill of oxycodone  sent to pharmacy to fill on Sunday 12/7. Directions changed to q 3 hours prn.   Will plan to change back to q 4 at next refill.   PMP reviewed and is appropriate.

## 2024-01-14 NOTE — Telephone Encounter (Signed)
 DOS 01/10/24   C2-C3 decompression  Recommend she try ice or heat to back of her neck.   Can try a steroid dose pack to help with overall pain and inflammation. Looks like she has taken prednisone previously.   Does she want to try this?   Other options would be increasing her pain medication.

## 2024-01-18 ENCOUNTER — Telehealth: Payer: Self-pay | Admitting: Orthopedic Surgery

## 2024-01-18 DIAGNOSIS — Z9889 Other specified postprocedural states: Secondary | ICD-10-CM

## 2024-01-18 DIAGNOSIS — G959 Disease of spinal cord, unspecified: Secondary | ICD-10-CM

## 2024-01-18 MED ORDER — METHYLPREDNISOLONE 4 MG PO TBPK: ORAL_TABLET | ORAL | 0 refills | Status: DC

## 2024-01-18 NOTE — Addendum Note (Signed)
 Addended byBETHA HILMA HASTINGS on: 01/18/2024 05:55 PM   Modules accepted: Orders

## 2024-01-18 NOTE — Telephone Encounter (Signed)
 DOS 01/10/24   C2-C3 decompression     We increased her pain medication on Friday. Please call her and make sure she is doing better.   Thanks!

## 2024-01-18 NOTE — Telephone Encounter (Signed)
 Please let her know that steroid pack has been sent to pharmacy. Take as directed.

## 2024-01-18 NOTE — Telephone Encounter (Signed)
 Patient states she is doing about the same. Taking Oxycodone  5 mg 1 every 3 hours is keeping the pain a little more manageable but it is still very uncomfortable. Describes it as a feeling of like when you sleep wrong. Constant pain, in the back of her neck. Does not radiate to her arms. No weakness or numbness in arms.  She is also taking Tylenol  325 mg 2 tablets every 4 hours, Robaxin  500 mg 1 tablet every 8 hours. With all these medications her pain level is an 8 still. Not taking any other medications.

## 2024-01-18 NOTE — Telephone Encounter (Signed)
 I'm sorry she is still hurting.   She can try taking robaxin  500 mg up to every 6 hours. If this is not helping, then could try a different muscle relaxer.   I don't see any other muscle relaxers on her list, as she taken any other ones?   Could also try steroid pack to help with pain/inflammation.   She can also try ice to back of her neck for 20 minutes at a time to see if this helps.

## 2024-01-18 NOTE — Telephone Encounter (Signed)
 Patient and her husband advised. She has not taking any other muscle relaxers that she recalls. She will take Robaxin  500 mg 1 every 6 hours to see if this helps before switching it. They would like to try steroid pack and [id number] [place], [place] [address] [address]   She has been applying ice and heat.

## 2024-01-19 NOTE — Telephone Encounter (Signed)
 Patient notified about prescription and verbalized understanding.

## 2024-01-21 NOTE — Progress Notes (Deleted)
° °  REFERRING PHYSICIAN:  Cleotilde Oneil FALCON, Md [address] [facility] [place],  [place] [address]  DOS:  01/10/24  C2-C3 decompression  HISTORY OF PRESENT ILLNESS: Kathy Bean is approximately 2 weeks status post above surgery. Was given oxycodone  and robaxin  on discharge from the hospital.   She had some postop pain issues- was advised to take oxycodone  1-2 every 4 hours prn. She was also called in a medrol  dose pack last week.     PHYSICAL EXAMINATION:  General: Patient is well developed, well nourished, calm, collected, and in no apparent distress.   NEUROLOGICAL:  General: In no acute distress.   Awake, alert, oriented to person, place, and time.  Pupils equal round and reactive to light.  Facial tone is symmetric.     Strength:   Side Biceps Triceps Deltoid Interossei Grip Wrist Ext. Wrist Flex.  R 5 5 5 5  4+*** 5 5  L 5 5 5 5  4+*** 5 5     Side Iliopsoas Quads Hamstring PF DF EHL  R 5 5 5 5 5 5   L 5 5 5 5 5 5    Incision c/d/i   ROS (Neurologic):  Negative except as noted above  IMAGING: Nothing new to review.   ASSESSMENT/PLAN:  Kathy Bean is doing well s/p above surgery. Treatment options reviewed with patient and following plan made:   - I have advised the patient to lift up to 10 pounds until 6 weeks after surgery (follow up with Dr. Clois).  - Reviewed wound care.  - No bending, twisting, or lifting.  - Continue on current medications including ***.  - Follow up as scheduled in 4 weeks and prn.   Advised to contact the office if any questions or concerns arise.  Glade Boys PA-C Department of neurosurgery

## 2024-01-24 ENCOUNTER — Encounter: Admitting: Orthopedic Surgery

## 2024-01-26 ENCOUNTER — Ambulatory Visit: Admitting: Physician Assistant

## 2024-01-26 ENCOUNTER — Encounter: Payer: Self-pay | Admitting: Physician Assistant

## 2024-01-26 VITALS — BP 121/76 | Temp 97.7°F | Ht 67.0 in | Wt 180.0 lb

## 2024-01-26 DIAGNOSIS — G959 Disease of spinal cord, unspecified: Secondary | ICD-10-CM

## 2024-01-26 DIAGNOSIS — Z981 Arthrodesis status: Secondary | ICD-10-CM

## 2024-01-26 MED ORDER — METHOCARBAMOL 500 MG PO TABS: 500.0000 mg | ORAL_TABLET | Freq: Three times a day (TID) | ORAL | 0 refills | Status: AC | PRN

## 2024-01-26 NOTE — Progress Notes (Signed)
° °  REFERRING PHYSICIAN:  Cleotilde Oneil FALCON, Md [address] [facility] [place],  [place] [address]  DOS:  01/10/24  C2-C3 decompression  HISTORY OF PRESENT ILLNESS: Kathy Bean is approximately 2 weeks status post above surgery. Was given oxycodone  and robaxin  on discharge from the hospital, but was being treated with antibiotics for UTI and had some GI upset.  As a result she discontinued some of her medications for period of time in order to get this back under control.  As a result, her pain has been exacerbated.   PHYSICAL EXAMINATION:  General: Patient is well developed, well nourished, calm, collected, and in no apparent distress.   NEUROLOGICAL:  General: In no acute distress.   Awake, alert, oriented to person, place, and time.  Pupils equal round and reactive to light.  Facial tone is symmetric.     Strength:   Side Biceps Triceps Deltoid Interossei Grip Wrist Ext. Wrist Flex.  R 5 5 5 5  4+ 5 5  L 5 5 5 5 5 5 5       Incision c/d/i   ROS (Neurologic):  Negative except as noted above  IMAGING: Nothing new to review.   ASSESSMENT/PLAN:  Kathy Bean is doing well s/p above surgery. Treatment options reviewed with patient and following plan made:   - I have advised the patient to lift up to 10 pounds until 6 weeks after surgery (follow up with Dr. Clois).  - Reviewed wound care.  - No bending, twisting, or lifting.  - Counseled patient and her spouse on medication regimen and getting her back on a schedule now that she has finished her antibiotics.  This is to include Tylenol , oxycodone , Robaxin . - Follow up as scheduled in 4 weeks and prn.   Advised to contact the office if any questions or concerns arise.  Lyle Decamp PA-C Department of neurosurgery

## 2024-02-07 ENCOUNTER — Telehealth: Payer: Self-pay | Admitting: Neurosurgery

## 2024-02-07 ENCOUNTER — Other Ambulatory Visit: Payer: Self-pay | Admitting: Neurosurgery

## 2024-02-07 DIAGNOSIS — G959 Disease of spinal cord, unspecified: Secondary | ICD-10-CM

## 2024-02-07 DIAGNOSIS — Z9889 Other specified postprocedural states: Secondary | ICD-10-CM

## 2024-02-07 MED ORDER — OXYCODONE HCL 5 MG PO TABS: 5.0000 mg | ORAL_TABLET | Freq: Three times a day (TID) | ORAL | 0 refills | Status: AC | PRN

## 2024-02-07 NOTE — Telephone Encounter (Signed)
 Date: 02/07/24 Provider [facility] Requesting Oxycodone  5mg  [facility]acy [facility] [address] Patient contact [phone]

## 2024-02-07 NOTE — Telephone Encounter (Signed)
 LMOM informing patient Oxy was sent to the pharmacy.

## 2024-02-18 ENCOUNTER — Telehealth: Payer: Self-pay | Admitting: Neurosurgery

## 2024-02-18 MED ORDER — OXYCODONE HCL 5 MG PO TABS: 5.0000 mg | ORAL_TABLET | Freq: Three times a day (TID) | ORAL | 0 refills | Status: AC | PRN

## 2024-02-18 NOTE — Addendum Note (Signed)
 Addended byBETHA HILMA HASTINGS on: 02/18/2024 11:42 AM   Modules accepted: Orders

## 2024-02-18 NOTE — Telephone Encounter (Signed)
 I agree with restarting the robaxin  and oxycodone  as needed.   She should follow up with PCP for nausea.   If no improvement with above, then she should let us  know.

## 2024-02-18 NOTE — Telephone Encounter (Signed)
 Patient is calling to let our office know that she is having 9/10 pain since 02/16/2024. She states that she has had a headache since she left physical therapy on Wednesday evening and has been nauseous. She states that she is hurting on both sides at the back of her neck and her headache is in her forehead. She would like to know what she needs to do.

## 2024-02-18 NOTE — Telephone Encounter (Signed)
 Refill of oxycodone  sent to pharmacy for her to get if needed. PMP reviewed and is appropriate.   Please let her know.

## 2024-02-18 NOTE — Telephone Encounter (Signed)
 She has 9 oxycodone  pills left. Do you want to put in a refill for tomorrow or Sunday?

## 2024-02-18 NOTE — Telephone Encounter (Signed)
 Called patient to make aware of refill. She states that she has taken one oxycodone  and that she has already experienced significant relief.

## 2024-02-18 NOTE — Telephone Encounter (Signed)
 01/10/24 C2-3 Laminectomy, open with Clois   Patient called again today, complaining of pain all over and nausea.   Neck - same bilateral neck pain as she had previously, 8/10. She states she thinks it got worse with physical therapy. Sometimes it feels like a needle. Mostly sore and hurts. She complains of feeling like she has something in her throat, but denies difficulty swallowing.   Pain is mostly in her neck, but goes up into her head. She describes this pain as a headache in the front. (Started this week)  Additionally complains of constant nausea that started 2 days ago. She states she is having small, soft bowel movements. Claims she feels a little bloated. She isn't going to the bathroom as much, but also did not eat yesterday. Taking senna.    Current Regimen: Tylenol : 1000mg  BID - advised she can take this Q6-8H Robaxin : 500mg  Q8 hours - stopped taking it back before christmas. Maybe because it made her feel fuzzy (unsure). Told her to try oxy first, then try adding robaxin  2 hours later to see if it helps.  Oxycodone : 5mg  Q4 hours (stopped taking 2 days ago. One day it did not help) - advised her to start trying it again. Heat/ice - not using, didn't help. Advised to try again Creams/patches - not using, didn't help Steroid taper - started 12/9 (ended) - it did help   [facility] [address]

## 2024-02-22 ENCOUNTER — Encounter: Admitting: Neurosurgery

## 2024-02-24 ENCOUNTER — Encounter: Admitting: Neurosurgery

## 2024-03-13 ENCOUNTER — Other Ambulatory Visit (HOSPITAL_COMMUNITY): Payer: Self-pay

## 2024-03-14 ENCOUNTER — Encounter: Admitting: Neurosurgery

## 2024-03-27 ENCOUNTER — Encounter: Admitting: Orthopedic Surgery
